# Patient Record
Sex: Male | Born: 1965 | Hispanic: No | Smoking: Current every day smoker
Health system: Southern US, Community
[De-identification: ages and names within clinical notes are randomized; demographics above are authoritative.]

## PROBLEM LIST (undated history)

## (undated) DIAGNOSIS — E785 Hyperlipidemia, unspecified: Secondary | ICD-10-CM

## (undated) DIAGNOSIS — K7460 Unspecified cirrhosis of liver: Secondary | ICD-10-CM

## (undated) DIAGNOSIS — F1020 Alcohol dependence, uncomplicated: Secondary | ICD-10-CM

## (undated) HISTORY — PX: NO PAST SURGERIES: SHX2092

## (undated) HISTORY — DX: Unspecified cirrhosis of liver: K74.60

---

## 2010-10-19 ENCOUNTER — Inpatient Hospital Stay (HOSPITAL_COMMUNITY)
Admission: EM | Admit: 2010-10-19 | Discharge: 2010-10-23 | DRG: 897 | Disposition: A | Discharge status: Home / Self Care | Payer: Self-pay | Attending: Internal Medicine | Admitting: Internal Medicine

## 2010-10-19 ENCOUNTER — Emergency Department (HOSPITAL_COMMUNITY): Payer: Self-pay

## 2010-10-19 DIAGNOSIS — R7402 Elevation of levels of lactic acid dehydrogenase (LDH): Secondary | ICD-10-CM | POA: Diagnosis present

## 2010-10-19 DIAGNOSIS — R569 Unspecified convulsions: Secondary | ICD-10-CM | POA: Diagnosis present

## 2010-10-19 DIAGNOSIS — E876 Hypokalemia: Secondary | ICD-10-CM | POA: Diagnosis present

## 2010-10-19 DIAGNOSIS — F10239 Alcohol dependence with withdrawal, unspecified: Principal | ICD-10-CM | POA: Diagnosis present

## 2010-10-19 DIAGNOSIS — R7401 Elevation of levels of liver transaminase levels: Secondary | ICD-10-CM | POA: Diagnosis present

## 2010-10-19 DIAGNOSIS — E871 Hypo-osmolality and hyponatremia: Secondary | ICD-10-CM | POA: Diagnosis present

## 2010-10-19 DIAGNOSIS — F10939 Alcohol use, unspecified with withdrawal, unspecified: Principal | ICD-10-CM | POA: Diagnosis present

## 2010-10-19 DIAGNOSIS — R000 Tachycardia, unspecified: Secondary | ICD-10-CM | POA: Diagnosis present

## 2010-10-19 DIAGNOSIS — F1020 Alcohol dependence, uncomplicated: Secondary | ICD-10-CM | POA: Diagnosis present

## 2010-10-19 DIAGNOSIS — F17200 Nicotine dependence, unspecified, uncomplicated: Secondary | ICD-10-CM | POA: Diagnosis present

## 2010-10-19 LAB — DIFFERENTIAL
Basophils Absolute: 0 10*3/uL (ref 0.0–0.1)
Eosinophils Absolute: 0.1 10*3/uL (ref 0.0–0.7)
Eosinophils Relative: 1 % (ref 0–5)
Lymphs Abs: 1.3 10*3/uL (ref 0.7–4.0)
Neutrophils Relative %: 66 % (ref 43–77)

## 2010-10-19 LAB — COMPREHENSIVE METABOLIC PANEL
ALT: 70 U/L — ABNORMAL HIGH (ref 0–53)
AST: 63 U/L — ABNORMAL HIGH (ref 0–37)
Albumin: 4.1 g/dL (ref 3.5–5.2)
Alkaline Phosphatase: 63 U/L (ref 39–117)
CO2: 29 mEq/L (ref 19–32)
Chloride: 82 mEq/L — ABNORMAL LOW (ref 96–112)
GFR calc non Af Amer: >60 mL/min (ref >60)
Potassium: 3.2 mEq/L — ABNORMAL LOW (ref 3.5–5.1)
Sodium: 125 mEq/L — ABNORMAL LOW (ref 135–145)
Total Bilirubin: 0.9 mg/dL (ref 0.3–1.2)

## 2010-10-19 LAB — RAPID URINE DRUG SCREEN, HOSP PERFORMED
Amphetamines: NOT DETECTED
Barbiturates: NOT DETECTED
Opiates: NOT DETECTED
Tetrahydrocannabinol: NOT DETECTED

## 2010-10-19 LAB — CBC
MCV: 90.3 fL (ref 78.0–100.0)
Platelets: 176 10*3/uL (ref 150–400)
RBC: 4.34 MIL/uL (ref 4.22–5.81)
RDW: 13 % (ref 11.5–15.5)
WBC: 8.2 10*3/uL (ref 4.0–10.5)

## 2010-10-20 LAB — CBC
HCT: 40.3 % (ref 39.0–52.0)
MCH: 32.6 pg (ref 26.0–34.0)
MCV: 91.2 fL (ref 78.0–100.0)
RDW: 13.4 % (ref 11.5–15.5)
WBC: 7.6 10*3/uL (ref 4.0–10.5)

## 2010-10-20 LAB — OSMOLALITY: Osmolality: 255 mOsm/kg — ABNORMAL LOW (ref 275–300)

## 2010-10-20 LAB — COMPREHENSIVE METABOLIC PANEL
Albumin: 3.6 g/dL (ref 3.5–5.2)
BUN: 7 mg/dL (ref 6–23)
CO2: 28 mEq/L (ref 19–32)
Calcium: 9.6 mg/dL (ref 8.4–10.5)
Chloride: 95 mEq/L — ABNORMAL LOW (ref 96–112)
Creatinine, Ser: 0.58 mg/dL (ref 0.50–1.35)
GFR calc non Af Amer: >60 mL/min (ref >60)
Total Bilirubin: 0.7 mg/dL (ref 0.3–1.2)

## 2010-10-20 LAB — OSMOLALITY, URINE: Osmolality, Ur: 575 mOsm/kg (ref 390–1090)

## 2010-10-20 LAB — TSH: TSH: 0.701 u[IU]/mL (ref 0.350–4.500)

## 2010-10-20 LAB — LIPID PANEL: Cholesterol: 294 mg/dL — ABNORMAL HIGH (ref 0–200)

## 2010-10-20 LAB — MAGNESIUM: Magnesium: 2.3 mg/dL (ref 1.5–2.5)

## 2010-10-20 LAB — SODIUM: Sodium: 123 mEq/L — ABNORMAL LOW (ref 135–145)

## 2010-10-21 ENCOUNTER — Inpatient Hospital Stay (HOSPITAL_COMMUNITY): Payer: Self-pay

## 2010-10-21 LAB — COMPREHENSIVE METABOLIC PANEL
BUN: 6 mg/dL (ref 6–23)
CO2: 28 mEq/L (ref 19–32)
Chloride: 102 mEq/L (ref 96–112)
Creatinine, Ser: 0.54 mg/dL (ref 0.50–1.35)
GFR calc Af Amer: >60 mL/min (ref >60)
GFR calc non Af Amer: >60 mL/min (ref >60)
Glucose, Bld: 97 mg/dL (ref 70–99)
Total Bilirubin: 0.7 mg/dL (ref 0.3–1.2)

## 2010-10-21 LAB — CBC
HCT: 39.1 % (ref 39.0–52.0)
MCV: 94 fL (ref 78.0–100.0)
RBC: 4.16 MIL/uL — ABNORMAL LOW (ref 4.22–5.81)
WBC: 6.4 10*3/uL (ref 4.0–10.5)

## 2010-10-22 LAB — COMPREHENSIVE METABOLIC PANEL
ALT: 74 U/L — ABNORMAL HIGH (ref 0–53)
AST: 106 U/L — ABNORMAL HIGH (ref 0–37)
CO2: 30 mEq/L (ref 19–32)
Calcium: 9.9 mg/dL (ref 8.4–10.5)
Chloride: 98 mEq/L (ref 96–112)
GFR calc non Af Amer: >60 mL/min (ref >60)
Sodium: 136 mEq/L (ref 135–145)

## 2010-10-22 LAB — CBC
MCH: 31.8 pg (ref 26.0–34.0)
Platelets: 176 10*3/uL (ref 150–400)
RBC: 4.21 MIL/uL — ABNORMAL LOW (ref 4.22–5.81)
WBC: 7.4 10*3/uL (ref 4.0–10.5)

## 2010-10-23 LAB — HEPATITIS PANEL, ACUTE
Hep B C IgM: NEGATIVE
Hepatitis B Surface Ag: NEGATIVE

## 2010-11-07 NOTE — Discharge Summary (Signed)
NAMECHANSON, TEEMS NO.:  [id number]  MEDICAL RECORD NO.:  [id number]  LOCATION:  1503                         FACILITY:  [facility]  PHYSICIAN:  Kathlen Mody, MD       DATE OF BIRTH:  04/04/1965  DATE OF ADMISSION:  10/19/2010 DATE OF DISCHARGE:  10/23/2010                              DISCHARGE SUMMARY   PRIMARY CARE PHYSICIAN:  He had been assigned to an MD at [facility].  DISCHARGE DIAGNOSES: 1. Alcohol withdrawal. 2. 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 3. Hyponatremia. 4. Hypokalemia. 5. History of hyperlipidemia. 6. Tobacco abuse. 7. Alcohol abuse.  DISCHARGE MEDICATIONS: 1. Folic acid 1 tablet daily. 2. Lorazepam one 1 mg twice daily. 3. Multivitamin 1 tablet daily. 4. Nicotine patch. 5. Vitamin B1 1 tablet daily. 6. Ibuprofen as needed.  CONSULTATIONS:  None.  PERTINENT LABS:  On admission, the patient had a CBC done which was within normal limits.  Comprehensive metabolic panel was significant for sodium of 125, potassium of 3.2, glucose 146, and elevated AST and ALT. Alcohol level was less than 11.  Urine drug screen was negative.  Urine sodium was 123.  Serum osmolality 255.  TSH within normal limits.  MRSA screen negative.  Urine osmolality was 575.  Lipid profile showed an elevated LDL of 198. Hepatitis panel was negative.  Comprehensive metabolic panel on the day of discharge showed an elevated AST of 106 and ALT of 74.  CBC was within normal limits.  DIAGNOSTIC STUDIES:  The patient had a CT head without contrast and showed no significant intracranial abnormality.  Ultrasound of the abdomen showed fatty liver infiltration.  BRIEF HOSPITAL COURSE:  This is a 45 year old gentleman with a remote history of hyperlipidemia, who was admitted to the hospital for seizure activity. 1. Seizures, most likely secondary to alcohol withdrawal symptoms.     The patient was admitted to step down.  He was put on CIWA protocol     and his  electrolytes were repleted as needed.  He was also educated     about being noncompliant to alcohol.  A social worker consult was     called to provide resources for Alcohol Anonymous program. 2. Hyponatremia, resolved. 3. Hypokalemia, resolved. 4. Hyperlipidemia.  He had an elevated LDL of 195, but since his LFTs     were elevated, he was not started on any statins at this time.  He     was recommended to follow up with his PCP in about 2-3 weeks and     check his LFTs and start statins as needed. 5. Tobacco abuse.  He was educated about tobacco cessation, given a     nicotine patch during hospitalization and on discharge.  On the day of discharge, the patient's vitals were within normal limits. His exam was within normal limits.  He was discharged home and recommended to follow up with his PCP in about 2-3 weeks and also get LFTs at the time of the office visit.          ______________________________ Kathlen Mody, MD     VA/MEDQ  D:  11/06/2010  T:  11/07/2010  Job:  [id number]  Electronically Signed by Kathlen Mody MD on 11/07/2010  02:07:30 PM

## 2010-12-07 NOTE — H&P (Signed)
[name], [name] NO.:  [id number]  MEDICAL RECORD NO.:  [id number]  LOCATION:  WLED                         FACILITY:  [facility]  PHYSICIAN:  Baltazar Najjar, MD     DATE OF BIRTH:  1965-05-31  DATE OF ADMISSION:  10/19/2010 DATE OF DISCHARGE:                             HISTORY & PHYSICAL   PRIMARY CARE PHYSICIAN:  The patient is unassigned.  CODE STATUS:  Full code.  CHIEF COMPLAINT:  Seizure.  HISTORY OF PRESENT ILLNESS:  [name] is a 45 year old [place] man with no significant past medical history, who is a heavy EtOH drinker, who was brought into the ER today by his wife after he had an episode of seizure activity at home which lasted about 15 minutes.  She described it as generalized tonic-clonic associated with foaming.  No bowel or urinary incontinence.  However, he bit his tongue as per her.  No history of seizures in the past; however, the patient is a heavy drinker.  He used to drink one pint of vodka every day.  For the last couple of days, he had been cutting down on his drinking and he took only a couple of beers today which is nothing for him.  He started having shaking and then progressing to seizure activity which was once at home.  No recurrence while he is in the ED.  The patient was found to be tachycardic in the ER, his heart rate was between 108 to 113 and he was in EtOH withdrawal.  He was placed on CIWA protocol and we were called to see him for admission and workup.  Other than that, there is no fever, no chills.  No cough or shortness of breath.  No abdominal pain.  No change in his bowel habits.  No dysuria, or any other complaints.  PAST MEDICAL HISTORY:  Remote history of hyperlipidemia, currently not on any medication.  SURGICAL HISTORY:  None.  SOCIAL HISTORY:  He is married, originally from [place].  Lives with his wife.  Drinks alcohol heavily, mainly vodka for at least 5 to 6 years. Smokes cigarettes about a pack a  day for the last 5 to 6 years.  Denies any illicit drug use.  ALLERGIES:  No known drug allergies.  HOME MEDICATIONS:  None.  REVIEW OF SYSTEMS:  As above in the HPI.  PHYSICAL EXAMINATION:  VITAL SIGNS:  Blood pressure 147/94, heart rate of 108, temperature 98.7, O2 sat 97% on room air. GENERAL:  He is alert and slightly drowsy after he was given Ativan in the ED, not in acute distress. NECK:  Supple.  No JVD. LUNGS:  Clear to auscultation bilaterally. CARDIOVASCULAR:  S1, S2, regular rhythm and rate. ABDOMEN:  Soft, nontender.  Bowel sounds heard normal. EXTREMITIES:  No pedal edema.  He does have tremors in his hands.  No active seizures.  LABORATORY AND RADIOLOGIC DATA:  Urine drug screen:  None detected. EtOH level less than 11.  Sodium 125, potassium 3.2, BUN of 7, creatinine 0.47.  Albumin 4.1, calcium 9.6.  WBCs 8.2, hemoglobin 13.8, hematocrit 39.2, platelets 176. CT head showed no significant intracranial abnormality.  ASSESSMENT: 1. Alcohol withdrawal. 2. Seizure secondary to #1. 3. Hyponatremia.  4. Hypokalemia. 5. Remote history of hyperlipidemia. 6. Tobacco abuse.  PLAN: 1. The patient will be admitted to step-down unit for close     monitoring. 2. We will continue with CIWA protocol.  Replete electrolytes, monitor     magnesium and potassium level very closely and repeat as needed. 3. Social Worker will be consulted to provide him resources for     [employer]. 4. Will place him on nicotine patch and counsel on smoking cessation. 5. Check urine sodium, urine osmolality, serum osmolality.  Also will     check his TSH as well to workup his hyponatremia and will replete     his potassium. 6. Given his remote history of hyperlipidemia, will also order a lipid     panel. 7. The patient will need PCP assignment prior to discharge.          ______________________________ Baltazar Najjar, MD     SA/MEDQ  D:  10/19/2010  T:  10/19/2010   Job:  [id number]  Electronically Signed by Hannah Beat MD on 12/07/2010 07:17:17 PM

## 2011-03-14 ENCOUNTER — Inpatient Hospital Stay (HOSPITAL_COMMUNITY)
Admission: EM | Admit: 2011-03-14 | Discharge: 2011-03-17 | DRG: 896 | Disposition: A | Discharge status: Home / Self Care | Payer: Self-pay | Attending: Internal Medicine | Admitting: Internal Medicine

## 2011-03-14 ENCOUNTER — Encounter (HOSPITAL_COMMUNITY): Payer: Self-pay

## 2011-03-14 DIAGNOSIS — F17200 Nicotine dependence, unspecified, uncomplicated: Secondary | ICD-10-CM | POA: Diagnosis present

## 2011-03-14 DIAGNOSIS — R05 Cough: Secondary | ICD-10-CM | POA: Diagnosis not present

## 2011-03-14 DIAGNOSIS — R4182 Altered mental status, unspecified: Secondary | ICD-10-CM | POA: Diagnosis present

## 2011-03-14 DIAGNOSIS — G9340 Encephalopathy, unspecified: Secondary | ICD-10-CM

## 2011-03-14 DIAGNOSIS — R509 Fever, unspecified: Secondary | ICD-10-CM | POA: Diagnosis not present

## 2011-03-14 DIAGNOSIS — E876 Hypokalemia: Secondary | ICD-10-CM | POA: Diagnosis present

## 2011-03-14 DIAGNOSIS — Z720 Tobacco use: Secondary | ICD-10-CM

## 2011-03-14 DIAGNOSIS — F10239 Alcohol dependence with withdrawal, unspecified: Principal | ICD-10-CM | POA: Diagnosis present

## 2011-03-14 DIAGNOSIS — F22 Delusional disorders: Secondary | ICD-10-CM | POA: Diagnosis present

## 2011-03-14 DIAGNOSIS — F10939 Alcohol use, unspecified with withdrawal, unspecified: Principal | ICD-10-CM | POA: Diagnosis present

## 2011-03-14 DIAGNOSIS — F1020 Alcohol dependence, uncomplicated: Secondary | ICD-10-CM | POA: Diagnosis present

## 2011-03-14 DIAGNOSIS — F1011 Alcohol abuse, in remission: Secondary | ICD-10-CM

## 2011-03-14 DIAGNOSIS — R059 Cough, unspecified: Secondary | ICD-10-CM | POA: Diagnosis not present

## 2011-03-14 DIAGNOSIS — E785 Hyperlipidemia, unspecified: Secondary | ICD-10-CM | POA: Diagnosis present

## 2011-03-14 DIAGNOSIS — G9349 Other encephalopathy: Secondary | ICD-10-CM | POA: Diagnosis present

## 2011-03-14 HISTORY — DX: Alcohol dependence, uncomplicated: F10.20

## 2011-03-14 HISTORY — DX: Hyperlipidemia, unspecified: E78.5

## 2011-03-14 LAB — CBC
HCT: 39.7 % (ref 39.0–52.0)
Hemoglobin: 13.9 g/dL (ref 13.0–17.0)
RDW: 13.6 % (ref 11.5–15.5)
WBC: 5.9 10*3/uL (ref 4.0–10.5)

## 2011-03-14 LAB — AMMONIA: Ammonia: 26 umol/L (ref 11–60)

## 2011-03-14 MED ORDER — SODIUM CHLORIDE 0.9 % IV BOLUS (SEPSIS)
1000.0000 mL | Freq: Once | INTRAVENOUS | Status: AC
  Administered 2011-03-14: 1000 mL via INTRAVENOUS

## 2011-03-14 NOTE — ED Notes (Signed)
Pt was seen here in last few months w/ETOH withdrawal.  Apparently his prior information was listed under a social security number that was off by a number.   Pt states he has been very sleep deprived.   Family states he is seeing and hearing things that aren't there.

## 2011-03-14 NOTE — ED Notes (Signed)
Rectal temperature was not done, pt declined.

## 2011-03-14 NOTE — ED Notes (Signed)
Family will remain w/pt until he is back to a room.  Pt in NAD and is calm and cooperative at this time.

## 2011-03-15 ENCOUNTER — Encounter (HOSPITAL_COMMUNITY): Payer: Self-pay | Admitting: Family Medicine

## 2011-03-15 ENCOUNTER — Emergency Department (HOSPITAL_COMMUNITY): Payer: Self-pay

## 2011-03-15 DIAGNOSIS — F10239 Alcohol dependence with withdrawal, unspecified: Principal | ICD-10-CM | POA: Diagnosis present

## 2011-03-15 DIAGNOSIS — F10939 Alcohol use, unspecified with withdrawal, unspecified: Principal | ICD-10-CM | POA: Diagnosis present

## 2011-03-15 DIAGNOSIS — Z720 Tobacco use: Secondary | ICD-10-CM

## 2011-03-15 LAB — RAPID URINE DRUG SCREEN, HOSP PERFORMED
Amphetamines: NOT DETECTED
Barbiturates: NOT DETECTED
Benzodiazepines: NOT DETECTED
Tetrahydrocannabinol: NOT DETECTED

## 2011-03-15 LAB — BASIC METABOLIC PANEL
CO2: 26 mEq/L (ref 19–32)
Chloride: 102 mEq/L (ref 96–112)
Creatinine, Ser: 0.63 mg/dL (ref 0.50–1.35)
Glucose, Bld: 96 mg/dL (ref 70–99)

## 2011-03-15 LAB — COMPREHENSIVE METABOLIC PANEL
Albumin: 4.1 g/dL (ref 3.5–5.2)
Alkaline Phosphatase: 67 U/L (ref 39–117)
BUN: 9 mg/dL (ref 6–23)
Chloride: 94 mEq/L — ABNORMAL LOW (ref 96–112)
Potassium: 2.4 mEq/L — CL (ref 3.5–5.1)
Total Bilirubin: 0.6 mg/dL (ref 0.3–1.2)

## 2011-03-15 LAB — CBC
HCT: 38.4 % — ABNORMAL LOW (ref 39.0–52.0)
MCV: 88.7 fL (ref 78.0–100.0)
RDW: 13.6 % (ref 11.5–15.5)
WBC: 5.3 10*3/uL (ref 4.0–10.5)

## 2011-03-15 LAB — URINALYSIS, ROUTINE W REFLEX MICROSCOPIC
Bilirubin Urine: NEGATIVE
Glucose, UA: NEGATIVE mg/dL
Hgb urine dipstick: NEGATIVE
pH: 6.5 (ref 5.0–8.0)

## 2011-03-15 LAB — ETHANOL: Alcohol, Ethyl (B): <11 mg/dL (ref 0–11)

## 2011-03-15 MED ORDER — LORAZEPAM 2 MG/ML IJ SOLN
INTRAMUSCULAR | Status: AC
  Administered 2011-03-15: 3 mg via INTRAVENOUS
  Filled 2011-03-15: qty 2

## 2011-03-15 MED ORDER — ADULT MULTIVITAMIN W/MINERALS CH
1.0000 | ORAL_TABLET | Freq: Every day | ORAL | Status: DC
  Administered 2011-03-15 – 2011-03-17 (×3): 1 via ORAL
  Filled 2011-03-15 (×3): qty 1

## 2011-03-15 MED ORDER — PNEUMOCOCCAL VAC POLYVALENT 25 MCG/0.5ML IJ INJ
0.5000 mL | INJECTION | INTRAMUSCULAR | Status: AC
  Administered 2011-03-16: 0.5 mL via INTRAMUSCULAR
  Filled 2011-03-15: qty 0.5

## 2011-03-15 MED ORDER — LORAZEPAM 2 MG/ML IJ SOLN
3.0000 mg | Freq: Once | INTRAMUSCULAR | Status: AC
  Administered 2011-03-15: 3 mg via INTRAVENOUS

## 2011-03-15 MED ORDER — INFLUENZA VIRUS VACC SPLIT PF IM SUSP
0.5000 mL | INTRAMUSCULAR | Status: AC
  Administered 2011-03-16: 0.5 mL via INTRAMUSCULAR
  Filled 2011-03-15: qty 0.5

## 2011-03-15 MED ORDER — POTASSIUM CHLORIDE CRYS ER 20 MEQ PO TBCR
40.0000 meq | EXTENDED_RELEASE_TABLET | Freq: Once | ORAL | Status: AC
  Administered 2011-03-15: 40 meq via ORAL
  Filled 2011-03-15: qty 2

## 2011-03-15 MED ORDER — LORAZEPAM 1 MG PO TABS
1.0000 mg | ORAL_TABLET | Freq: Four times a day (QID) | ORAL | Status: DC | PRN
  Administered 2011-03-15: 1 mg via ORAL
  Filled 2011-03-15: qty 1

## 2011-03-15 MED ORDER — VITAMIN B-1 100 MG PO TABS
100.0000 mg | ORAL_TABLET | Freq: Every day | ORAL | Status: DC
  Administered 2011-03-15 – 2011-03-17 (×3): 100 mg via ORAL
  Filled 2011-03-15 (×3): qty 1

## 2011-03-15 MED ORDER — CHLORDIAZEPOXIDE HCL 25 MG PO CAPS
25.0000 mg | ORAL_CAPSULE | Freq: Three times a day (TID) | ORAL | Status: DC
  Administered 2011-03-15 – 2011-03-17 (×8): 25 mg via ORAL
  Filled 2011-03-15 (×7): qty 1

## 2011-03-15 MED ORDER — LORAZEPAM 2 MG/ML IJ SOLN
2.0000 mg | Freq: Once | INTRAMUSCULAR | Status: AC
  Administered 2011-03-15: 2 mg via INTRAVENOUS

## 2011-03-15 MED ORDER — LORAZEPAM 2 MG/ML IJ SOLN
1.0000 mg | Freq: Four times a day (QID) | INTRAMUSCULAR | Status: DC | PRN
  Administered 2011-03-16: 1 mg via INTRAVENOUS
  Filled 2011-03-15 (×2): qty 1

## 2011-03-15 MED ORDER — MAGNESIUM SULFATE IN D5W 10-5 MG/ML-% IV SOLN
1.0000 g | Freq: Once | INTRAVENOUS | Status: AC
  Administered 2011-03-15: 1 g via INTRAVENOUS
  Filled 2011-03-15: qty 100

## 2011-03-15 MED ORDER — THIAMINE HCL 100 MG/ML IJ SOLN
100.0000 mg | Freq: Every day | INTRAMUSCULAR | Status: DC
  Filled 2011-03-15 (×2): qty 2

## 2011-03-15 MED ORDER — LORAZEPAM 1 MG PO TABS
1.0000 mg | ORAL_TABLET | Freq: Once | ORAL | Status: AC
  Administered 2011-03-15: 1 mg via ORAL
  Filled 2011-03-15: qty 1

## 2011-03-15 MED ORDER — FOLIC ACID 1 MG PO TABS
1.0000 mg | ORAL_TABLET | Freq: Every day | ORAL | Status: DC
  Administered 2011-03-15 – 2011-03-17 (×3): 1 mg via ORAL
  Filled 2011-03-15 (×3): qty 1

## 2011-03-15 MED ORDER — CHLORDIAZEPOXIDE HCL 25 MG PO CAPS
ORAL_CAPSULE | ORAL | Status: AC
  Filled 2011-03-15: qty 1

## 2011-03-15 MED ORDER — NICOTINE 14 MG/24HR TD PT24
14.0000 mg | MEDICATED_PATCH | Freq: Every day | TRANSDERMAL | Status: DC
  Administered 2011-03-15 – 2011-03-17 (×5): 14 mg via TRANSDERMAL
  Filled 2011-03-15 (×4): qty 1

## 2011-03-15 MED ORDER — LORAZEPAM 1 MG PO TABS
0.0000 mg | ORAL_TABLET | Freq: Four times a day (QID) | ORAL | Status: AC
  Administered 2011-03-15: 1 mg via ORAL
  Filled 2011-03-15 (×2): qty 1

## 2011-03-15 MED ORDER — LORAZEPAM 1 MG PO TABS: 0.0000 mg | ORAL_TABLET | Freq: Two times a day (BID) | ORAL | Status: DC

## 2011-03-15 MED ORDER — LORAZEPAM 2 MG/ML IJ SOLN
INTRAMUSCULAR | Status: AC
  Filled 2011-03-15: qty 1

## 2011-03-15 MED ORDER — ENOXAPARIN SODIUM 40 MG/0.4ML ~~LOC~~ SOLN
40.0000 mg | SUBCUTANEOUS | Status: DC
  Administered 2011-03-15 – 2011-03-17 (×3): 40 mg via SUBCUTANEOUS
  Filled 2011-03-15 (×3): qty 0.4

## 2011-03-15 NOTE — H&P (Signed)
PCP:   [name], MD, MD   Chief Complaint:  hallucination  HPI: A 46 year old gentleman who presents with auditory and visual hallucinations. Per his sister and mother who are at the bedside, this is been on going for approximately 2 days. He also has the tremors and some confusion. The patient has had the flu for the past 5 days, there is no report of any new medications. The patient was admitted here in August 2012 with a diagnosis of seizure secondary to alcohol abuse. At that point he admitted to vodka daily intake, currently the patient states he drinks only a few beers daily and he has not drank any in the last 5 days.  I was later told by nursing that his wife states he still drinks lots of vodka daily. There appears to be some family dynamic were she's not to allowed to say this to the patient or in front of his family. The family apparently has approached the nurses a several times and asking them not to say much about his alcohol intake.   Review of Systems: Positives bolded  anorexia, fever, weight loss,, vision loss, decreased hearing, hoarseness, chest pain, syncope, dyspnea on exertion, peripheral edema, balance deficits, hemoptysis, abdominal pain, melena, hematochezia, severe indigestion/heartburn, hematuria, incontinence, genital sores, muscle weakness, suspicious skin lesions, transient blindness, difficulty walking, depression, unusual weight change, abnormal bleeding, enlarged lymph nodes, angioedema, and breast masses.  Past Medical History: Past Medical History  Diagnosis Date  . Alcoholism   . Hyperlipemia    No past surgical history on file.  Medications: Prior to Admission medications   Not on File  none  Allergies:  No Known Allergies  Social History:  reports that he has been smoking.  He does not have any smokeless tobacco history on file. He reports that he drinks alcohol. He reports that he does not use illicit drugs.  Family History: Family  History  Problem Relation Age of Onset  . Coronary artery disease    . Diabetes type II      Physical Exam: Filed Vitals:   03/14/11 1834  BP: 141/93  Pulse: 107  Temp: 98.8 F (37.1 C)  TempSrc: Oral  Resp: 16  Height: 5\' 6"  (1.676 m)  Weight: 66.225 kg (146 lb)  SpO2: 100%    General:  Alert and oriented times three, well developed and nourished, no acute distress Eyes: PERRLA, pink conjunctiva, no scleral icterus ENT: Moist oral mucosa, neck supple, no thyromegaly Lungs: clear to ascultation, no wheeze, no crackles, no use of accessory muscles Cardiovascular: regular rate and rhythm, no regurgitation, no gallops, no murmurs. No carotid bruits, no JVD Abdomen: soft, positive BS, non-tender, non-distended, no organomegaly, not an acute abdomen GU: not examined Neuro: CN II - XII grossly intact, sensation intact Musculoskeletal: strength 5/5 all extremities, no clubbing, cyanosis or edema Skin: no rash, no subcutaneous crepitation, no decubitus Psych: alert and oriented but not focused   Labs on Admission:   [facility] 03/14/11 2322  NA 133*  K 2.4*  CL 94*  CO2 26  GLUCOSE 97  BUN 9  CREATININE 0.63  CALCIUM 9.6  MG --  PHOS --    Basename 03/14/11 2322  AST 69*  ALT 97*  ALKPHOS 67  BILITOT 0.6  PROT 7.7  ALBUMIN 4.1   No results found for this basename: LIPASE:2,AMYLASE:2 in the last 72 hours  Basename 03/14/11 2322  WBC 5.9  NEUTROABS --  HGB 13.9  HCT 39.7  MCV 88.0  PLT 173  Results for MUNEEB, VERAS (MRN [id number]) as of 03/15/2011 02:00  Ref. Range 10/19/2010 16:45 03/14/2011 23:22 03/15/2011 00:36  Alcohol, Ethyl (B) Latest Range: 0-11 mg/dL <40 <98   Amphetamines Latest Range: NONE DETECTED    NONE DETECTED  Barbiturates Latest Range: NONE DETECTED    NONE DETECTED  Benzodiazepines Latest Range: NONE DETECTED    NONE DETECTED  Opiates Latest Range: NONE DETECTED    NONE DETECTED  COCAINE Latest Range: NONE DETECTED    NONE DETECTED    Tetrahydrocannabinol Latest Range: NONE DETECTED    NONE DETECTED   No results found for this basename: CKTOTAL:3,CKMB:3,CKMBINDEX:3,TROPONINI:3 in the last 72 hours No components found with this basename: POCBNP:3 No results found for this basename: DDIMER:2 in the last 72 hours No results found for this basename: HGBA1C:2 in the last 72 hours No results found for this basename: CHOL:2,HDL:2,LDLCALC:2,TRIG:2,CHOLHDL:2,LDLDIRECT:2 in the last 72 hours No results found for this basename: TSH,T4TOTAL,FREET3,T3FREE,THYROIDAB in the last 72 hours No results found for this basename: VITAMINB12:2,FOLATE:2,FERRITIN:2,TIBC:2,IRON:2,RETICCTPCT:2 in the last 72 hours  Micro Results: No results found for this or any previous visit (from the past 240 hour(s)). Results for AERO, DRUMMONDS (MRN [id number]) as of 03/15/2011 02:00  Ref. Range 03/15/2011 00:36  Color, Urine Latest Range: YELLOW  YELLOW  APPearance Latest Range: CLEAR  CLEAR  Specific Gravity, Urine Latest Range: 1.005-1.030  1.007  pH Latest Range: 5.0-8.0  6.5  Glucose, UA Latest Range: NEGATIVE mg/dL NEGATIVE  Bilirubin Urine Latest Range: NEGATIVE  NEGATIVE  Ketones, ur Latest Range: NEGATIVE mg/dL TRACE (A)  Protein Latest Range: NEGATIVE mg/dL NEGATIVE  Urobilinogen, UA Latest Range: 0.0-1.0 mg/dL 1.0  Nitrite Latest Range: NEGATIVE  NEGATIVE  Leukocytes, UA Latest Range: NEGATIVE  NEGATIVE    Radiological Exams on Admission: No results found.  Assessment/Plan Present on Admission:  .Alcohol withdrawal ALCOHOL abuse  Admit to telemetry CIWA  protocol CT head per family request  Hyperlipidemia    full code DVT prophylaxis Team 2/Dr. Oleh Genin, Beverlee Wilmarth 03/15/2011, 1:31 AM

## 2011-03-15 NOTE — ED Notes (Signed)
Pt remains very agitated after 3mg  Ativan IV, obtained verbal order from Dr. Joneen Roach for 2mg  of Ativan IV.

## 2011-03-15 NOTE — ED Notes (Signed)
Unable to get morning labs at this time. Will get labs when able. RN aware

## 2011-03-15 NOTE — ED Provider Notes (Signed)
History     CSN: [id number]  Arrival date & time 03/14/11  4098   First MD Initiated Contact with Patient 03/14/11 2257      Chief Complaint  Patient presents with  . Delusional    x few days??  per the family. pt lives w/his wife who is at home w/small child who claims the same.  . Altered Mental Status    pt is alert to self but needs slight reorienting to place and time.      (Consider location/radiation/quality/duration/timing/severity/associated sxs/prior treatment) Patient is a 46 y.o. male presenting with altered mental status. The history is provided by the patient, the spouse and a relative.  Altered Mental Status Pertinent negatives include no chest pain, no abdominal pain, no headaches and no shortness of breath.   about 5 days ago developed nausea vomiting and diarrhea with associated fever. The symptoms resolved about 24 hours ago and he has since developed altered mental status with reported visual hallucinations. Patient has a strong history of alcohol use and has not had a drink in about 4 days. He has a history of a seizure from alcohol withdrawal a few months ago but denies any recent seizure activity. His wife bedside states that she has been with him continuously for last 4 days. Patient abdominal pain, chest pain or shortness of breath. No sick contacts. Symptoms moderate in severity. History of same with alcohol withdrawal. Symptoms continuous since onset and unchanged.  Past Medical History  Diagnosis Date  . Alcoholism     No past surgical history on file.  No family history on file.  History  Substance Use Topics  . Smoking status: Current Everyday Smoker -- 0.5 packs/day  . Smokeless tobacco: Not on file  . Alcohol Use: No     pt states last drink was 3 days ago      Review of Systems  Constitutional: Negative for fever and chills.  HENT: Negative for neck pain and neck stiffness.   Eyes: Negative for pain.  Respiratory: Negative for shortness  of breath.   Cardiovascular: Negative for chest pain, palpitations and leg swelling.  Gastrointestinal: Negative for abdominal pain.  Genitourinary: Negative for dysuria.  Musculoskeletal: Negative for back pain.  Skin: Negative for rash.  Neurological: Negative for seizures, speech difficulty and headaches.  Psychiatric/Behavioral: Positive for hallucinations and altered mental status.  All other systems reviewed and are negative.    Allergies  Review of patient's allergies indicates no known allergies.  Home Medications  No current outpatient prescriptions on file.  BP 141/93  Pulse 107  Temp(Src) 98.8 F (37.1 C) (Oral)  Resp 16  Ht 5\' 6"  (1.676 m)  Wt 146 lb (66.225 kg)  BMI 23.56 kg/m2  SpO2 100%  Physical Exam  Constitutional: He appears well-developed and well-nourished.  HENT:  Head: Normocephalic and atraumatic.  Eyes: Conjunctivae and EOM are normal. Pupils are equal, round, and reactive to light.  Neck: Trachea normal. Neck supple. No thyromegaly present.  Cardiovascular: Normal rate, regular rhythm, S1 normal, S2 normal and normal pulses.     No systolic murmur is present   No diastolic murmur is present  Pulses:      Radial pulses are 2+ on the right side, and 2+ on the left side.  Pulmonary/Chest: Effort normal and breath sounds normal. He has no wheezes. He has no rhonchi. He has no rales. He exhibits no tenderness.  Abdominal: Soft. Normal appearance and bowel sounds are normal. There is no tenderness.  There is no CVA tenderness and negative Murphy's sign.  Musculoskeletal:       BLE:s Calves nontender, no cords or erythema, negative Homans sign  Neurological: He is alert. He has normal strength. No cranial nerve deficit or sensory deficit. Coordination normal. GCS eye subscore is 4. GCS verbal subscore is 5. GCS motor subscore is 6.       No nystagmus. Mild upper extremity murmurs present  Skin: Skin is warm and dry. No rash noted. He is not diaphoretic.   Psychiatric: His speech is normal.    ED Course  Procedures (including critical care time)  Labs Reviewed  COMPREHENSIVE METABOLIC PANEL - Abnormal; Notable for the following:    Sodium 133 (*)    Potassium 2.4 (*)    Chloride 94 (*)    AST 69 (*)    ALT 97 (*)    All other components within normal limits  CBC  ETHANOL  AMMONIA  URINALYSIS, ROUTINE W REFLEX MICROSCOPIC  URINE RAPID DRUG SCREEN (HOSP PERFORMED)   IV fluids. Ativan. Labs obtained and reviewed as above. Potassium provided. Medicine consultation for hallucinations and altered mental status with history of alcohol withdrawal.. case discussed as above with triad hospitalist who agrees to evaluation and admit.   MDM   Hallucinations with history of alcohol withdrawal presentation concerning for the same. Medicine admit.        Sunnie Nielsen, MD 03/15/11 [phone]

## 2011-03-15 NOTE — Progress Notes (Signed)
At this time, we receive him from the E.D. With his mother in attendance.  He is able (with assistance for steadying) to ambulate to his bed.  He tells me he is comfortable, and his only request is for some cold water, which we obtain for him.  He states he is hungry, and we instruct him and his mother how to phone for his food preferences, which they complete.  He is oriented x 4 with some hesitancy in his verbal responses.  He is minimally irritable and shaky and is in no distress.

## 2011-03-15 NOTE — Progress Notes (Signed)
UR completed 

## 2011-03-15 NOTE — ED Notes (Signed)
Pt. Was notified that a UA was needed and Pt. Stated that he would try to void within the next 5 minutes and he would notify nursing staff.

## 2011-03-15 NOTE — Progress Notes (Signed)
[name] JYN:[id number] is a 46 y.o. male,  Outpatient Primary MD for the patient is [name], MD, MD  Chief Complaint  Patient presents with  . Delusional    x few days??  per the family. pt lives w/his wife who is at home w/small child who claims the same.  . Altered Mental Status    pt is alert to self but needs slight reorienting to place and time.          Subjective:   Ian Bazile today has, No headache, No chest pain, No abdominal pain - No Nausea, No new weakness tingling or numbness, No Cough - SOB.    Objective:   Filed Vitals:   03/15/11 0803 03/15/11 0935 03/15/11 1121 03/15/11 1335  BP: 118/52 126/78 121/75 116/75  Pulse: 100 109 111 102  Temp: 99 F (37.2 C)  99.3 F (37.4 C)   TempSrc: Oral  Oral   Resp: 17 14 16 16   Height:      Weight:      SpO2: 95% 96% 97%     Wt Readings from Last 3 Encounters:  03/14/11 66.225 kg (146 lb)    No intake or output data in the 24 hours ending 03/15/11 1445  Exam Awake Alert, Oriented *3, No new F.N deficits, Normal affect [place].AT,PERRAL Supple Neck,No JVD, No cervical lymphadenopathy appriciated.  Symmetrical Chest wall movement, Good air movement bilaterally, CTAB RRR,No Gallops,Rubs or new Murmurs, No Parasternal Heave +ve B.Sounds, Abd Soft, Non tender, No organomegaly appriciated, No rebound -guarding or rigidity. No Cyanosis, Clubbing or edema, No new Rash or bruise     Data Review  CBC  Lab 03/15/11 0605 03/14/11 2322  WBC 5.3 5.9  HGB 13.3 13.9  HCT 38.4* 39.7  PLT 147* 173  MCV 88.7 88.0  MCH 30.7 30.8  MCHC 34.6 35.0  RDW 13.6 13.6  LYMPHSABS -- --  MONOABS -- --  EOSABS -- --  BASOSABS -- --  BANDABS -- --    Chemistries   Lab 03/15/11 0605 03/14/11 2322  NA 136 133*  K 3.2* 2.4*  CL 102 94*  CO2 26 26  GLUCOSE 96 97  BUN 8 9  CREATININE 0.63 0.63  CALCIUM 8.9 9.6  MG 1.7 --  AST -- 69*  ALT -- 97*  ALKPHOS -- 67  BILITOT -- 0.6    ------------------------------------------------------------------------------------------------------------------ estimated creatinine clearance is 103 ml/min (by C-G formula based on Cr of 0.63). ------------------------------------------------------------------------------------------------------------------ No results found for this basename: HGBA1C:2 in the last 72 hours ------------------------------------------------------------------------------------------------------------------ No results found for this basename: CHOL:2,HDL:2,LDLCALC:2,TRIG:2,CHOLHDL:2,LDLDIRECT:2 in the last 72 hours ------------------------------------------------------------------------------------------------------------------ No results found for this basename: TSH,T4TOTAL,FREET3,T3FREE,THYROIDAB in the last 72 hours ------------------------------------------------------------------------------------------------------------------ No results found for this basename: VITAMINB12:2,FOLATE:2,FERRITIN:2,TIBC:2,IRON:2,RETICCTPCT:2 in the last 72 hours  Coagulation profile No results found for this basename: INR:5,PROTIME:5 in the last 168 hours  No results found for this basename: DDIMER:2 in the last 72 hours  Cardiac Enzymes No results found for this basename: CK:3,CKMB:3,TROPONINI:3,MYOGLOBIN:3 in the last 168 hours ------------------------------------------------------------------------------------------------------------------ No components found with this basename: POCBNP:3  Micro Results No results found for this or any previous visit (from the past 240 hour(s)).  Radiology Reports Ct Head Wo Contrast  03/15/2011  *RADIOLOGY REPORT*  Clinical Data: Altered mental status; hallucinations.  CT HEAD WITHOUT CONTRAST  Technique:  Contiguous axial images were obtained from the base of the skull through the vertex without contrast.  Comparison: CT of the head performed 10/19/2010  Findings: There is no evidence  of acute infarction, mass lesion, or intra- or extra-axial hemorrhage on CT.  The posterior fossa, including the cerebellum, brainstem and fourth ventricle, is within normal limits.  The third and lateral ventricles, and basal ganglia are unremarkable in appearance.  The cerebral hemispheres are symmetric in appearance, with normal gray- white differentiation.  No mass effect or midline shift is seen.  There is no evidence of fracture; visualized osseous structures are unremarkable in appearance.  The visualized portions of the orbits are within normal limits.  The paranasal sinuses and mastoid air cells are well-aerated.  No significant soft tissue abnormalities are seen.  IMPRESSION: Unremarkable noncontrast CT of the head.  Original Report Authenticated By: Tonia Ghent, M.D.    Scheduled Meds:   . chlordiazePOXIDE      . chlordiazePOXIDE  25 mg Oral TID  . enoxaparin  40 mg Subcutaneous Q24H  . folic acid  1 mg Oral Daily  . influenza  inactive virus vaccine  0.5 mL Intramuscular Tomorrow-1000  . LORazepam  2 mg Intravenous Once  . LORazepam  3 mg Intravenous Once  . LORazepam  0-4 mg Oral Q6H   Followed by  . LORazepam  0-4 mg Oral Q12H  . LORazepam  1 mg Oral Once  . magnesium sulfate 1 - 4 g bolus IVPB  1 g Intravenous Once  . mulitivitamin with minerals  1 tablet Oral Daily  . nicotine  14 mg Transdermal Daily  . pneumococcal 23 valent vaccine  0.5 mL Intramuscular Tomorrow-1000  . potassium chloride  40 mEq Oral Once  . potassium chloride  40 mEq Oral Once  . potassium chloride  40 mEq Oral Once  . sodium chloride  1,000 mL Intravenous Once  . thiamine  100 mg Oral Daily   Or  . thiamine  100 mg Intravenous Daily   Continuous Infusions:  PRN Meds:.LORazepam, LORazepam  Assessment & Plan   1. Alcohol withdrawal - stable on liibrium + CIWA protocol, CT head negative, symptom free continue Vitamins, counseled. Off day 4.   2. Low K replaced. Check in AM.   3. Tobacco  abuse - counseled.  Time spent 40 minutes in seeing, examining patient, and over half of the total time was spent in coordinating patient, mother & care on the floor or bedisde.   DVT Prophylaxis  Lovenox    See all Orders from today for further details    Leroy Sea M.D on 03/15/2011 at 2:45 PM  [facility] Office  [phone]

## 2011-03-15 NOTE — ED Notes (Addendum)
Pt began severely hallucinating, began pulling off leads and pulling at his IV. Dr. Joneen Roach notified, obtained verbal order for Ativan 3mg  IV

## 2011-03-16 ENCOUNTER — Inpatient Hospital Stay (HOSPITAL_COMMUNITY): Payer: Self-pay

## 2011-03-16 DIAGNOSIS — F1020 Alcohol dependence, uncomplicated: Secondary | ICD-10-CM

## 2011-03-16 DIAGNOSIS — F10239 Alcohol dependence with withdrawal, unspecified: Principal | ICD-10-CM

## 2011-03-16 LAB — BASIC METABOLIC PANEL
BUN: 8 mg/dL (ref 6–23)
Chloride: 97 mEq/L (ref 96–112)
GFR calc non Af Amer: >90 mL/min (ref >90)
Glucose, Bld: 113 mg/dL — ABNORMAL HIGH (ref 70–99)
Potassium: 3.2 mEq/L — ABNORMAL LOW (ref 3.5–5.1)

## 2011-03-16 LAB — CULTURE, BLOOD (ROUTINE X 2): Culture  Setup Time: 201301172053

## 2011-03-16 MED ORDER — LEVOFLOXACIN IN D5W 750 MG/150ML IV SOLN
750.0000 mg | INTRAVENOUS | Status: DC
  Administered 2011-03-16 – 2011-03-17 (×2): 750 mg via INTRAVENOUS
  Filled 2011-03-16 (×3): qty 150

## 2011-03-16 MED ORDER — GUAIFENESIN 100 MG/5ML PO SOLN
5.0000 mL | ORAL | Status: DC | PRN
  Administered 2011-03-16: 100 mg via ORAL

## 2011-03-16 MED ORDER — ACETAMINOPHEN 325 MG PO TABS
650.0000 mg | ORAL_TABLET | Freq: Three times a day (TID) | ORAL | Status: DC | PRN
  Administered 2011-03-16: 650 mg via ORAL
  Filled 2011-03-16: qty 2

## 2011-03-16 MED ORDER — ALBUTEROL SULFATE (5 MG/ML) 0.5% IN NEBU: 2.5000 mg | INHALATION_SOLUTION | RESPIRATORY_TRACT | Status: DC | PRN

## 2011-03-16 MED ORDER — GUAIFENESIN 100 MG/5ML PO SOLN
5.0000 mL | ORAL | Status: DC | PRN
  Filled 2011-03-16: qty 10

## 2011-03-16 NOTE — Consult Note (Signed)
Reason for Consult: Alcohol use and  delusional behavior Referring Physician: Dr. Melony Overly Hilgert is an 46 y.o. male.  HPI: Family members noticed that this patient was not saying things that made sense and he has been drinking alcohol.   Past Medical History  Diagnosis Date  . Alcoholism   . Hyperlipemia     Past Surgical History  Procedure Date  . No past surgeries     Family History  Problem Relation Age of Onset  . Coronary artery disease    . Diabetes type II      Social History:  reports that he has been smoking Cigarettes.  He has been smoking about .5 packs per day. He has never used smokeless tobacco. He reports that he drinks alcohol. He reports that he does not use illicit drugs.  Allergies: No Known Allergies  Medications: I have reviewed the patient's current medications.  Results for orders placed during the hospital encounter of 03/14/11 (from the past 48 hour(s))  CBC     Status: Normal   Collection Time   03/14/11 11:22 PM      Component Value Range Comment   WBC 5.9  4.0 - 10.5 (K/uL)    RBC 4.51  4.22 - 5.81 (MIL/uL)    Hemoglobin 13.9  13.0 - 17.0 (g/dL)    HCT 40.9  81.1 - 91.4 (%)    MCV 88.0  78.0 - 100.0 (fL)    MCH 30.8  26.0 - 34.0 (pg)    MCHC 35.0  30.0 - 36.0 (g/dL)    RDW 78.2  95.6 - 21.3 (%)    Platelets 173  150 - 400 (K/uL)   COMPREHENSIVE METABOLIC PANEL     Status: Abnormal   Collection Time   03/14/11 11:22 PM      Component Value Range Comment   Sodium 133 (*) 135 - 145 (mEq/L)    Potassium 2.4 (*) 3.5 - 5.1 (mEq/L)    Chloride 94 (*) 96 - 112 (mEq/L)    CO2 26  19 - 32 (mEq/L)    Glucose, Bld 97  70 - 99 (mg/dL)    BUN 9  6 - 23 (mg/dL)    Creatinine, Ser 0.86  0.50 - 1.35 (mg/dL)    Calcium 9.6  8.4 - 10.5 (mg/dL)    Total Protein 7.7  6.0 - 8.3 (g/dL)    Albumin 4.1  3.5 - 5.2 (g/dL)    AST 69 (*) 0 - 37 (U/L)    ALT 97 (*) 0 - 53 (U/L)    Alkaline Phosphatase 67  39 - 117 (U/L)    Total Bilirubin 0.6  0.3 -  1.2 (mg/dL)    GFR calc non Af Amer >90  >90 (mL/min)    GFR calc Af Amer >90  >90 (mL/min)   ETHANOL     Status: Normal   Collection Time   03/14/11 11:22 PM      Component Value Range Comment   Alcohol, Ethyl (B) <11  0 - 11 (mg/dL)   AMMONIA     Status: Normal   Collection Time   03/14/11 11:22 PM      Component Value Range Comment   Ammonia 26  11 - 60 (umol/L)   URINALYSIS, ROUTINE W REFLEX MICROSCOPIC     Status: Abnormal   Collection Time   03/15/11 12:36 AM      Component Value Range Comment   Color, Urine YELLOW  YELLOW  APPearance CLEAR  CLEAR     Specific Gravity, Urine 1.007  1.005 - 1.030     pH 6.5  5.0 - 8.0     Glucose, UA NEGATIVE  NEGATIVE (mg/dL)    Hgb urine dipstick NEGATIVE  NEGATIVE     Bilirubin Urine NEGATIVE  NEGATIVE     Ketones, ur TRACE (*) NEGATIVE (mg/dL)    Protein, ur NEGATIVE  NEGATIVE (mg/dL)    Urobilinogen, UA 1.0  0.0 - 1.0 (mg/dL)    Nitrite NEGATIVE  NEGATIVE     Leukocytes, UA NEGATIVE  NEGATIVE  MICROSCOPIC NOT DONE ON URINES WITH NEGATIVE PROTEIN, BLOOD, LEUKOCYTES, NITRITE, OR GLUCOSE <1000 mg/dL.  URINE RAPID DRUG SCREEN (HOSP PERFORMED)     Status: Normal   Collection Time   03/15/11 12:36 AM      Component Value Range Comment   Opiates NONE DETECTED  NONE DETECTED     Cocaine NONE DETECTED  NONE DETECTED     Benzodiazepines NONE DETECTED  NONE DETECTED     Amphetamines NONE DETECTED  NONE DETECTED     Tetrahydrocannabinol NONE DETECTED  NONE DETECTED     Barbiturates NONE DETECTED  NONE DETECTED    BASIC METABOLIC PANEL     Status: Abnormal   Collection Time   03/15/11  6:05 AM      Component Value Range Comment   Sodium 136  135 - 145 (mEq/L)    Potassium 3.2 (*) 3.5 - 5.1 (mEq/L)    Chloride 102  96 - 112 (mEq/L)    CO2 26  19 - 32 (mEq/L)    Glucose, Bld 96  70 - 99 (mg/dL)    BUN 8  6 - 23 (mg/dL)    Creatinine, Ser 1.61  0.50 - 1.35 (mg/dL)    Calcium 8.9  8.4 - 10.5 (mg/dL)    GFR calc non Af Amer >90  >90 (mL/min)     GFR calc Af Amer >90  >90 (mL/min)   CBC     Status: Abnormal   Collection Time   03/15/11  6:05 AM      Component Value Range Comment   WBC 5.3  4.0 - 10.5 (K/uL)    RBC 4.33  4.22 - 5.81 (MIL/uL)    Hemoglobin 13.3  13.0 - 17.0 (g/dL)    HCT 09.6 (*) 04.5 - 52.0 (%)    MCV 88.7  78.0 - 100.0 (fL)    MCH 30.7  26.0 - 34.0 (pg)    MCHC 34.6  30.0 - 36.0 (g/dL)    RDW 40.9  81.1 - 91.4 (%)    Platelets 147 (*) 150 - 400 (K/uL)   MAGNESIUM     Status: Normal   Collection Time   03/15/11  6:05 AM      Component Value Range Comment   Magnesium 1.7  1.5 - 2.5 (mg/dL)   BASIC METABOLIC PANEL     Status: Abnormal   Collection Time   03/16/11  5:18 AM      Component Value Range Comment   Sodium 134 (*) 135 - 145 (mEq/L)    Potassium 3.2 (*) 3.5 - 5.1 (mEq/L)    Chloride 97  96 - 112 (mEq/L)    CO2 25  19 - 32 (mEq/L)    Glucose, Bld 113 (*) 70 - 99 (mg/dL)    BUN 8  6 - 23 (mg/dL)    Creatinine, Ser 7.82  0.50 - 1.35 (mg/dL)    Calcium  8.8  8.4 - 10.5 (mg/dL)    GFR calc non Af Amer >90  >90 (mL/min)    GFR calc Af Amer >90  >90 (mL/min)   MAGNESIUM     Status: Normal   Collection Time   03/16/11  5:18 AM      Component Value Range Comment   Magnesium 2.1  1.5 - 2.5 (mg/dL)     Ct Head Wo Contrast  03/15/2011  *RADIOLOGY REPORT*  Clinical Data: Altered mental status; hallucinations.  CT HEAD WITHOUT CONTRAST  Technique:  Contiguous axial images were obtained from the base of the skull through the vertex without contrast.  Comparison: CT of the head performed 10/19/2010  Findings: There is no evidence of acute infarction, mass lesion, or intra- or extra-axial hemorrhage on CT.  The posterior fossa, including the cerebellum, brainstem and fourth ventricle, is within normal limits.  The third and lateral ventricles, and basal ganglia are unremarkable in appearance.  The cerebral hemispheres are symmetric in appearance, with normal gray- white differentiation.  No mass effect or midline  shift is seen.  There is no evidence of fracture; visualized osseous structures are unremarkable in appearance.  The visualized portions of the orbits are within normal limits.  The paranasal sinuses and mastoid air cells are well-aerated.  No significant soft tissue abnormalities are seen.  IMPRESSION: Unremarkable noncontrast CT of the head.  Original Report Authenticated By: Tonia Ghent, M.D.   Dg Chest Port 1 View  03/16/2011  *RADIOLOGY REPORT*  Clinical Data: Cough, shortness of breath, fever  PORTABLE CHEST - 1 VIEW  Comparison: None.  Findings: The cardiac silhouette, mediastinum, pulmonary vasculature are within normal limits.  Both lungs are clear. There is no acute bony abnormality.  IMPRESSION: There is no evidence of acute cardiac or pulmonary process.  Original Report Authenticated By: Brandon Melnick, M.D.    Review of Systems  Unable to perform ROS: mental status change   Blood pressure 109/78, pulse 112, temperature 98.8 F (37.1 C), temperature source Oral, resp. rate 16, height 5\' 6"  (1.676 m), weight 66.225 kg (146 lb), SpO2 97.00%. Physical Exam  Assessment/Plan: This patient has a history of drinking alcohol as much as a sixpack and/or vodka. He has had 2 arrests for DUI 14 and 20 years ago more recently he stopped drinking for months ago because he had a seizure. That is the only seizure he and stopped drinking immediately after having the seizure. He takes no medication "the most ever take his Aleve". He started drinking alcohol again because he said he was having a lot of stress at home and at work because he can't he was a slow. He has his own Production designer, theatre/television/film homes and apartment complexes. The work has been slow and that creates financial pressure because his wife has not worked in for the past 7 years. She claims to have "fibromyalgia" he does not know if this has been diagnosed (her mother has fibromyalgia) he says she has a lot of medical problems because  she's taking pills all the time. He has a 47-year-old child when of the stressors is the family closeness which his wife does not enjoy. The family is very close to who who lived in staying and migrated to the unit. They all live in a close proximity to one another and see or talk with each other daily. His sisterMagjen is very close to her brother and she brought him to the hospital.  He has very long hair  and beard. He has good eye contact and spontaneous speech. He denies any suicidal ideation. He does not remember being confused. He says. Lying in his family or talk to him and told him saying strange things. All he knows is that for the past 5 days he had stomach virus. He was nauseous and could not keep any food in his system. He also had severe diarrhea. When he was brought in he was told that he was dehydrated. He is feeling better and still gets IV fluids  he says that her caused his confused thinking. He is alert oriented to person place date and situation. He recognizes that he is drinking beer when he has difficulties. He indicates that he is willing to go to [employer] and has a therapist to talk about some of these stressful situations he faces day-to-day. He denies suicidal homicidal ideation. He claims that he's never been the fact before.he admits to stress causing depression. It is most likely that this sudden dehydration and is most compatible with the delusions more than alcohol withdrawal.  RECOMMENDATION  1 referred this patient to AA meetings post discharge  2 referred this patient to to a therapist on discharge  3 couples therapy is also recommended. 4 consider abstinence from alcohol and work toward sobriety.  Anayiah Howden 03/16/2011, 4:28 PM

## 2011-03-16 NOTE — Progress Notes (Addendum)
Spoke with Pt's sister.  Per sister, Pt has just recently developed a px with ETOH.  She suspects that Pt and his wife aren't doing well and that he has turned to ETOH as a means of coping.  She asked that CSW include her in any psych MD recommendations.  Per sister, Pt has no hx of mental health issues/concerns.  He has never received inpt or outpt tx.  Discussed case with psych MD.  Psych MD suggesting that CSW provide Pt with outpt therapy information, as well as AA and substance abuse tx info.  Met with Pt and sister.  Discussed psych MD's recommendations and went over AA, ETOH tx facilities and therapy info.  Offered to make outpt appointments for Pt.  Pt declined, at this time.  Pt's sister accepted the information.  Pt and sister thanked CSW for time and information.  Providence Crosby, LCSWA Clinical Social Work [phone]

## 2011-03-16 NOTE — Progress Notes (Signed)
ANTIBIOTIC CONSULT NOTE - INITIAL  Pharmacy Consult for Levaquin Indication: Empiric (low grade temp, possible choking on vomitus 2 days ago at home)  No Known Allergies  Patient Measurements: Height: 5\' 6"  (167.6 cm) Weight: 146 lb (66.225 kg) IBW/kg (Calculated) : 63.8   Vital Signs: Temp: 98.8 F (37.1 C) (01/17 1152) Temp src: Oral (01/17 0535) BP: 109/78 mmHg (01/17 0535) Pulse Rate: 112  (01/17 0535) Intake/Output from previous day: 01/16 0701 - 01/17 0700 In: 240 [P.O.:240] Out: -  Intake/Output from this shift:    Labs:  Basename 03/16/11 0518 03/15/11 0605 03/14/11 2322  WBC -- 5.3 5.9  HGB -- 13.3 13.9  PLT -- 147* 173  LABCREA -- -- --  CREATININE 0.68 0.63 0.63   Estimated Creatinine Clearance: 103 ml/min (by C-G formula based on Cr of 0.68).  No results found for this basename: VANCOTROUGH:2,VANCOPEAK:2,VANCORANDOM:2,GENTTROUGH:2,GENTPEAK:2,GENTRANDOM:2,TOBRATROUGH:2,TOBRAPEAK:2,TOBRARND:2,AMIKACINPEAK:2,AMIKACINTROU:2,AMIKACIN:2, in the last 72 hours   Microbiology: No results found for this or any previous visit (from the past 720 hour(s)).  Medical History: Past Medical History  Diagnosis Date  . Alcoholism   . Hyperlipemia     Medications:  Scheduled:    . chlordiazePOXIDE      . chlordiazePOXIDE  25 mg Oral TID  . enoxaparin  40 mg Subcutaneous Q24H  . folic acid  1 mg Oral Daily  . influenza  inactive virus vaccine  0.5 mL Intramuscular Tomorrow-1000  . LORazepam  0-4 mg Oral Q6H   Followed by  . LORazepam  0-4 mg Oral Q12H  . magnesium sulfate 1 - 4 g bolus IVPB  1 g Intravenous Once  . mulitivitamin with minerals  1 tablet Oral Daily  . nicotine  14 mg Transdermal Daily  . pneumococcal 23 valent vaccine  0.5 mL Intramuscular Tomorrow-1000  . potassium chloride  40 mEq Oral Once  . thiamine  100 mg Oral Daily   Or  . thiamine  100 mg Intravenous Daily   Infusions:   PRN: acetaminophen, albuterol, guaiFENesin, LORazepam,  LORazepam, DISCONTD: guaiFENesin  Assessment: 46 yo M to start empiric Levaquin. BCx x2 have been sent. Pt reports he may have choked on vomit at home 2 days ago. (r/o asp PNA?)  Plan:  Levaquin 750mg  IV q24h  Annia Belt 03/16/2011,11:58 AM

## 2011-03-16 NOTE — Progress Notes (Signed)
Pt wife called and would like to speak with the Dr.  Her number is [phone].

## 2011-03-16 NOTE — Progress Notes (Signed)
Pt family request tylenlol for temp of 100.o Md on called sent text page.New orders noted.

## 2011-03-16 NOTE — Progress Notes (Signed)
Dr. Thedore Mins informed of pt wife  Request to have him call her with an update.

## 2011-03-16 NOTE — Progress Notes (Addendum)
[name] RUE:[id number]:[id number] is a 46 y.o. male,  Outpatient Primary MD for the patient is Ian Labella, MD, MD  Chief Complaint  Patient presents with  . Delusional    x few days??  per the family. pt lives w/his wife who is at home w/small child who claims the same.  . Altered Mental Status    pt is alert to self but needs slight reorienting to place and time.          Subjective:   [name] today has, No headache, No chest pain, No abdominal pain - No Nausea, No new weakness tingling or numbness, No Cough - SOB.    Objective:   Filed Vitals:   03/15/11 1420 03/15/11 2057 03/16/11 0535 03/16/11 0935  BP: 119/79 113/73 109/78   Pulse: 98 110 112   Temp: 100.6 F (38.1 C) 99.6 F (37.6 C) 99.9 F (37.7 C) 100 F (37.8 C)  TempSrc: Oral Oral Oral   Resp: 18 18 16    Height:      Weight:      SpO2: 98% 99% 97%     Wt Readings from Last 3 Encounters:  03/14/11 66.225 kg (146 lb)     Intake/Output Summary (Last 24 hours) at 03/16/11 1114 Last data filed at 03/16/11 0300  Gross per 24 hour  Intake    240 ml  Output      0 ml  Net    240 ml    Exam Awake Alert, Oriented *3, No new F.N deficits, Normal affect [place].AT,PERRAL Supple Neck,No JVD, No cervical lymphadenopathy appriciated.  Symmetrical Chest wall movement, Good air movement bilaterally, few rales RRR,No Gallops,Rubs or new Murmurs, No Parasternal Heave +ve B.Sounds, Abd Soft, Non tender, No organomegaly appriciated, No rebound -guarding or rigidity. No Cyanosis, Clubbing or edema, No new Rash or bruise     Data Review  CBC  Lab 03/15/11 0605 03/14/11 2322  WBC 5.3 5.9  HGB 13.3 13.9  HCT 38.4* 39.7  PLT 147* 173  MCV 88.7 88.0  MCH 30.7 30.8  MCHC 34.6 35.0  RDW 13.6 13.6  LYMPHSABS -- --  MONOABS -- --  EOSABS -- --  BASOSABS -- --  BANDABS -- --    Chemistries   Lab 03/16/11 0518 03/15/11 0605 03/14/11 2322  NA 134* 136 133*  K 3.2* 3.2* 2.4*  CL 97 102  94*  CO2 25 26 26   GLUCOSE 113* 96 97  BUN 8 8 9   CREATININE 0.68 0.63 0.63  CALCIUM 8.8 8.9 9.6  MG 2.1 1.7 --  AST -- -- 69*  ALT -- -- 97*  ALKPHOS -- -- 67  BILITOT -- -- 0.6   ------------------------------------------------------------------------------------------------------------------ estimated creatinine clearance is 103 ml/min (by C-G formula based on Cr of 0.68). ------------------------------------------------------------------------------------------------------------------ No results found for this basename: HGBA1C:2 in the last 72 hours ------------------------------------------------------------------------------------------------------------------ No results found for this basename: CHOL:2,HDL:2,LDLCALC:2,TRIG:2,CHOLHDL:2,LDLDIRECT:2 in the last 72 hours ------------------------------------------------------------------------------------------------------------------ No results found for this basename: TSH,T4TOTAL,FREET3,T3FREE,THYROIDAB in the last 72 hours ------------------------------------------------------------------------------------------------------------------ No results found for this basename: VITAMINB12:2,FOLATE:2,FERRITIN:2,TIBC:2,IRON:2,RETICCTPCT:2 in the last 72 hours  Coagulation profile No results found for this basename: INR:5,PROTIME:5 in the last 168 hours  No results found for this basename: DDIMER:2 in the last 72 hours  Cardiac Enzymes No results found for this basename: CK:3,CKMB:3,TROPONINI:3,MYOGLOBIN:3 in the last 168 hours ------------------------------------------------------------------------------------------------------------------ No components found with this basename: POCBNP:3  Micro Results No results found for this or any previous visit (from the past 240 hour(s)).  Radiology  Reports Ct Head Wo Contrast  03/15/2011  *RADIOLOGY REPORT*  Clinical Data: Altered mental status; hallucinations.  CT HEAD WITHOUT CONTRAST   Technique:  Contiguous axial images were obtained from the base of the skull through the vertex without contrast.  Comparison: CT of the head performed 10/19/2010  Findings: There is no evidence of acute infarction, mass lesion, or intra- or extra-axial hemorrhage on CT.  The posterior fossa, including the cerebellum, brainstem and fourth ventricle, is within normal limits.  The third and lateral ventricles, and basal ganglia are unremarkable in appearance.  The cerebral hemispheres are symmetric in appearance, with normal gray- white differentiation.  No mass effect or midline shift is seen.  There is no evidence of fracture; visualized osseous structures are unremarkable in appearance.  The visualized portions of the orbits are within normal limits.  The paranasal sinuses and mastoid air cells are well-aerated.  No significant soft tissue abnormalities are seen.  IMPRESSION: Unremarkable noncontrast CT of the head.  Original Report Authenticated By: Tonia Ghent, M.D.    Scheduled Meds:    . chlordiazePOXIDE      . chlordiazePOXIDE  25 mg Oral TID  . enoxaparin  40 mg Subcutaneous Q24H  . folic acid  1 mg Oral Daily  . influenza  inactive virus vaccine  0.5 mL Intramuscular Tomorrow-1000  . LORazepam  0-4 mg Oral Q6H   Followed by  . LORazepam  0-4 mg Oral Q12H  . magnesium sulfate 1 - 4 g bolus IVPB  1 g Intravenous Once  . mulitivitamin with minerals  1 tablet Oral Daily  . nicotine  14 mg Transdermal Daily  . pneumococcal 23 valent vaccine  0.5 mL Intramuscular Tomorrow-1000  . potassium chloride  40 mEq Oral Once  . thiamine  100 mg Oral Daily   Or  . thiamine  100 mg Intravenous Daily   Continuous Infusions:  PRN Meds:.acetaminophen, guaiFENesin, LORazepam, LORazepam, DISCONTD: guaiFENesin  Assessment & Plan   1. Alcohol withdrawal - stable on liibrium + CIWA protocol, CT head negative, symptom free continue Vitamins, counseled. Off day 4. He denies any delusions or  hallucinations, wife requested to talk to S work for family issues between her and in laws. Wife admant pt does get delusional at times, will have Psych see pt too.   2. Low K replaced & stable   3. Tobacco abuse - counseled.   4. Low grade temp with cough - says he might have chocked on Vomitus 2 days ago at home, few rales, will check B Cult, CXR, ABX now still treat as CAP. Nebs-o2 PRN.    DVT Prophylaxis  Lovenox    See all Orders from today for further details    Leroy Sea M.D on 03/16/2011 at 11:14 AM  [facility] Office  [phone]

## 2011-03-17 LAB — BASIC METABOLIC PANEL
BUN: 11 mg/dL (ref 6–23)
CO2: 29 mEq/L (ref 19–32)
Chloride: 99 mEq/L (ref 96–112)
Creatinine, Ser: 0.76 mg/dL (ref 0.50–1.35)
Glucose, Bld: 97 mg/dL (ref 70–99)

## 2011-03-17 MED ORDER — LEVOFLOXACIN 750 MG PO TABS: 750.0000 mg | ORAL_TABLET | Freq: Every day | ORAL | Status: AC

## 2011-03-17 MED ORDER — FOLIC ACID 1 MG PO TABS: 1.0000 mg | ORAL_TABLET | Freq: Every day | ORAL | Status: AC

## 2011-03-17 MED ORDER — ATIVAN 0.5 MG PO TABS: 0.5000 mg | ORAL_TABLET | Freq: Three times a day (TID) | ORAL | Status: AC

## 2011-03-17 MED ORDER — VITAMIN B-1 100 MG PO TABS: 100.0000 mg | ORAL_TABLET | Freq: Every day | ORAL | Status: AC

## 2011-03-17 NOTE — Progress Notes (Signed)
NURSING   MD notified that pt BP is 99/62, pulse rate 94, no s/s of distress, will continue to monitor the patient.

## 2011-03-17 NOTE — Discharge Summary (Signed)
[name], 46 y.o., DOB 1966/01/08, MRN [id number]. Admission date: 03/14/2011 Discharge Date 03/17/2011 Primary MD [name], MD, MD Admitting Physician Leroy Sea, MD  Admission Diagnosis  Encephalopathy acute [348.30] H/O alcohol abuse [305.03] not coherent delusional  Discharge Diagnosis   Principal Problem:  *Alcohol withdrawal Active Problems:  Tobacco abuse    Past Medical History  Diagnosis Date  . Alcoholism   . Hyperlipemia     Past Surgical History  Procedure Date  . No past surgeries      Hospital Course See H&P, Labs, Consult and Test reports for all details in brief, patient was admitted for Gastroenteritis causing Nausea and Vomiting (putting him in alcohol withdrawal)  with possible mild Aspiration Pneumonitis, was treated per CIWA protocol and now completely DT free, back to baseline, in no distress, will need repeat CBC, BMP,CXR in 3 days, he will go to his PCP office for it, was seen by Psych and S Work, Mother in room and updated per Pt request. Pt counseled again to abstain from smoking and Alcohol, he wanted outpt follow with AA and counseling if needed. Now has no fevers, no cough or SOB, no o2 need, wants to go home.   Consults  Psych, S Work  Significant Tests:  See full reports for all details     Ct Head Wo Contrast  03/15/2011  *RADIOLOGY REPORT*  Clinical Data: Altered mental status; hallucinations.  CT HEAD WITHOUT CONTRAST  Technique:  Contiguous axial images were obtained from the base of the skull through the vertex without contrast.  Comparison: CT of the head performed 10/19/2010  Findings: There is no evidence of acute infarction, mass lesion, or intra- or extra-axial hemorrhage on CT.  The posterior fossa, including the cerebellum, brainstem and fourth ventricle, is within normal limits.  The third and lateral ventricles, and basal ganglia are unremarkable in appearance.  The cerebral hemispheres are symmetric in appearance,  with normal gray- white differentiation.  No mass effect or midline shift is seen.  There is no evidence of fracture; visualized osseous structures are unremarkable in appearance.  The visualized portions of the orbits are within normal limits.  The paranasal sinuses and mastoid air cells are well-aerated.  No significant soft tissue abnormalities are seen.  IMPRESSION: Unremarkable noncontrast CT of the head.  Original Report Authenticated By: Tonia Ghent, M.D.   Dg Chest Port 1 View  03/16/2011  *RADIOLOGY REPORT*  Clinical Data: Cough, shortness of breath, fever  PORTABLE CHEST - 1 VIEW  Comparison: None.  Findings: The cardiac silhouette, mediastinum, pulmonary vasculature are within normal limits.  Both lungs are clear. There is no acute bony abnormality.  IMPRESSION: There is no evidence of acute cardiac or pulmonary process.  Original Report Authenticated By: Brandon Melnick, M.D.     Today   Subjective:   [name] today has no headache,no chest abdominal pain,no new weakness tingling or numbness, feels much better wants to go home today.    Objective:   Blood pressure 112/69, pulse 88, temperature 98.2 F (36.8 C), temperature source Oral, resp. rate 20, height 5\' 6"  (1.676 m), weight 66.225 kg (146 lb), SpO2 98.00%.  Intake/Output Summary (Last 24 hours) at 03/17/11 1554 Last data filed at 03/17/11 1300  Gross per 24 hour  Intake    480 ml  Output      2 ml  Net    478 ml    Exam Awake Alert, Oriented *3, No new F.N deficits, Normal affect  [place].AT,PERRAL Supple Neck,No JVD, No cervical lymphadenopathy appriciated.  Symmetrical Chest wall movement, Good air movement bilaterally, CTAB RRR,No Gallops,Rubs or new Murmurs, No Parasternal Heave +ve B.Sounds, Abd Soft, Non tender, No organomegaly appriciated, No rebound -guarding or rigidity. No Cyanosis, Clubbing or edema, No new Rash or bruise  Data Review      CBC w Diff: Lab Results  Component Value Date   WBC  5.3 03/15/2011   HGB 13.3 03/15/2011   HCT 38.4* 03/15/2011   PLT 147* 03/15/2011   LYMPHOPCT 16 10/19/2010   MONOPCT 17* 10/19/2010   EOSPCT 1 10/19/2010   BASOPCT 0 10/19/2010   CMP: Lab Results  Component Value Date   NA 136 03/17/2011   K 3.7 03/17/2011   CL 99 03/17/2011   CO2 29 03/17/2011   BUN 11 03/17/2011   CREATININE 0.76 03/17/2011   PROT 7.7 03/14/2011   ALBUMIN 4.1 03/14/2011   BILITOT 0.6 03/14/2011   ALKPHOS 67 03/14/2011   AST 69* 03/14/2011   ALT 97* 03/14/2011  .  Micro Results Recent Results (from the past 240 hour(s))  CULTURE, BLOOD (ROUTINE X 2)     Status: Normal (Preliminary result)   Collection Time   03/16/11 12:25 PM      Component Value Range Status Comment   Specimen Description BLOOD LEFT ARM   Final    Special Requests BOTTLES DRAWN AEROBIC AND ANAEROBIC 10CC   Final    Setup Time [id number]   Final    Culture     Final    Value:        BLOOD CULTURE RECEIVED NO GROWTH TO DATE CULTURE WILL BE HELD FOR 5 DAYS BEFORE ISSUING A FINAL NEGATIVE REPORT   Report Status PENDING   Incomplete   CULTURE, BLOOD (ROUTINE X 2)     Status: Normal (Preliminary result)   Collection Time   03/16/11 12:30 PM      Component Value Range Status Comment   Specimen Description BLOOD LEFT ARM   Final    Special Requests BOTTLES DRAWN AEROBIC AND ANAEROBIC 10CC   Final    Setup Time [id number]   Final    Culture     Final    Value:        BLOOD CULTURE RECEIVED NO GROWTH TO DATE CULTURE WILL BE HELD FOR 5 DAYS BEFORE ISSUING A FINAL NEGATIVE REPORT   Report Status PENDING   Incomplete      Discharge Instructions     Follow with Primary MD [name], MD, MD in 3 days   Get CBC, CMP, follow your Blood Cultures report checked  in 3 days by Primary MD and again as instructed by your Primary MD. Get a 2 view Chest X ray done next visit.  Get Medicines reviewed and adjusted.  Please request your Prim.MD to go over all Hospital Tests and Procedure/Radiological results at  the follow up, please get all Hospital records sent to your Prim MD by signing hospital release before you go home.  Activity: Fall precautions use walker/cane & assistance as needed  Diet: Heart Healthy, Aspiration precautions.  For Heart failure patients - Check your Weight same time everyday, if you gain over 2 pounds, or you develop in leg swelling, experience more shortness of breath or chest pain, call your Primary MD immediately. Follow Cardiac Low Salt Diet and 1.8 lit/day fluid restriction.  Disposition Home  If you experience worsening of your admission symptoms, develop shortness of breath, life threatening  emergency, suicidal or homicidal thoughts you must seek medical attention immediately by calling 911 or calling your MD immediately  if symptoms less severe.  You Must read complete instructions/literature along with all the possible adverse reactions/side effects for all the Medicines you take and that have been prescribed to you. Take any new Medicines after you have completely understood and accpet all the possible adverse reactions/side effects.   Do not drive if your were admitted for syncope or siezures until you have seen by Primary MD or a Neurologist and advised to drive.  Do not drive when taking Pain medications.    Do not take more than prescribed Pain, Sleep and Anxiety Medications  Special Instructions: If you have smoked or chewed Tobacco  in the last 2 yrs please stop smoking, stop any regular Alcohol  and or any Recreational drug use.  Wear Seat belts while driving.  Follow-up Information    Follow up with [facility]  on 04/04/2011. (Eligibility appointment AT 215PM)    Contact information:   [address] [place], [place] [phone]      Follow up with DR SHAW/[facility] on 05/05/2011. (Eligibility appt AT 215PM)    Contact information:   [address] [place], [place] [phone]      Follow up with [name], MD. Schedule an  appointment as soon as possible for a visit on 03/20/2011.   Contact information:   [address] [place] [address] [phone]          Discharge Medications   Medication List  As of 03/17/2011  3:54 PM   START taking these medications         ATIVAN 0.5 MG tablet   Generic drug: LORazepam   Take 1 tablet (0.5 mg total) by mouth every 8 (eight) hours. Please dispense 18 pills - Take 1 pill three times a day for 3 days, then Take 1 pill two times a day for 3 days, then Take 1 pill once a day for 3 days and stop.      folic acid 1 MG tablet   Commonly known as: FOLVITE   Take 1 tablet (1 mg total) by mouth daily.      levofloxacin 750 MG tablet   Commonly known as: LEVAQUIN   Take 1 tablet (750 mg total) by mouth daily.      thiamine 100 MG tablet   Commonly known as: VITAMIN B-1   Take 1 tablet (100 mg total) by mouth daily.          Where to get your medications    These are the prescriptions that you need to pick up.   You may get these medications from any pharmacy.         ATIVAN 0.5 MG tablet   folic acid 1 MG tablet   levofloxacin 750 MG tablet   thiamine 100 MG tablet             Total Time in preparing paper work, data evaluation and todays exam - 35 minutes  Leroy Sea M.D on 03/17/2011 at 3:54 PM  [facility] Office  [phone]

## 2011-03-17 NOTE — Progress Notes (Signed)
Pt d/c'd home with mother. Pt given d/c instructions and info regarding follow-up and prescriptions ordered by MD. Pt verbalized understanding of d/c instructions. No changes since am assessment.   Horatio Pel 03/17/2011

## 2011-11-21 ENCOUNTER — Ambulatory Visit (INDEPENDENT_AMBULATORY_CARE_PROVIDER_SITE_OTHER): Payer: PRIVATE HEALTH INSURANCE | Admitting: Family Medicine

## 2011-11-21 VITALS — BP 110/70 | HR 89 | Temp 98.8°F | Resp 14 | Ht 66.0 in | Wt 142.0 lb

## 2011-11-21 DIAGNOSIS — F419 Anxiety disorder, unspecified: Secondary | ICD-10-CM

## 2011-11-21 DIAGNOSIS — G4700 Insomnia, unspecified: Secondary | ICD-10-CM

## 2011-11-21 DIAGNOSIS — F411 Generalized anxiety disorder: Secondary | ICD-10-CM

## 2011-11-21 MED ORDER — ZOLPIDEM TARTRATE 10 MG PO TABS: ORAL_TABLET | ORAL | Status: DC

## 2011-11-21 NOTE — Progress Notes (Signed)
  Subjective:    Patient ID: [name], male    DOB: 1965-11-30, 46 y.o.   MRN: [id number]  HPI  Can't fall asleep as having racing thoughts, maybe only sleeping 2.5-3hrs/night. 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  Very stressed out due to separation.  Having chills/sweats at night - but no true night sweats. No appetite. Has lost about 10 lbs involuntarily.  Can't concentrate due to fatigue.  Sometimes does feel sad, still doing activities but not quite as much.  Is not having any panic attacks. No prior h/o depression or anxiety. Doesn't have any other medical problems and is very concentrate that everything is due to his fatigue. He refuses blood work today due to a needle phobia but will return if he continues to have symptoms.   Review of Systems  Constitutional: Positive for chills, activity change, appetite change, fatigue and unexpected weight change. Negative for fever and diaphoresis.  HENT: Negative for trouble swallowing, neck pain and neck stiffness.   Respiratory: Negative for shortness of breath.   Cardiovascular: Negative for palpitations.  Gastrointestinal: Negative for diarrhea and constipation.  Skin: Negative.   Neurological: Negative for tremors and weakness.  Psychiatric/Behavioral: Positive for disturbed wake/sleep cycle, dysphoric mood and decreased concentration. The patient is nervous/anxious.        Objective:   Physical Exam  Vitals reviewed. Constitutional: He is oriented to person, place, and time. He appears well-developed and well-nourished. No distress.  HENT:  Head: Normocephalic and atraumatic.  Eyes: Conjunctivae normal are normal. No scleral icterus.  Neck: Normal range of motion. Neck supple. No thyromegaly present.  Cardiovascular: Normal rate, regular rhythm, normal heart sounds and intact distal pulses.   Pulmonary/Chest: Effort normal and breath sounds normal. No respiratory  distress.  Abdominal: Soft. Bowel sounds are normal. He exhibits no distension. There is no tenderness.  Musculoskeletal: He exhibits no edema.  Lymphadenopathy:    He has no cervical adenopathy.  Neurological: He is alert and oriented to person, place, and time.  Skin: Skin is warm and dry. He is not diaphoretic. No erythema.  Psychiatric: He has a normal mood and affect. His behavior is normal.          Assessment & Plan:  1.  Insomnia - due to anxiety with current separation.  Will treat in short term w/ Ambien - warned of hangover effects w/ sedation and memory effects.  If sxs do not improved, RTC for further eval - gave warning signs for hyperthyroid. If sxs do improve, RTC in sev mos to determine if pt should be transitioned to alternative treatment (trazodone, ssri) vs just wean off as not a long term med. Pt agreeable to plan.

## 2011-11-21 NOTE — Progress Notes (Signed)
Reviewed and agree.

## 2011-11-21 NOTE — Patient Instructions (Signed)
Make sure you come back if you are noticing any of the below symptoms or if your anxiety or sleep does not improve.  Lets see you back in several months to determine the next steps for your treatment.  Hyperthyroidism The thyroid is a large gland located in the lower front part of your neck. The thyroid helps control metabolism. Metabolism is how your body uses food. It controls metabolism with the hormone thyroxine. When the thyroid is overactive, it produces too much hormone. When this happens, these following problems may occur:   Nervousness   Heat intolerance   Weight loss (in spite of increase food intake)   Diarrhea   Change in hair or skin texture   Palpitations (heart skipping or having extra beats)   Tachycardia (rapid heart rate)   Loss of menstruation (amenorrhea)   Shaking of the hands  CAUSES  Grave's Disease (the immune system attacks the thyroid gland). This is the most common cause.   Inflammation of the thyroid gland.   Tumor (usually benign) in the thyroid gland or elsewhere.   Excessive use of thyroid medications (both prescription and 'natural').   Excessive ingestion of Iodine.  DIAGNOSIS  To prove hyperthyroidism, your caregiver may do blood tests and ultrasound tests. Sometimes the signs are hidden. It may be necessary for your caregiver to watch this illness with blood tests, either before or after diagnosis and treatment. TREATMENT Short-term treatment There are several treatments to control symptoms. Drugs called beta blockers may give some relief. Drugs that decrease hormone production will provide temporary relief in many people. These measures will usually not give permanent relief. Definitive therapy There are treatments available which can be discussed between you and your caregiver which will permanently treat the problem. These treatments range from surgery (removal of the thyroid), to the use of radioactive iodine (destroys the thyroid by  radiation), to the use of antithyroid drugs (interfere with hormone synthesis). The first two treatments are permanent and usually successful. They most often require hormone replacement therapy for life. This is because it is impossible to remove or destroy the exact amount of thyroid required to make a person euthyroid (normal). HOME CARE INSTRUCTIONS  See your caregiver if the problems you are being treated for get worse. Examples of this would be the problems listed above. SEEK MEDICAL CARE IF: Your general condition worsens. MAKE SURE YOU:   Understand these instructions.   Will watch your condition.   Will get help right away if you are not doing well or get worse.  Document Released: 02/13/2005 Document Revised: 02/02/2011 Document Reviewed: 06/27/2006 [facility] Patient Information 2012 [place], [place].

## 2011-11-24 ENCOUNTER — Telehealth: Payer: Self-pay | Admitting: Radiology

## 2011-11-24 NOTE — Telephone Encounter (Signed)
Patient called he uses [facility] [address], states Ambien not working wants call back. Phone number  [phone]

## 2011-11-24 NOTE — Telephone Encounter (Signed)
I think a recheck would be best.  If pt is not happy about this please send to Dr. Clelia Croft.

## 2011-11-25 NOTE — Telephone Encounter (Signed)
Pt states that he started with 1/4 tab, then 1/2 tab, then finally 1 tab and he stated that it worked and [place] and he only has 15 tablets.  I advised him that this was only short term and that he should RTC if his symptoms returned to discuss a different alternative.  He stated why if its short term would Dr. Clelia Croft give him 5 refills.  So he basically wants a 30 day supply. Please advise

## 2011-11-25 NOTE — Telephone Encounter (Signed)
lmom to cb. 

## 2011-11-26 ENCOUNTER — Other Ambulatory Visit: Payer: Self-pay | Admitting: Family Medicine

## 2011-11-26 DIAGNOSIS — G4700 Insomnia, unspecified: Secondary | ICD-10-CM

## 2011-11-26 MED ORDER — TRAZODONE HCL 50 MG PO TABS: 25.0000 mg | ORAL_TABLET | Freq: Every evening | ORAL | Status: DC | PRN

## 2011-11-26 NOTE — Telephone Encounter (Signed)
It is not to be used every single night as he will become dependent upon it and it may stop working. It is to be used as needed - hopefully less than 1/2 the night's/mo - that's why he is only getting 15 tabs/mo.  If he needs something for every night, I would recommend trying something without as many potential side effects of tolerance, dependence, and addiction like Remus Loffler has.  I sent a prescription for trazodone to his pharmacy.  Take at night as needed for sleep. He doesn't HAVE to take it if not needed but it is safe to use regularly if it helps.

## 2011-11-27 NOTE — Telephone Encounter (Signed)
I

## 2011-11-27 NOTE — Telephone Encounter (Signed)
Noted, will wait to see pt in office to discuss further. If he wants to take Ambien every night for the short term - 1-2 mos only, I am fine w/ that. It is just not a permanent med.

## 2011-11-27 NOTE — Telephone Encounter (Signed)
Patient did not like my answer, he wants Ambien for use every night, he does not want the Trazodone, he is going to come in to see you. FYI

## 2011-12-07 ENCOUNTER — Telehealth: Payer: Self-pay

## 2011-12-07 NOTE — Telephone Encounter (Signed)
I called patient back and he is advised to come in for exam and possible labs.

## 2011-12-07 NOTE — Telephone Encounter (Signed)
PT CALLED WANTING TO SPEAK TO A CLINICAL STAFF MEMBER REGARDING "SEXUAL ISSUES" PLEASE CALL PT AT [phone]

## 2011-12-18 ENCOUNTER — Telehealth: Payer: Self-pay

## 2011-12-18 MED ORDER — ZOLPIDEM TARTRATE 10 MG PO TABS: 10.0000 mg | ORAL_TABLET | Freq: Every evening | ORAL | Status: DC | PRN

## 2011-12-18 NOTE — Telephone Encounter (Signed)
Looks like pt has a cpe sched with me in 1 mo. Fine to call in ambien 10mg  1 tab po qhs prn sleep. Disp 30, no refills. We will discuss further at his next OV.

## 2011-12-18 NOTE — Telephone Encounter (Signed)
Called in Rx for Cle[place] and notified pt that it has been done. Pt asked if we could give him samples for Viagra. I explained that we do not have samples and that I did not see a discussion about need for ED med in OV notes. Pt stated that he had talked w/Dr Clelia Croft about the problem when he was in and requests a Rx for viagra to be sent to pharmacy. I explained that Dr Clelia Croft will have to review this request and that she will not be back in the office until Wed afternoon. Dr. Clelia Croft, do you want to Rx viagra for pt?

## 2011-12-18 NOTE — Telephone Encounter (Signed)
Pt called and stated that the Trazodone did not help him sleep as well as the Ambien. He reports that the Ambien works pretty well if he takes one whole tablet Qhs, but does not work at 1/2 tab. Pt requests that we send new Rx into pharmacy so that he can get #30 for 30 days instead of just #15 since he needs the whole tablet. Pt is going out of the country tomorrow and would like this done tonight so that he will have it to take w/him. I let pt know that we would try to get it done for him if possible, but that if need be he can p/up his RF from the pharmacy for the #15 on orig Rx.

## 2011-12-19 NOTE — Telephone Encounter (Signed)
I don't recall discussing this w/ pt but ok to call in viagra 50mg  1 po 30 min prior to sexual activity disp 5, no refills.  He may try breaking them in half to make them last longer as this is a medium dose.  If he has any chest pain, call 911 and make sure he informs all medical providers of the use.  Before we continue this medicine, we need to do fasting lab work - first thing in the a.m. - to ensure he is not at increased risk for heart disease as well as his hormone levels.

## 2011-12-20 ENCOUNTER — Telehealth: Payer: Self-pay | Admitting: Family Medicine

## 2011-12-20 MED ORDER — SILDENAFIL CITRATE 50 MG PO TABS: 50.0000 mg | ORAL_TABLET | ORAL | Status: DC | PRN

## 2011-12-20 NOTE — Telephone Encounter (Signed)
Left message to call back  

## 2011-12-20 NOTE — Telephone Encounter (Signed)
Patient advised, and he made appt with Dr Clelia Croft.

## 2012-01-05 ENCOUNTER — Encounter: Payer: PRIVATE HEALTH INSURANCE | Admitting: Family Medicine

## 2012-01-09 ENCOUNTER — Telehealth: Payer: Self-pay

## 2012-01-09 NOTE — Telephone Encounter (Signed)
Called patient, he needs labs to continue Viagra, I advised him when he was sent in the Rx he would need this. I left message for him to call back, so I can clarify which medication he is requesting.

## 2012-01-09 NOTE — Telephone Encounter (Signed)
PT STATES HE WASN'T ABLE TO MAKE HIS APPT WITH DR SHAW, BUT NEED REFILLS ON HIS MEDS, WANTED TO SPEAK [facility] HER FIRST PLEASE CALL [phone]

## 2012-01-10 NOTE — Telephone Encounter (Signed)
Patient requests Ambien renewal, asking for #30 tablets for month supply. Also states he would like Rx for Paroxotine, he states his brother takes this and he would like to take it also, for anxiety. Please advise. Patient cancelled appt with Dr Clelia Croft and has not rescheduled.

## 2012-01-10 NOTE — Telephone Encounter (Signed)
1) Patient is not on Paxil. We cannot start him on this medication without first seeing him to evaluate the need for this. He must come in for that. Sorry.  2) We must have a follow up plan in place to be able to continue to prescribe a controlled substance. Since he has canceled his appointment and has not rescheduled we will be unable to refill this medication until he has planned follow up or followed up with [place].

## 2012-01-10 NOTE — Telephone Encounter (Signed)
I have called patient to advise. He is transferred to appt scheduling.

## 2012-01-12 ENCOUNTER — Ambulatory Visit (INDEPENDENT_AMBULATORY_CARE_PROVIDER_SITE_OTHER): Payer: PRIVATE HEALTH INSURANCE | Admitting: Family Medicine

## 2012-01-12 ENCOUNTER — Encounter: Payer: Self-pay | Admitting: Family Medicine

## 2012-01-12 VITALS — BP 120/68 | HR 97 | Temp 98.0°F | Resp 16 | Ht 65.5 in | Wt 142.0 lb

## 2012-01-12 DIAGNOSIS — G4700 Insomnia, unspecified: Secondary | ICD-10-CM

## 2012-01-12 DIAGNOSIS — F329 Major depressive disorder, single episode, unspecified: Secondary | ICD-10-CM

## 2012-01-12 MED ORDER — CITALOPRAM HYDROBROMIDE 20 MG PO TABS: ORAL_TABLET | ORAL | Status: DC

## 2012-01-12 MED ORDER — CITALOPRAM HYDROBROMIDE 40 MG PO TABS: 40.0000 mg | ORAL_TABLET | Freq: Every day | ORAL | Status: DC

## 2012-01-12 MED ORDER — ZOLPIDEM TARTRATE ER 12.5 MG PO TBCR: 12.5000 mg | EXTENDED_RELEASE_TABLET | Freq: Every evening | ORAL | Status: DC | PRN

## 2012-01-12 NOTE — Progress Notes (Signed)
Subjective:    Patient ID: [name], male    DOB: [birth year], 46 y.o.   MRN: [id number]  HPI  He is still feeling depressed.  He has been visiting family which has been a little stressful.  He is mainly upset about his recent separation and not being able to see his 28 yo son except for on wkends. He is sleeping a little better - taking 10mg  ambien qhs and will sleep 5 hrs instead of 2-3. Has to take everynight, usu takes when he is done with his paperwork from the day - about 9 or 10, then watches tv until he falls asleep - generally about 1-2 hrs later. Feels like he is loosing weight and has decreased sex drive.  Enjoys his work and able to concentrate it but really doesn't do anything else for fun but does look forward to seeing his son on wkends.  Past Medical History  Diagnosis Date  . Alcoholism   . Hyperlipemia    Current Outpatient Prescriptions on File Prior to Visit  Medication Sig Dispense Refill  . sildenafil (VIAGRA) 50 MG tablet Take 1 tablet (50 mg total) by mouth as needed for erectile dysfunction.  5 tablet  0  . traZODone (DESYREL) 50 MG tablet Take 0.5-1 tablets (25-50 mg total) by mouth at bedtime as needed for sleep.  30 tablet  3  .      . folic acid (FOLVITE) 1 MG tablet Take 1 tablet (1 mg total) by mouth daily.  30 tablet  0  . thiamine (VITAMIN B-1) 100 MG tablet Take 1 tablet (100 mg total) by mouth daily.  30 tablet  0   No Known Allergies   Review of Systems  Constitutional: Positive for appetite change, fatigue and unexpected weight change. Negative for diaphoresis and activity change.  Cardiovascular: Negative for chest pain and palpitations.  Gastrointestinal: Negative for nausea and vomiting.  Neurological: Negative for dizziness, tremors, syncope and light-headedness.  Hematological: Negative for adenopathy. Does not bruise/bleed easily.  Psychiatric/Behavioral: Positive for sleep disturbance and dysphoric mood. Negative for suicidal ideas,  hallucinations, behavioral problems, confusion, self-injury, decreased concentration and agitation. The patient is nervous/anxious. The patient is not hyperactive.       BP 120/68  Pulse 97  Temp 98 F (36.7 C) (Oral)  Resp 16  Ht 5' 5.5" (1.664 m)  Wt 142 lb (64.411 kg)  BMI 23.27 kg/m2  SpO2 99% Objective:   Physical Exam  Constitutional: He is oriented to person, place, and time. He appears well-developed and well-nourished. No distress.  HENT:  Head: Normocephalic and atraumatic.  Eyes: Conjunctivae normal are normal. No scleral icterus.  Neck: Normal range of motion. Neck supple. No thyromegaly present.  Cardiovascular: Normal rate, regular rhythm, normal heart sounds and intact distal pulses.   Pulmonary/Chest: Effort normal and breath sounds normal. No respiratory distress.  Musculoskeletal: He exhibits no edema.  Lymphadenopathy:    He has no cervical adenopathy.  Neurological: He is alert and oriented to person, place, and time.  Skin: Skin is warm and dry. He is not diaphoretic.  Psychiatric: He has a normal mood and affect. His behavior is normal.         Becks Depression Inventory = 10 - he sored 1 on not enjoying things, feel guilty, critical of self, more irritated than usu, worried i am looking old, extra effort to get started, not sleeping as well, tired more easily, decreased appetite, lost >5lbs, less interested in sex. Assessment &  Plan:   1. Insomnia - switch from ambien 10 to CR 12.5 as long as cost is reasonable. OK for pt to take every night for now but not a permanent med - pt agrees.    2. Depression - start citalopram and wean up to 40 - pt discouraged about slow onset but willing to try.  May want to consider augmenting w/ wellbutrin in future as could help energy, sex drive, smoking cessation but may exacerbate insomnia.  Pt has needle phobia but consider labs at f/u - cbc, tsh

## 2012-01-12 NOTE — Patient Instructions (Addendum)

## 2012-01-19 ENCOUNTER — Encounter: Payer: PRIVATE HEALTH INSURANCE | Admitting: Family Medicine

## 2012-02-09 ENCOUNTER — Telehealth: Payer: Self-pay

## 2012-02-09 DIAGNOSIS — G4700 Insomnia, unspecified: Secondary | ICD-10-CM

## 2012-02-09 NOTE — Telephone Encounter (Signed)
The patient called again regarding his Ambien rx.  The patient stated he needs the rx to be lowered from the 12.5mg  to the 10mg .  The patient is leaving for a trip tomorrow and would like to pick up the medication today.  Please call the patient at [phone].

## 2012-02-09 NOTE — Telephone Encounter (Signed)
PT WOULD LIKE TO SPEAK WITH SOMEONE ABOUT A CHANGE IN HIS MEDICATION. PLEASE CALL [phone]

## 2012-02-09 NOTE — Telephone Encounter (Signed)
That's fine.  Please call in whatever the same sig, disp, and refills were from the prev rx but change to 10mg  and have pharmacy cancel the prev 12.5mg .  I assume it's not working for him? Or was it to expensive?

## 2012-02-10 MED ORDER — ZOLPIDEM TARTRATE 10 MG PO TABS: 10.0000 mg | ORAL_TABLET | Freq: Every evening | ORAL | Status: DC | PRN

## 2012-02-10 NOTE — Telephone Encounter (Signed)
Called in new RX for Ambien 10 mg and called patient to notify.

## 2012-02-12 ENCOUNTER — Other Ambulatory Visit: Payer: Self-pay | Admitting: Family Medicine

## 2012-03-02 ENCOUNTER — Telehealth: Payer: Self-pay

## 2012-03-02 MED ORDER — TRAZODONE HCL 50 MG PO TABS: 25.0000 mg | ORAL_TABLET | Freq: Every evening | ORAL | Status: DC | PRN

## 2012-03-02 NOTE — Telephone Encounter (Signed)
Pt called needs a 2 month supply for prescription  Trazodone. Pt is leaving the country tomorrow.  [facility] [address]   Best # for pt [phone] Pt was advised could take up to 24 hrs on phone msg.

## 2012-03-02 NOTE — Telephone Encounter (Signed)
Rx sent to pharmacy   

## 2012-03-03 NOTE — Telephone Encounter (Signed)
LMOM THAT RX WAS SENT IN

## 2012-03-15 ENCOUNTER — Ambulatory Visit: Payer: PRIVATE HEALTH INSURANCE | Admitting: Family Medicine

## 2012-04-03 ENCOUNTER — Telehealth: Payer: Self-pay

## 2012-04-03 NOTE — Telephone Encounter (Signed)
Called patient he is requesting Ambien and Trazadone.

## 2012-04-03 NOTE — Telephone Encounter (Signed)
PATIENT NEEDS TO HAVE HIS RX REFILLED.  HE STATES HE IS GOING OUT OF THE COUNTRY IN 48 HOURS AND NEEDS THEM FILLED BY THE TIME HE LEAVES OUT.  PLEASE CALL [phone]

## 2012-04-05 NOTE — Telephone Encounter (Signed)
Called patient and he is advised. He sounds like he is still in the country, but it is close to 48 hours after this message taken, interesting, every time he calls he indicates he is leaving the country. He states he will see you on 04/15/12. FYI

## 2012-04-05 NOTE — Telephone Encounter (Signed)
Denied.  It looks like pt was hospitalized for something as his medication list has been changed around 1/16-1/18 - (poss alcohol w/d due to the listed meds) but there are no notes in the computer. He needs to sign a release so we can get his discharge records before it is safe to prescribe any further. Likely after his hosp, they prob set him up with a psychiatrist or some sort of f/u who he should now be getting these meds from.  If not, I will be happy to refer him to a psychiatrist.

## 2012-05-05 ENCOUNTER — Other Ambulatory Visit: Payer: Self-pay | Admitting: Family Medicine

## 2012-05-06 NOTE — Telephone Encounter (Signed)
Forward to Dr. Shaw 

## 2012-06-12 ENCOUNTER — Telehealth: Payer: Self-pay

## 2012-06-12 NOTE — Telephone Encounter (Signed)
Pt would like a refill on zolpiden.  Pharmacy:  [facility] [address]

## 2012-06-12 NOTE — Telephone Encounter (Signed)
Pt is calling back because he needs refill on his medication and he is out of town or going out of town. Call back number is [phone]

## 2012-06-12 NOTE — Telephone Encounter (Signed)
Dr. Geoffery Lyons might be a good match for the pt.  Thank you.

## 2012-06-12 NOTE — Telephone Encounter (Signed)
No, as noted by previous requests in the past, I do not feel safe keeping him on the medication. He needs to follow up with his psychiatrist for this or if he is no longer seeing psych I would be happy to refer him to one.

## 2012-06-12 NOTE — Telephone Encounter (Signed)
Called [phone]. Patient advised, he is not seeing a psychiatrist. Does agree to psychiatric referral, advised him he can make own appt, who do you want him to see?

## 2012-06-14 NOTE — Telephone Encounter (Signed)
Dr Dub Mikes phone # [phone] patient is given this information.

## 2014-09-21 ENCOUNTER — Inpatient Hospital Stay (HOSPITAL_COMMUNITY)
Admission: EM | Admit: 2014-09-21 | Discharge: 2014-09-22 | DRG: 433 | Disposition: A | Discharge status: Home / Self Care | Payer: Self-pay | Attending: Internal Medicine | Admitting: Internal Medicine

## 2014-09-21 ENCOUNTER — Emergency Department (HOSPITAL_COMMUNITY): Payer: Self-pay

## 2014-09-21 ENCOUNTER — Encounter (HOSPITAL_COMMUNITY): Payer: Self-pay | Admitting: Family Medicine

## 2014-09-21 DIAGNOSIS — F17210 Nicotine dependence, cigarettes, uncomplicated: Secondary | ICD-10-CM | POA: Diagnosis present

## 2014-09-21 DIAGNOSIS — Z79899 Other long term (current) drug therapy: Secondary | ICD-10-CM

## 2014-09-21 DIAGNOSIS — G4700 Insomnia, unspecified: Secondary | ICD-10-CM | POA: Diagnosis present

## 2014-09-21 DIAGNOSIS — E876 Hypokalemia: Secondary | ICD-10-CM | POA: Diagnosis present

## 2014-09-21 DIAGNOSIS — E871 Hypo-osmolality and hyponatremia: Secondary | ICD-10-CM | POA: Diagnosis present

## 2014-09-21 DIAGNOSIS — D649 Anemia, unspecified: Secondary | ICD-10-CM

## 2014-09-21 DIAGNOSIS — R188 Other ascites: Secondary | ICD-10-CM | POA: Diagnosis present

## 2014-09-21 DIAGNOSIS — E785 Hyperlipidemia, unspecified: Secondary | ICD-10-CM | POA: Diagnosis present

## 2014-09-21 DIAGNOSIS — K7460 Unspecified cirrhosis of liver: Principal | ICD-10-CM | POA: Diagnosis present

## 2014-09-21 DIAGNOSIS — D72829 Elevated white blood cell count, unspecified: Secondary | ICD-10-CM

## 2014-09-21 DIAGNOSIS — F1020 Alcohol dependence, uncomplicated: Secondary | ICD-10-CM | POA: Diagnosis present

## 2014-09-21 LAB — URINALYSIS, ROUTINE W REFLEX MICROSCOPIC
BILIRUBIN URINE: NEGATIVE
Glucose, UA: NEGATIVE mg/dL
Hgb urine dipstick: NEGATIVE
KETONES UR: NEGATIVE mg/dL
Leukocytes, UA: NEGATIVE
NITRITE: NEGATIVE
PROTEIN: NEGATIVE mg/dL
SPECIFIC GRAVITY, URINE: 1.024 (ref 1.005–1.030)
UROBILINOGEN UA: 0.2 mg/dL (ref 0.0–1.0)
pH: 6 (ref 5.0–8.0)

## 2014-09-21 LAB — COMPREHENSIVE METABOLIC PANEL
ALT: 20 U/L (ref 17–63)
ANION GAP: 11 (ref 5–15)
AST: 63 U/L — AB (ref 15–41)
Albumin: 2.6 g/dL — ABNORMAL LOW (ref 3.5–5.0)
Alkaline Phosphatase: 181 U/L — ABNORMAL HIGH (ref 38–126)
CO2: 24 mmol/L (ref 22–32)
CREATININE: 0.49 mg/dL — AB (ref 0.61–1.24)
Calcium: 8.1 mg/dL — ABNORMAL LOW (ref 8.9–10.3)
Chloride: 95 mmol/L — ABNORMAL LOW (ref 101–111)
GFR calc Af Amer: >60 mL/min (ref >60)
Glucose, Bld: 125 mg/dL — ABNORMAL HIGH (ref 65–99)
Potassium: 3 mmol/L — ABNORMAL LOW (ref 3.5–5.1)
Sodium: 130 mmol/L — ABNORMAL LOW (ref 135–145)
TOTAL PROTEIN: 6.8 g/dL (ref 6.5–8.1)
Total Bilirubin: 0.8 mg/dL (ref 0.3–1.2)

## 2014-09-21 LAB — GRAM STAIN

## 2014-09-21 LAB — LACTATE DEHYDROGENASE, PLEURAL OR PERITONEAL FLUID: LD FL: 49 U/L — AB (ref 3–23)

## 2014-09-21 LAB — BODY FLUID CELL COUNT WITH DIFFERENTIAL
Lymphs, Fluid: 34 %
MONOCYTE-MACROPHAGE-SEROUS FLUID: 62 % (ref 50–90)
NEUTROPHIL FLUID: 4 % (ref 0–25)
Total Nucleated Cell Count, Fluid: 320 cu mm (ref 0–1000)

## 2014-09-21 LAB — MAGNESIUM: MAGNESIUM: 1.7 mg/dL (ref 1.7–2.4)

## 2014-09-21 LAB — AMMONIA: Ammonia: 18 umol/L (ref 9–35)

## 2014-09-21 LAB — LIPASE, BLOOD: Lipase: 16 U/L — ABNORMAL LOW (ref 22–51)

## 2014-09-21 LAB — PROTEIN, BODY FLUID

## 2014-09-21 LAB — APTT: aPTT: 33 seconds (ref 24–37)

## 2014-09-21 LAB — CBC
HCT: 26.5 % — ABNORMAL LOW (ref 39.0–52.0)
HEMATOCRIT: 31.4 % — AB (ref 39.0–52.0)
HEMOGLOBIN: 9 g/dL — AB (ref 13.0–17.0)
HEMOGLOBIN: 7.5 g/dL — AB (ref 13.0–17.0)
MCH: 22.6 pg — AB (ref 26.0–34.0)
MCH: 22.7 pg — ABNORMAL LOW (ref 26.0–34.0)
MCHC: 28.7 g/dL — AB (ref 30.0–36.0)
MCHC: 28.3 g/dL — ABNORMAL LOW (ref 30.0–36.0)
MCV: 78.9 fL (ref 78.0–100.0)
MCV: 80.1 fL (ref 78.0–100.0)
Platelets: 845 10*3/uL — ABNORMAL HIGH (ref 150–400)
Platelets: 552 10*3/uL — ABNORMAL HIGH (ref 150–400)
RBC: 3.98 MIL/uL — AB (ref 4.22–5.81)
RBC: 3.31 MIL/uL — ABNORMAL LOW (ref 4.22–5.81)
RDW: 17.5 % — ABNORMAL HIGH (ref 11.5–15.5)
RDW: 17.6 % — ABNORMAL HIGH (ref 11.5–15.5)
WBC: 28.4 10*3/uL — ABNORMAL HIGH (ref 4.0–10.5)
WBC: 19.6 10*3/uL — ABNORMAL HIGH (ref 4.0–10.5)

## 2014-09-21 LAB — DIFFERENTIAL
BASOS PCT: 0 % (ref 0–1)
Basophils Absolute: 0 10*3/uL (ref 0.0–0.1)
EOS PCT: 2 % (ref 0–5)
Eosinophils Absolute: 0.5 10*3/uL (ref 0.0–0.7)
LYMPHS ABS: 3.7 10*3/uL (ref 0.7–4.0)
LYMPHS PCT: 14 % (ref 12–46)
Monocytes Absolute: 2.8 10*3/uL — ABNORMAL HIGH (ref 0.1–1.0)
Monocytes Relative: 11 % (ref 3–12)
NEUTROS PCT: 73 % (ref 43–77)
Neutro Abs: 20.2 10*3/uL — ABNORMAL HIGH (ref 1.7–7.7)

## 2014-09-21 LAB — I-STAT CG4 LACTIC ACID, ED
Lactic Acid, Venous: 2.93 mmol/L (ref 0.5–2.0)
Lactic Acid, Venous: 1.82 mmol/L (ref 0.5–2.0)

## 2014-09-21 LAB — PROTIME-INR
INR: 1.31 (ref 0.00–1.49)
PROTHROMBIN TIME: 16.4 s — AB (ref 11.6–15.2)

## 2014-09-21 LAB — GLUCOSE, PERITONEAL FLUID: GLUCOSE, PERITONEAL FLUID: 126 mg/dL

## 2014-09-21 LAB — CREATININE, SERUM
CREATININE: 0.61 mg/dL (ref 0.61–1.24)
GFR calc Af Amer: >60 mL/min (ref >60)
GFR calc non Af Amer: >60 mL/min (ref >60)

## 2014-09-21 MED ORDER — CITALOPRAM HYDROBROMIDE 20 MG PO TABS
20.0000 mg | ORAL_TABLET | Freq: Every day | ORAL | Status: DC
  Administered 2014-09-22: 20 mg via ORAL
  Filled 2014-09-21: qty 1

## 2014-09-21 MED ORDER — POTASSIUM CHLORIDE 10 MEQ/100ML IV SOLN
10.0000 meq | INTRAVENOUS | Status: DC
  Filled 2014-09-21 (×2): qty 100

## 2014-09-21 MED ORDER — SODIUM CHLORIDE 0.9 % IV SOLN
INTRAVENOUS | Status: AC
  Administered 2014-09-21: 125 mL via INTRAVENOUS

## 2014-09-21 MED ORDER — SODIUM CHLORIDE 0.9 % IV SOLN
INTRAVENOUS | Status: DC
  Administered 2014-09-21: 10 mL via INTRAVENOUS

## 2014-09-21 MED ORDER — POTASSIUM CHLORIDE CRYS ER 20 MEQ PO TBCR: 40.0000 meq | EXTENDED_RELEASE_TABLET | Freq: Once | ORAL | Status: DC

## 2014-09-21 MED ORDER — CEFTRIAXONE SODIUM IN DEXTROSE 40 MG/ML IV SOLN
2.0000 g | INTRAVENOUS | Status: DC
  Administered 2014-09-21: 2 g via INTRAVENOUS
  Filled 2014-09-21 (×2): qty 50

## 2014-09-21 MED ORDER — CITALOPRAM HYDROBROMIDE 40 MG PO TABS: 40.0000 mg | ORAL_TABLET | Freq: Every day | ORAL | Status: DC

## 2014-09-21 MED ORDER — POTASSIUM CHLORIDE 10 MEQ/100ML IV SOLN
10.0000 meq | INTRAVENOUS | Status: DC
  Administered 2014-09-21: 10 meq via INTRAVENOUS
  Filled 2014-09-21 (×3): qty 100

## 2014-09-21 MED ORDER — IOHEXOL 300 MG/ML  SOLN
100.0000 mL | Freq: Once | INTRAMUSCULAR | Status: AC | PRN
  Administered 2014-09-21: 100 mL via INTRAVENOUS

## 2014-09-21 MED ORDER — SPIRONOLACTONE 25 MG PO TABS
25.0000 mg | ORAL_TABLET | Freq: Every day | ORAL | Status: DC
  Administered 2014-09-21 – 2014-09-22 (×2): 25 mg via ORAL
  Filled 2014-09-21 (×2): qty 1

## 2014-09-21 MED ORDER — ONDANSETRON HCL 4 MG/2ML IJ SOLN: 4.0000 mg | Freq: Four times a day (QID) | INTRAMUSCULAR | Status: DC | PRN

## 2014-09-21 MED ORDER — ACETAMINOPHEN 325 MG PO TABS: 650.0000 mg | ORAL_TABLET | Freq: Four times a day (QID) | ORAL | Status: DC | PRN

## 2014-09-21 MED ORDER — CEFTRIAXONE SODIUM IN DEXTROSE 40 MG/ML IV SOLN
2.0000 g | INTRAVENOUS | Status: DC
  Filled 2014-09-21: qty 50

## 2014-09-21 MED ORDER — IOHEXOL 300 MG/ML  SOLN
25.0000 mL | Freq: Once | INTRAMUSCULAR | Status: AC | PRN
  Administered 2014-09-21: 25 mL via ORAL

## 2014-09-21 MED ORDER — POTASSIUM CHLORIDE CRYS ER 20 MEQ PO TBCR
40.0000 meq | EXTENDED_RELEASE_TABLET | Freq: Once | ORAL | Status: AC
  Administered 2014-09-21: 40 meq via ORAL
  Filled 2014-09-21: qty 2

## 2014-09-21 MED ORDER — ENOXAPARIN SODIUM 40 MG/0.4ML ~~LOC~~ SOLN
40.0000 mg | SUBCUTANEOUS | Status: DC
  Filled 2014-09-21 (×2): qty 0.4

## 2014-09-21 MED ORDER — ONDANSETRON HCL 4 MG PO TABS: 4.0000 mg | ORAL_TABLET | Freq: Four times a day (QID) | ORAL | Status: DC | PRN

## 2014-09-21 MED ORDER — SODIUM CHLORIDE 0.9 % IV BOLUS (SEPSIS)
1000.0000 mL | Freq: Once | INTRAVENOUS | Status: AC
  Administered 2014-09-21: 1000 mL via INTRAVENOUS

## 2014-09-21 MED ORDER — ACETAMINOPHEN 650 MG RE SUPP: 650.0000 mg | Freq: Four times a day (QID) | RECTAL | Status: DC | PRN

## 2014-09-21 MED ORDER — ZOLPIDEM TARTRATE 5 MG PO TABS
5.0000 mg | ORAL_TABLET | Freq: Every evening | ORAL | Status: DC | PRN
  Administered 2014-09-21: 5 mg via ORAL
  Filled 2014-09-21: qty 1

## 2014-09-21 MED ORDER — SODIUM CHLORIDE 0.9 % IV SOLN
Freq: Once | INTRAVENOUS | Status: AC
  Administered 2014-09-21: 10:00:00 via INTRAVENOUS

## 2014-09-21 MED ORDER — FUROSEMIDE 20 MG PO TABS
20.0000 mg | ORAL_TABLET | Freq: Every day | ORAL | Status: DC
  Administered 2014-09-21 – 2014-09-22 (×2): 20 mg via ORAL
  Filled 2014-09-21 (×2): qty 1

## 2014-09-21 MED ORDER — LIDOCAINE HCL (PF) 1 % IJ SOLN
INTRAMUSCULAR | Status: AC
  Filled 2014-09-21: qty 10

## 2014-09-21 NOTE — Procedures (Signed)
Successful US guided paracentesis from RLQ.  Yielded 3.2L of clear yellow fluid.  No immediate complications.  Pt tolerated well.   Specimen was sent for labs.  Ascencion Dike PA-C 09/21/2014 12:33 PM

## 2014-09-21 NOTE — ED Provider Notes (Signed)
CSN: [id number]     Arrival date & time 09/21/14  2671 History   First MD Initiated Contact with Patient 09/21/14 [phone]     Chief Complaint  Patient presents with  . Bloated     (Consider location/radiation/quality/duration/timing/severity/associated sxs/prior Treatment) HPI [name] is a 49 y.o. male with history of alcohol abuse, presents to emergency department complaining of abdominal distention, weight loss, insomnia. Patient states he quit heavy drinking of approximately month and half ago, states since then he has noticed that his abdomen has been getting more distended. He reports pain in the lower abdomen. He denies any nausea or vomiting. He states he has decreased appetite. He has lost approximately 15 pounds in the last month. He states he is having loose stools. Also reports urinary frequency. Denies any fever or chills. States he is unable to sleep at night because of frequent trips to the bathroom. He states he still drinks alcohol but only 1-3 beers a day. Denies history of abdominal problems in the past.  Past Medical History  Diagnosis Date  . Alcoholism   . Hyperlipemia    Past Surgical History  Procedure Laterality Date  . No past surgeries     Family History  Problem Relation Age of Onset  . Coronary artery disease    . Diabetes type II     History  Substance Use Topics  . Smoking status: Current Every Day Smoker -- 0.50 packs/day    Types: Cigarettes  . Smokeless tobacco: Never Used  . Alcohol Use: Yes     Comment: pt states last drink was 3 days ago    Review of Systems  Constitutional: Positive for appetite change, fatigue and unexpected weight change. Negative for fever and chills.  Respiratory: Negative for cough, chest tightness and shortness of breath.   Cardiovascular: Negative for chest pain, palpitations and leg swelling.  Gastrointestinal: Positive for abdominal pain and abdominal distention. Negative for nausea, vomiting and diarrhea.   Genitourinary: Negative for dysuria, urgency, frequency and hematuria.  Musculoskeletal: Negative for myalgias, arthralgias, neck pain and neck stiffness.  Skin: Negative for rash.  Allergic/Immunologic: Negative for immunocompromised state.  Neurological: Positive for weakness. Negative for dizziness, light-headedness, numbness and headaches.  All other systems reviewed and are negative.     Allergies  Review of patient's allergies indicates no known allergies.  Home Medications   Prior to Admission medications   Medication Sig Start Date End Date Taking? Authorizing Provider  citalopram (CELEXA) 20 MG tablet Take 1/2 tab po qd x 6 days, then 1 tab po qd. 01/12/12   Shawnee Knapp, MD  citalopram (CELEXA) 40 MG tablet Take 1 tablet (40 mg total) by mouth daily. Start next mo after completing 20mg  course 01/12/12   Shawnee Knapp, MD  sildenafil (VIAGRA) 50 MG tablet Take 1 tablet (50 mg total) by mouth as needed for erectile dysfunction. 12/20/11   Shawnee Knapp, MD  traZODone (DESYREL) 50 MG tablet Take 0.5-1 tablets (25-50 mg total) by mouth at bedtime as needed for sleep. 03/02/12   Heather M Marte, PA-C   BP 119/82 mmHg  Pulse 127  Temp(Src) 98.3 F (36.8 C) (Oral)  Resp 20  SpO2 100% Physical Exam  Constitutional: He appears well-developed and well-nourished. No distress.  HENT:  Head: Normocephalic and atraumatic.  Eyes: Conjunctivae are normal.  Neck: Neck supple.  Cardiovascular: Normal rate, regular rhythm and normal heart sounds.   Pulmonary/Chest: Effort normal. No respiratory distress. He has no wheezes.  He has no rales.  Abdominal: Soft. Bowel sounds are normal. He exhibits distension. There is no tenderness. There is no rebound.  Ascites present  Musculoskeletal: He exhibits no edema.  Neurological: He is alert.  Skin: Skin is warm and dry.  Nursing note and vitals reviewed.   ED Course  Procedures (including critical care time) Labs Review Labs Reviewed  LIPASE,  BLOOD - Abnormal; Notable for the following:    Lipase 16 (*)    All other components within normal limits  COMPREHENSIVE METABOLIC PANEL - Abnormal; Notable for the following:    Sodium 130 (*)    Potassium 3.0 (*)    Chloride 95 (*)    Glucose, Bld 125 (*)    BUN <5 (*)    Creatinine, Ser 0.49 (*)    Calcium 8.1 (*)    Albumin 2.6 (*)    AST 63 (*)    Alkaline Phosphatase 181 (*)    All other components within normal limits  CBC - Abnormal; Notable for the following:    WBC 28.4 (*)    RBC 3.98 (*)    Hemoglobin 9.0 (*)    HCT 31.4 (*)    MCH 22.6 (*)    MCHC 28.7 (*)    RDW 17.5 (*)    Platelets 845 (*)    All other components within normal limits  URINALYSIS, ROUTINE W REFLEX MICROSCOPIC (NOT AT [facility])  I-STAT CG4 LACTIC ACID, ED    Imaging Review Ct Abdomen Pelvis W Contrast  09/21/2014   CLINICAL DATA:  Abdominal pain and swelling. History of alcohol abuse.  EXAM: CT ABDOMEN AND PELVIS WITH CONTRAST  TECHNIQUE: Multidetector CT imaging of the abdomen and pelvis was performed using the standard protocol following bolus administration of intravenous contrast.  CONTRAST:  161mL OMNIPAQUE IOHEXOL 300 MG/ML  SOLN  COMPARISON:  None.  FINDINGS: Lower chest: Mild atelectasis in the lung bases. Negative for infiltrate or effusion.  Hepatobiliary: Mild enlargement of the left lobe of the liver. 8 mm hypodensity left lobe of the liver is indeterminate as it is too small to characterize but probably benign. Liver capsule is smooth. Gallbladder normal. Bile ducts normal.  Pancreas: Normal.  Negative for mass or edema  Spleen: Normal splenic size  Adrenals/Urinary Tract: Normal kidneys and adrenal glands. No renal mass or obstruction. Urinary bladder normal.  Stomach/Bowel: Negative for bowel obstruction.  No bowel edema.  Vascular/Lymphatic: Atherosclerotic calcification aorta and iliac arteries without aneurysm.  Reproductive: Prostate mildly enlarged with small calcifications.  Other: Large  amount of ascites. There is fluid in all 4 quadrants. There is some stranding in the omentum which is likely related to ascites. No definite mass lesion.  Musculoskeletal: Mild lumbar disc degeneration.  No bony lesion.  IMPRESSION: Large amount of ascites. Probable underlying liver disease with mild enlargement of the left lobe liver. Findings are suggestive of early cirrhosis. 8 mm indeterminate lesion left lobe of the liver. Negative for mass lesion.   Electronically Signed   By: Franchot Gallo M.D.   On: 09/21/2014 10:24     EKG Interpretation None      MDM   Final diagnoses:  Ascites   Pt with new onset ascites, weakness, weight loss. History of long standing alcohol abuse. Will get labs, CT abd pelvis.   pts WBC is 28.4, he is tachycardic. BP normal. Pt non toxic appearing. Will start fluid resucitation, lactic pending. Concerning for SBP.   11:43 AM CT showing acites and early  cirrhosis. Spoke with IR, will do US paracentesis. Will get pt admitted to medicine. Pt's heart rate improving with IV fluids.   Filed Vitals:   09/21/14 0739 09/21/14 0928  BP: 119/82 112/81  Pulse: 127 107  Temp: 98.3 F (36.8 C)   TempSrc: Oral   Resp: 20 18  SpO2: 100% 99%     Jeannett Senior, PA-C 09/21/14 1517  Tanna Furry, MD 09/26/14 [phone]

## 2014-09-21 NOTE — ED Notes (Signed)
Pt transported to CT scan.

## 2014-09-21 NOTE — ED Provider Notes (Signed)
Pt seen and evaluated.  Reports heavy drinking until 1 month ago. Now Just "a few beers every few days".  AP and abd distention for 3-4 weeks.  Poor appetite and weight loss.  Exam with distention, shifting dullness, and bedside US with + Ascites.  WBC 28K.  Plan Paracentesis, and admission for probable SBP. Pt not unstable/septic. Afebrile, HR improves with IVF.  Tanna Furry, MD 09/21/14 1213

## 2014-09-21 NOTE — ED Notes (Signed)
Attempted to give report, unable. Nurse to call back.

## 2014-09-21 NOTE — Progress Notes (Signed)
Call received from [facility], [place] and informed her that pt refused IV KCL as is burning iv site and wants po instead.  Also informed her that pharmacy need dose changed on celexa.  Instructed she will make the orders.  Karie Kirks, Therapist, sports.

## 2014-09-21 NOTE — ED Notes (Signed)
Pt returned to exam room from ultrasound. Pt resting quietly at the time. Vital signs stable. No signs of distress noted.

## 2014-09-21 NOTE — Progress Notes (Signed)
Return call received from [facility] who is on [facility] admissions & consults.  Informing nurse that been trying to call  Dr Darrick Meigs r/t [facility] IV  Pt unable to tolerate IV KCL  and celexa  Dose pharmacy needing clarification.  Instructed that Dr.Lama might have been gone home and will try to call him and will call back.  Karie Kirks, Therapist, sports.

## 2014-09-21 NOTE — ED Notes (Addendum)
Pt states abdominal distention, feeling of being bloated and abdominal pain x1 month. Also states decreased appetite and nausea. Abdomen distended upon arrival to ED. 9/10 pain at the time. Bowel sounds present all quadrants.

## 2014-09-21 NOTE — ED Notes (Signed)
Pt in ultrasound at the time.

## 2014-09-21 NOTE — ED Notes (Signed)
Pt here for abd pain and swelling x over a month. Pt sts recently quit drinking heavy. sts currently 3 beers a day.

## 2014-09-21 NOTE — H&P (Addendum)
PCP:   No primary care provider on file.   Chief Complaint:  Abdominal pain  HPI: 49 yr old male who   has a past medical history of Alcoholism and Hyperlipemia. today comes to the hospital with complaint of abdominal distention which is going on for a month. Patient reports pain in the lower abdomen and back because of the increased abdominal girth. He denies nausea vomiting. Denies diarrhea. Patient complains of insomnia at night because of frequent trips to the bathroom. Patient told me that he quit alcohol 2 months ago but ED physician notes stated that patient drinks 1-3 beers a day. Patient underwent paracentesis in the ED. Ascitic fluid has been sent for fluid analysis for possible SBP Patient also found to have increased white count 28,000.  Allergies:  No Known Allergies    Past Medical History  Diagnosis Date  . Alcoholism   . Hyperlipemia     Past Surgical History  Procedure Laterality Date  . No past surgeries      Prior to Admission medications   Medication Sig Start Date End Date Taking? Authorizing Provider  ibuprofen (ADVIL,MOTRIN) 200 MG tablet Take 400 mg by mouth every 6 (six) hours as needed (pain/sleep).   Yes Historical Provider, MD  citalopram (CELEXA) 20 MG tablet Take 1/2 tab po qd x 6 days, then 1 tab po qd. Patient not taking: Reported on 09/21/2014 01/12/12   Shawnee Knapp, MD  citalopram (CELEXA) 40 MG tablet Take 1 tablet (40 mg total) by mouth daily. Start next mo after completing 20mg  course Patient not taking: Reported on 09/21/2014 01/12/12   Shawnee Knapp, MD  sildenafil (VIAGRA) 50 MG tablet Take 1 tablet (50 mg total) by mouth as needed for erectile dysfunction. Patient not taking: Reported on 09/21/2014 12/20/11   Shawnee Knapp, MD  traZODone (DESYREL) 50 MG tablet Take 0.5-1 tablets (25-50 mg total) by mouth at bedtime as needed for sleep. Patient not taking: Reported on 09/21/2014 03/02/12   Collene Leyden, PA-C    Social History:  reports that he  has been smoking Cigarettes.  He has been smoking about 0.50 packs per day. He has never used smokeless tobacco. He reports that he drinks alcohol. He reports that he does not use illicit drugs.  Family History  Problem Relation Age of Onset  . Coronary artery disease    . Diabetes type II       All the positives are listed in BOLD  Review of Systems:  HEENT: Headache, blurred vision, runny nose, sore throat Neck: Hypothyroidism, hyperthyroidism,,lymphadenopathy Chest : Shortness of breath, history of COPD, Asthma Heart : Chest pain, history of coronary arterey disease GI:  Nausea, vomiting, diarrhea, constipation, GERD GU: Dysuria, urgency, frequency of urination, hematuria Neuro: Stroke, seizures, syncope Psych: Depression, anxiety, hallucinations   Physical Exam: Blood pressure 107/72, pulse 103, temperature 98.3 F (36.8 C), temperature source Oral, resp. rate 18, SpO2 100 %. Constitutional:   Patient is a well-developed and well-nourished male* in no acute distress and cooperative with exam. Head: Normocephalic and atraumatic Mouth: Mucus membranes moist Eyes: PERRL, EOMI, conjunctivae normal Neck: Supple, No Thyromegaly Cardiovascular: RRR, S1 normal, S2 normal Pulmonary/Chest: CTAB, no wheezes, rales, or rhonchi Abdominal: Soft. Mild tenderness in the right lower quadrant , marked distention, bowel sounds are normal, no masses, organomegaly, or guarding present.  Neurological: A&O x3, Strength is normal and symmetric bilaterally, cranial nerve II-XII are grossly intact, no focal motor deficit, sensory intact to light  touch bilaterally.  Extremities : No Cyanosis, Clubbing or Edema  Labs on Admission:  Basic Metabolic Panel:  Recent Labs Lab 09/21/14 0745  NA 130*  K 3.0*  CL 95*  CO2 24  GLUCOSE 125*  BUN <5*  CREATININE 0.49*  CALCIUM 8.1*   Liver Function Tests:  Recent Labs Lab 09/21/14 0745  AST 63*  ALT 20  ALKPHOS 181*  BILITOT 0.8  PROT 6.8    ALBUMIN 2.6*    Recent Labs Lab 09/21/14 0745  LIPASE 16*    Recent Labs Lab 09/21/14 1053  AMMONIA 18   CBC:  Recent Labs Lab 09/21/14 0745  WBC 28.4*  NEUTROABS 20.2*  HGB 9.0*  HCT 31.4*  MCV 78.9  PLT 845*    Radiological Exams on Admission: Ct Abdomen Pelvis W Contrast  09/21/2014   CLINICAL DATA:  Abdominal pain and swelling. History of alcohol abuse.  EXAM: CT ABDOMEN AND PELVIS WITH CONTRAST  TECHNIQUE: Multidetector CT imaging of the abdomen and pelvis was performed using the standard protocol following bolus administration of intravenous contrast.  CONTRAST:  113mL OMNIPAQUE IOHEXOL 300 MG/ML  SOLN  COMPARISON:  None.  FINDINGS: Lower chest: Mild atelectasis in the lung bases. Negative for infiltrate or effusion.  Hepatobiliary: Mild enlargement of the left lobe of the liver. 8 mm hypodensity left lobe of the liver is indeterminate as it is too small to characterize but probably benign. Liver capsule is smooth. Gallbladder normal. Bile ducts normal.  Pancreas: Normal.  Negative for mass or edema  Spleen: Normal splenic size  Adrenals/Urinary Tract: Normal kidneys and adrenal glands. No renal mass or obstruction. Urinary bladder normal.  Stomach/Bowel: Negative for bowel obstruction.  No bowel edema.  Vascular/Lymphatic: Atherosclerotic calcification aorta and iliac arteries without aneurysm.  Reproductive: Prostate mildly enlarged with small calcifications.  Other: Large amount of ascites. There is fluid in all 4 quadrants. There is some stranding in the omentum which is likely related to ascites. No definite mass lesion.  Musculoskeletal: Mild lumbar disc degeneration.  No bony lesion.  IMPRESSION: Large amount of ascites. Probable underlying liver disease with mild enlargement of the left lobe liver. Findings are suggestive of early cirrhosis. 8 mm indeterminate lesion left lobe of the liver. Negative for mass lesion.   Electronically Signed   By: Franchot Gallo M.D.   On:  09/21/2014 10:24    EKG: Independently reviewed. Sinus tachycardia   Assessment/Plan Active Problems:   Ascites   Liver cirrhosis   Hypokalemia   Hyponatremia  Ascites Likely from the alcohol liver disease, CT shows mild enlargement of the left lobe of liver findings just above only cirrhosis. Patient underwent paracentesis with removal of 3.2 L of clear yellow fluid Ascitic fluid has been sent for analysis for possible infection  Liver cirrhosis Patient has developed liver cirrhosis from alcohol liver disease, as seen on th CT abdomen Will start Lasix 20 mg daily Check ammonia level Aldactone 25 mg by mouth daily   ? SBP Patient has [place] of [address] Will empirically start the patient on Rocephin  Hypokalemia Replace potassium Check serum magnesium  Hyponatremia Likely from hypervolemic hyponatremia from liver cirrhosis Follow BMP in a.m. Will obtain serum and urine osmolality  Insomnia We'll start Ambien 5 mg by mouth daily at bedtime when necessary.   Code status: Full code  Family discussion: No family at bedside   Time Spent on Admission: 17 min  [facility] Pager: [phone] 09/21/2014, 1:03 PM  If 7PM-7AM, please contact night-coverage  [email or web address]  Password [email or web address]

## 2014-09-21 NOTE — Progress Notes (Signed)
Pt unable to tolerate iv KCL c/o burning at iv site and stated want to take po kcl instead.  Notified Dr. Eber Jones return call.

## 2014-09-21 NOTE — Progress Notes (Addendum)
ANTIBIOTIC CONSULT NOTE - INITIAL  Pharmacy Consult for ceftriaxone Indication: intra-abdominal infection/ possible SBP  No Known Allergies  Patient Measurements: Height: 5\' 6"  (167.6 cm) Weight: 125 lb 9.6 oz (56.972 kg) IBW/kg (Calculated) : 63.8  Vital Signs: Temp: 98.4 F (36.9 C) (07/25 1456) Temp Source: Oral (07/25 1456) BP: 93/65 mmHg (07/25 1456) Pulse Rate: 112 (07/25 1456) Intake/Output from previous day:   Intake/Output from this shift: Total I/O In: 1000 [I.V.:1000] Out: -   Labs:  Recent Labs  09/21/14 0745  WBC 28.4*  HGB 9.0*  PLT 845*  CREATININE 0.49*   Estimated Creatinine Clearance: 89.1 mL/min (by C-G formula based on Cr of 0.49).   Microbiology: Recent Results (from the past 720 hour(s))  Culture, body fluid-bottle     Status: None (Preliminary result)   Collection Time: 09/21/14 12:13 PM  Result Value Ref Range Status   Specimen Description PERITONEAL  Final   Special Requests BAA 10MLS  Final   Culture PENDING  Incomplete   Report Status PENDING  Incomplete  Gram stain     Status: None   Collection Time: 09/21/14 12:13 PM  Result Value Ref Range Status   Specimen Description PERITONEAL  Final   Special Requests NONE  Final   Gram Stain   Final    FEW WBC PRESENT,BOTH PMN AND MONONUCLEAR NO ORGANISMS SEEN GRAM STAIN REVIEWED-AGREE WITH RESULT L [place]    Report Status 09/21/2014 FINAL  Final    Medical History: Past Medical History  Diagnosis Date  . Alcoholism   . Hyperlipemia     Assessment: [name] is a 49 y.o. male who was admitted with abdominal distention, ascites, liver cirrhosis, and possible SBP. Patient has elevated WBC of 28.4 and is afebrile. Body fluid culture results from peritoneal fluid is pending.   Goal of Therapy:  Eradication of infection  Plan:  - Initiate ceftriaxone 2g IV q24h - Follow up culture results  Nicoletta Ba A 09/21/2014,3:29 PM   Addendum:  Ceftriaxone has no renal  adjustments. Pt on appropriate dose, pharmacy to sign off. Please reconsult pharmacy if needed.  Angela Burke, PharmD Pharmacy Resident Pager: [phone]

## 2014-09-22 DIAGNOSIS — K652 Spontaneous bacterial peritonitis: Secondary | ICD-10-CM

## 2014-09-22 DIAGNOSIS — R188 Other ascites: Secondary | ICD-10-CM

## 2014-09-22 DIAGNOSIS — K7031 Alcoholic cirrhosis of liver with ascites: Secondary | ICD-10-CM

## 2014-09-22 DIAGNOSIS — E876 Hypokalemia: Secondary | ICD-10-CM

## 2014-09-22 LAB — COMPREHENSIVE METABOLIC PANEL
ALT: 14 U/L — AB (ref 17–63)
AST: 44 U/L — AB (ref 15–41)
Albumin: 2.1 g/dL — ABNORMAL LOW (ref 3.5–5.0)
Alkaline Phosphatase: 149 U/L — ABNORMAL HIGH (ref 38–126)
Anion gap: 10 (ref 5–15)
CALCIUM: 7.7 mg/dL — AB (ref 8.9–10.3)
CO2: 27 mmol/L (ref 22–32)
CREATININE: 0.51 mg/dL — AB (ref 0.61–1.24)
Chloride: 98 mmol/L — ABNORMAL LOW (ref 101–111)
GFR calc non Af Amer: >60 mL/min (ref >60)
GLUCOSE: 74 mg/dL (ref 65–99)
Potassium: 3.1 mmol/L — ABNORMAL LOW (ref 3.5–5.1)
Sodium: 135 mmol/L (ref 135–145)
TOTAL PROTEIN: 6 g/dL — AB (ref 6.5–8.1)
Total Bilirubin: 0.7 mg/dL (ref 0.3–1.2)

## 2014-09-22 LAB — CBC
HCT: 27.9 % — ABNORMAL LOW (ref 39.0–52.0)
HEMOGLOBIN: 7.9 g/dL — AB (ref 13.0–17.0)
MCH: 22.4 pg — ABNORMAL LOW (ref 26.0–34.0)
MCHC: 28.3 g/dL — ABNORMAL LOW (ref 30.0–36.0)
MCV: 79.3 fL (ref 78.0–100.0)
Platelets: 577 10*3/uL — ABNORMAL HIGH (ref 150–400)
RBC: 3.52 MIL/uL — ABNORMAL LOW (ref 4.22–5.81)
RDW: 17.7 % — ABNORMAL HIGH (ref 11.5–15.5)
WBC: 20.9 10*3/uL — AB (ref 4.0–10.5)

## 2014-09-22 LAB — OSMOLALITY: Osmolality: 278 mOsm/kg (ref 275–300)

## 2014-09-22 LAB — OSMOLALITY, URINE: Osmolality, Ur: 447 mOsm/kg (ref 390–1090)

## 2014-09-22 MED ORDER — LACTULOSE 10 GM/15ML PO SOLN
20.0000 g | Freq: Once | ORAL | Status: AC
  Administered 2014-09-22: 20 g via ORAL
  Filled 2014-09-22: qty 30

## 2014-09-22 MED ORDER — SPIRONOLACTONE 50 MG PO TABS: 50.0000 mg | ORAL_TABLET | Freq: Every day | ORAL | Status: DC

## 2014-09-22 MED ORDER — POTASSIUM CHLORIDE CRYS ER 20 MEQ PO TBCR
40.0000 meq | EXTENDED_RELEASE_TABLET | Freq: Once | ORAL | Status: AC
  Administered 2014-09-22: 40 meq via ORAL
  Filled 2014-09-22: qty 2

## 2014-09-22 MED ORDER — TRAZODONE HCL 50 MG PO TABS: 50.0000 mg | ORAL_TABLET | Freq: Every evening | ORAL | Status: DC | PRN

## 2014-09-22 MED ORDER — CIPROFLOXACIN HCL 500 MG PO TABS: 500.0000 mg | ORAL_TABLET | Freq: Two times a day (BID) | ORAL | Status: DC

## 2014-09-22 MED ORDER — FUROSEMIDE 20 MG PO TABS: 20.0000 mg | ORAL_TABLET | Freq: Every day | ORAL | Status: DC

## 2014-09-22 NOTE — Discharge Summary (Signed)
Physician Discharge Summary  [name] WEX:[id number] DOB: 1965-11-30 DOA: 09/21/2014  PCP: No primary care provider on file.  Admit date: 09/21/2014 Discharge date: 09/22/2014  Time spent: 35 minutes  Recommendations for Outpatient Follow-up:  1. Patient diagnosed with cirrhosis, case discussed with GI, he was given a follow-up appointment with [facility] for Thurs Aug 4 at 10 am 2. He was discharged on Lasix 20 mg by mouth daily and spironolactone 50 mg by mouth daily, please follow-up on BMP 3. Please follow-up on hepatitis B panel, hepatitis C, anti-smooth antibodies, mitochondrial antibodies, HIV antibody alpha-fetoprotein.  4. Prior to discharge case manager was consulted to set patient up with local PCP  Discharge Diagnoses:  Active Problems:   Ascites   Liver cirrhosis   Hypokalemia   Hyponatremia   Discharge Condition: Stable/improved  Diet recommendation: Low sodium diet  Filed Weights   09/21/14 1456 09/22/14 0239  Weight: 56.972 kg (125 lb 9.6 oz) 56.745 kg (125 lb 1.6 oz)    History of present illness:  49 yr old male who  has a past medical history of Alcoholism and Hyperlipemia. today comes to the hospital with complaint of abdominal distention which is going on for a month. Patient reports pain in the lower abdomen and back because of the increased abdominal girth. He denies nausea vomiting. Denies diarrhea. Patient complains of insomnia at night because of frequent trips to the bathroom. Patient told me that he quit alcohol 2 months ago but ED physician notes stated that patient drinks 1-3 beers a day. Patient underwent paracentesis in the ED. Ascitic fluid has been sent for fluid analysis for possible SBP Patient also found to have increased white count 28,000.  Hospital Course:  Patient is a pleasant 49 year old gentleman with a past history of chronic alcohol some presented to the emergency department on 09/21/2014 with complaints of abdominal  distention associate with abdominal pain. Initial lab work revealed an elevated white count of 20,000. He was further worked up with a CT scan of abdomen and pelvis that revealed large amount of ascites, probable underlying liver disease with mild enlargement of the left liver lobe. Radiology reporting findings consistent with early cirrhosis. Also noted was an 8 mm indeterminate lesion in the left lobe. Spontaneous bacterial peritonitis was suspected as he was initially started on IV ceftriaxone. Patient underwent ultrasound-guided paracentesis with removal of 3.2 L of ascitic fluid. By the following morning he reported feeling much better and asking to go home. I discussed case with GI. Multiple labs ordered including hepatitis B panel, hepatitis C, anti-mitochondrial antibodies, anti-smooth muscle antibodies, HIV, alpha-fetoprotein. Patient was discharged on Cipro 500 mg by mouth twice a day along with 20 mg of Lasix and 50 mg of spironolactone. He was scheduled to follow-up with GI on August 4 at 10 AM.  Procedures:  Ultrasound-guided paracentesis with removal of 3.2 L of ascitic fluid  Consultations:  Telephone consultation to GI  Discharge Exam: Filed Vitals:   09/22/14 1334  BP: 114/85  Pulse: 103  Temp: 98.3 F (36.8 C)  Resp: 18    General: Chronically ill-appearing, cachectic, no acute distress, asking to go home today. Cardiovascular: Regular rate and rhythm normal S1-S2 no murmurs rubs or gallops Respiratory: Normal respiratory effort, lungs are clear Abdomen: Distended, having a positive fluid, nontender Extremities: No edema  Discharge Instructions   Discharge Instructions    Call MD for:  difficulty breathing, headache or visual disturbances    Complete by:  As directed  Call MD for:  extreme fatigue    Complete by:  As directed      Call MD for:  hives    Complete by:  As directed      Call MD for:  persistant dizziness or light-headedness    Complete by:  As  directed      Call MD for:  persistant nausea and vomiting    Complete by:  As directed      Call MD for:  redness, tenderness, or signs of infection (pain, swelling, redness, odor or green/yellow discharge around incision site)    Complete by:  As directed      Call MD for:  severe uncontrolled pain    Complete by:  As directed      Call MD for:  temperature >100.4    Complete by:  As directed      Diet - low sodium heart healthy    Complete by:  As directed      Diet - low sodium heart healthy    Complete by:  As directed      Discharge instructions    Complete by:  As directed   Please follow up with [facility] on Thurs Oct 01, 2014 at 10 am [place] On 3rd floor     Increase activity slowly    Complete by:  As directed      Increase activity slowly    Complete by:  As directed           Current Discharge Medication List    START taking these medications   Details  furosemide (LASIX) 20 MG tablet Take 1 tablet (20 mg total) by mouth daily. Qty: 30 tablet, Refills: 1    spironolactone (ALDACTONE) 50 MG tablet Take 1 tablet (50 mg total) by mouth daily. Qty: 30 tablet, Refills: 0      CONTINUE these medications which have NOT CHANGED   Details  citalopram (CELEXA) 40 MG tablet Take 1 tablet (40 mg total) by mouth daily. Start next mo after completing 20mg  course Qty: 30 tablet, Refills: 1    sildenafil (VIAGRA) 50 MG tablet Take 1 tablet (50 mg total) by mouth as needed for erectile dysfunction. Qty: 5 tablet, Refills: 0    traZODone (DESYREL) 50 MG tablet Take 0.5-1 tablets (25-50 mg total) by mouth at bedtime as needed for sleep. Qty: 60 tablet, Refills: 0      STOP taking these medications     ibuprofen (ADVIL,MOTRIN) 200 MG tablet        No Known Allergies Follow-up Information    Follow up with [facility]  On 10/01/2014.   Why:  Appt. @ 10;00am   Contact information:   [phone] [place], [place] 3rd floor       The  results of significant diagnostics from this hospitalization (including imaging, microbiology, ancillary and laboratory) are listed below for reference.    Significant Diagnostic Studies: Ct Abdomen Pelvis W Contrast  09/21/2014   CLINICAL DATA:  Abdominal pain and swelling. History of alcohol abuse.  EXAM: CT ABDOMEN AND PELVIS WITH CONTRAST  TECHNIQUE: Multidetector CT imaging of the abdomen and pelvis was performed using the standard protocol following bolus administration of intravenous contrast.  CONTRAST:  125mL OMNIPAQUE IOHEXOL 300 MG/ML  SOLN  COMPARISON:  None.  FINDINGS: Lower chest: Mild atelectasis in the lung bases. Negative for infiltrate or effusion.  Hepatobiliary: Mild enlargement of the left lobe of the  liver. 8 mm hypodensity left lobe of the liver is indeterminate as it is too small to characterize but probably benign. Liver capsule is smooth. Gallbladder normal. Bile ducts normal.  Pancreas: Normal.  Negative for mass or edema  Spleen: Normal splenic size  Adrenals/Urinary Tract: Normal kidneys and adrenal glands. No renal mass or obstruction. Urinary bladder normal.  Stomach/Bowel: Negative for bowel obstruction.  No bowel edema.  Vascular/Lymphatic: Atherosclerotic calcification aorta and iliac arteries without aneurysm.  Reproductive: Prostate mildly enlarged with small calcifications.  Other: Large amount of ascites. There is fluid in all 4 quadrants. There is some stranding in the omentum which is likely related to ascites. No definite mass lesion.  Musculoskeletal: Mild lumbar disc degeneration.  No bony lesion.  IMPRESSION: Large amount of ascites. Probable underlying liver disease with mild enlargement of the left lobe liver. Findings are suggestive of early cirrhosis. 8 mm indeterminate lesion left lobe of the liver. Negative for mass lesion.   Electronically Signed   By: Franchot Gallo M.D.   On: 09/21/2014 10:24   US Paracentesis  09/21/2014   CLINICAL DATA:  New onset  abdominal distention. Ascites. Request therapeutic and diagnostic paracentesis.  EXAM: ULTRASOUND GUIDED PARACENTESIS  COMPARISON:  Right lower  PROCEDURE: An ultrasound guided paracentesis was thoroughly discussed with the patient and questions answered. The benefits, risks, alternatives and complications were also discussed. The patient understands and wishes to proceed with the procedure. Written consent was obtained.  Ultrasound was performed to localize and mark an adequate pocket of fluid in the right lower quadrant of the abdomen. The area was then prepped and draped in the normal sterile fashion. 1% Lidocaine was used for local anesthesia. Under ultrasound guidance a 19 gauge Yueh catheter was introduced. Paracentesis was performed. The catheter was removed and a dressing applied.  COMPLICATIONS: None immediate  FINDINGS: A total of approximately 3.2 L of clear yellow fluid was removed. A fluid sample was sent for laboratory analysis.  IMPRESSION: Successful ultrasound guided paracentesis yielding 3.2 L of ascites.  Read by: Ascencion Dike PA-C   Electronically Signed   By: Sandi Mariscal M.D.   On: 09/21/2014 12:34    Microbiology: Recent Results (from the past 240 hour(s))  Culture, body fluid-bottle     Status: None (Preliminary result)   Collection Time: 09/21/14 12:13 PM  Result Value Ref Range Status   Specimen Description PERITONEAL  Final   Special Requests BAA 10MLS  Final   Culture NO GROWTH 1 DAY  Final   Report Status PENDING  Incomplete  Gram stain     Status: None   Collection Time: 09/21/14 12:13 PM  Result Value Ref Range Status   Specimen Description PERITONEAL  Final   Special Requests NONE  Final   Gram Stain   Final    FEW WBC PRESENT,BOTH PMN AND MONONUCLEAR NO ORGANISMS SEEN GRAM STAIN REVIEWED-AGREE WITH RESULT L [place]    Report Status 09/21/2014 FINAL  Final     Labs: Basic Metabolic Panel:  Recent Labs Lab 09/21/14 0745 09/21/14 1658 09/22/14 0357  NA  130*  --  135  K 3.0*  --  3.1*  CL 95*  --  98*  CO2 24  --  27  GLUCOSE 125*  --  74  BUN <5*  --  <5*  CREATININE 0.49* 0.61 0.51*  CALCIUM 8.1*  --  7.7*  MG  --  1.7  --    Liver Function Tests:  Recent Labs Lab  09/21/14 0745 09/22/14 0357  AST 63* 44*  ALT 20 14*  ALKPHOS 181* 149*  BILITOT 0.8 0.7  PROT 6.8 6.0*  ALBUMIN 2.6* 2.1*    Recent Labs Lab 09/21/14 0745  LIPASE 16*    Recent Labs Lab 09/21/14 1053  AMMONIA 18   CBC:  Recent Labs Lab 09/21/14 0745 09/21/14 1658 09/22/14 0357  WBC 28.4* 19.6* 20.9*  NEUTROABS 20.2*  --   --   HGB 9.0* 7.5* 7.9*  HCT 31.4* 26.5* 27.9*  MCV 78.9 80.1 79.3  PLT 845* 552* 577*   Cardiac Enzymes: No results for input(s): CKTOTAL, CKMB, CKMBINDEX, TROPONINI in the last 168 hours. BNP: BNP (last 3 results) No results for input(s): BNP in the last 8760 hours.  ProBNP (last 3 results) No results for input(s): PROBNP in the last 8760 hours.  CBG: No results for input(s): GLUCAP in the last 168 hours.     SignedKelvin Cellar  [facility] 09/22/2014, 2:48 PM

## 2014-09-22 NOTE — Progress Notes (Signed)
Pt's K+ 3.1 today.  Notified DR. Coralyn Pear.  Sherlin Sonier,RN.

## 2014-09-22 NOTE — Care Management Note (Signed)
Case Management Note  Patient Details  Name: [name] MRN: [id number] Date of Birth: 1965/11/11  Subjective/Objective:   Liver cirrhosis                Action/Plan: Home  Expected Discharge Date:  09/22/2014               Expected Discharge Plan:  Home/Self Care  In-House Referral:     Discharge planning Services  CM Consult, [facility], [facility]   Status of Service:  Completed, signed off  Medicare Important Message Given:    Date Medicare IM Given:    Medicare IM give by:    Date Additional Medicare IM Given:    Additional Medicare Important Message give by:     If discussed at [place] of Stay Meetings, dates discussed:    Additional Comments: Consulted for medication assistance and PCP. NCM spoke to pt and he is currently unemployed with no finances. He does not have a PCP. NCM contacted [facility] to arrange appt and they did not have any follow up appts. Asked to call back on Monday, 09/28/2014 to arrange appt for hospital follow up, provided pt with [facility] brochure and with instruction to call Monday to arrange follow up hospital appt. Provided pt with [place] letter, waiving copay cost. Explained to pt that program can only be used once per year.  Erenest Rasher, RN 09/22/2014, 4:33 PM

## 2014-09-22 NOTE — Progress Notes (Signed)
All d/c instructions explained and given to pt. Verbalized understanding. D/c off floor via ambulatory.  Refused w/c.  Lestine Rahe,RN.

## 2014-09-23 LAB — HIV ANTIBODY (ROUTINE TESTING W REFLEX): HIV SCREEN 4TH GENERATION: NONREACTIVE

## 2014-09-23 LAB — ANTI-SMOOTH MUSCLE ANTIBODY, IGG: F-Actin IgG: 16 Units (ref 0–19)

## 2014-09-23 LAB — AFP TUMOR MARKER: AFP-Tumor Marker: 2.2 ng/mL (ref 0.0–8.3)

## 2014-09-23 LAB — HEPATITIS B CORE ANTIBODY, TOTAL: Hep B Core Total Ab: NEGATIVE

## 2014-09-23 LAB — HCV COMMENT:

## 2014-09-23 LAB — HEPATITIS B E ANTIBODY: Hep B E Ab: NEGATIVE

## 2014-09-23 LAB — HEPATITIS B SURFACE ANTIGEN: HEP B S AG: NEGATIVE

## 2014-09-23 LAB — HEPATITIS B SURFACE ANTIBODY,QUALITATIVE: Hep B S Ab: NONREACTIVE

## 2014-09-23 LAB — HEPATITIS B CORE ANTIBODY, IGM: Hep B C IgM: NEGATIVE

## 2014-09-23 LAB — HEPATITIS B E ANTIGEN: HEP B E AG: NEGATIVE

## 2014-09-23 LAB — HEPATITIS C ANTIBODY (REFLEX): HCV AB: 0.1 {s_co_ratio} (ref 0.0–0.9)

## 2014-09-23 LAB — MITOCHONDRIAL ANTIBODIES: Mitochondrial M2 Ab, IgG: 11.8 Units (ref 0.0–20.0)

## 2014-09-25 ENCOUNTER — Encounter: Payer: Self-pay | Admitting: Gastroenterology

## 2014-09-26 LAB — CULTURE, BODY FLUID-BOTTLE: Culture: NO GROWTH

## 2014-09-26 LAB — CULTURE, BODY FLUID W GRAM STAIN -BOTTLE

## 2014-10-01 ENCOUNTER — Ambulatory Visit: Payer: Self-pay | Admitting: Nurse Practitioner

## 2014-10-20 ENCOUNTER — Emergency Department (HOSPITAL_COMMUNITY)
Admission: EM | Admit: 2014-10-20 | Discharge: 2014-10-20 | Disposition: A | Discharge status: Home / Self Care | Payer: Self-pay | Attending: Emergency Medicine | Admitting: Emergency Medicine

## 2014-10-20 ENCOUNTER — Encounter (HOSPITAL_COMMUNITY): Payer: Self-pay | Admitting: *Deleted

## 2014-10-20 ENCOUNTER — Emergency Department (HOSPITAL_COMMUNITY): Payer: Self-pay

## 2014-10-20 DIAGNOSIS — Z792 Long term (current) use of antibiotics: Secondary | ICD-10-CM | POA: Insufficient documentation

## 2014-10-20 DIAGNOSIS — R634 Abnormal weight loss: Secondary | ICD-10-CM | POA: Insufficient documentation

## 2014-10-20 DIAGNOSIS — Z720 Tobacco use: Secondary | ICD-10-CM | POA: Insufficient documentation

## 2014-10-20 DIAGNOSIS — Z8639 Personal history of other endocrine, nutritional and metabolic disease: Secondary | ICD-10-CM | POA: Insufficient documentation

## 2014-10-20 DIAGNOSIS — R188 Other ascites: Secondary | ICD-10-CM | POA: Insufficient documentation

## 2014-10-20 DIAGNOSIS — Z79899 Other long term (current) drug therapy: Secondary | ICD-10-CM | POA: Insufficient documentation

## 2014-10-20 DIAGNOSIS — Z8719 Personal history of other diseases of the digestive system: Secondary | ICD-10-CM | POA: Insufficient documentation

## 2014-10-20 LAB — COMPREHENSIVE METABOLIC PANEL
ALT: 20 U/L (ref 17–63)
AST: 67 U/L — ABNORMAL HIGH (ref 15–41)
Albumin: 2.8 g/dL — ABNORMAL LOW (ref 3.5–5.0)
Alkaline Phosphatase: 105 U/L (ref 38–126)
Anion gap: 9 (ref 5–15)
BUN: <5 mg/dL — ABNORMAL LOW (ref 6–20)
CHLORIDE: 102 mmol/L (ref 101–111)
CO2: 26 mmol/L (ref 22–32)
Calcium: 9 mg/dL (ref 8.9–10.3)
Creatinine, Ser: 0.69 mg/dL (ref 0.61–1.24)
GFR calc Af Amer: >60 mL/min (ref >60)
Glucose, Bld: 122 mg/dL — ABNORMAL HIGH (ref 65–99)
Potassium: 4.7 mmol/L (ref 3.5–5.1)
Sodium: 137 mmol/L (ref 135–145)
Total Bilirubin: 0.7 mg/dL (ref 0.3–1.2)
Total Protein: 7.3 g/dL (ref 6.5–8.1)

## 2014-10-20 LAB — URINALYSIS, ROUTINE W REFLEX MICROSCOPIC
GLUCOSE, UA: NEGATIVE mg/dL
Hgb urine dipstick: NEGATIVE
Ketones, ur: 15 mg/dL — AB
NITRITE: POSITIVE — AB
PH: 5.5 (ref 5.0–8.0)
Protein, ur: 30 mg/dL — AB
Specific Gravity, Urine: 1.03 (ref 1.005–1.030)
Urobilinogen, UA: 1 mg/dL (ref 0.0–1.0)

## 2014-10-20 LAB — CBC
HCT: 32.3 % — ABNORMAL LOW (ref 39.0–52.0)
Hemoglobin: 9.2 g/dL — ABNORMAL LOW (ref 13.0–17.0)
MCH: 21.1 pg — AB (ref 26.0–34.0)
MCHC: 28.5 g/dL — ABNORMAL LOW (ref 30.0–36.0)
MCV: 74.3 fL — ABNORMAL LOW (ref 78.0–100.0)
Platelets: 570 10*3/uL — ABNORMAL HIGH (ref 150–400)
RBC: 4.35 MIL/uL (ref 4.22–5.81)
RDW: 17 % — AB (ref 11.5–15.5)
WBC: 13.6 10*3/uL — AB (ref 4.0–10.5)

## 2014-10-20 LAB — URINE MICROSCOPIC-ADD ON

## 2014-10-20 LAB — LIPASE, BLOOD: LIPASE: 16 U/L — AB (ref 22–51)

## 2014-10-20 MED ORDER — SODIUM CHLORIDE 0.9 % IV BOLUS (SEPSIS)
1000.0000 mL | Freq: Once | INTRAVENOUS | Status: AC
  Administered 2014-10-20: 1000 mL via INTRAVENOUS

## 2014-10-20 MED ORDER — IOHEXOL 300 MG/ML  SOLN
25.0000 mL | Freq: Once | INTRAMUSCULAR | Status: AC | PRN
  Administered 2014-10-20: 25 mL via ORAL

## 2014-10-20 MED ORDER — CIPROFLOXACIN HCL 500 MG PO TABS: 500.0000 mg | ORAL_TABLET | Freq: Two times a day (BID) | ORAL | Status: DC

## 2014-10-20 MED ORDER — IOHEXOL 300 MG/ML  SOLN
100.0000 mL | Freq: Once | INTRAMUSCULAR | Status: AC | PRN
  Administered 2014-10-20: 100 mL via INTRAVENOUS

## 2014-10-20 NOTE — ED Notes (Signed)
Patient undressed, in gown, on monitor, continuous pulse oximetry and blood pressure cuff; visitor at bedside 

## 2014-10-20 NOTE — Discharge Instructions (Signed)
You were evaluated in the ED today for your abdominal distention. There does not appear to be an emergent cause her symptoms at this time. You have an appointment with radiology on Friday, September 2 at 12:45 PM to have your belly drained. It is also important for you to follow-up with [facility] in order to establish primary care. Return to ED for new or worsening symptoms.  Ascites Ascites is a gathering of fluid in the belly (abdomen). This is most often caused by liver disease. It may also be caused by a number of other less common problems. It causes a ballooning out (distension) of the abdomen. CAUSES  Scarring of the liver (cirrhosis) is the most common cause of ascites. Other causes include:  Infection or inflammation in the abdomen.  Cancer in the abdomen.  Heart failure.  Certain forms of kidney failure (nephritic syndrome).  Inflammation of the pancreas.  Clots in the veins of the liver. SYMPTOMS  In the early stages of ascites, you may not have any symptoms. The main symptom of ascites is a sense of abdominal bloating. This is due to the presence of fluid. This may also cause an increase in abdominal or waist size. People with this condition can develop swelling in the legs, and men can develop a swollen scrotum. When there is a lot of fluid, it may be hard to breath. Stretching of the abdomen by fluid can be painful. DIAGNOSIS  Certain features of your medical history, such as a history of liver disease and of an enlarging abdomen, can suggest the presence of ascites. The diagnosis of ascites can be made on physical exam by your caregiver. An abdominal ultrasound examination can confirm that ascites is present, and estimate the amount of fluid. Once ascites is confirmed, it is important to determine its cause. Again, a history of one of the conditions listed in "CAUSES" provides a strong clue. A physical exam is important, and blood and X-ray tests may be needed.  During a procedure called paracentesis, a sample of fluid is removed from the abdomen. This can determine certain key features about the fluid, such as whether or not infection or cancer is present. Your caregiver will determine if a paracentesis is necessary. They will describe the procedure to you. PREVENTION  Ascites is a complication of other conditions. Therefore to prevent ascites, you must seek treatment for any significant health conditions you have. Once ascites is present, careful attention to fluid and salt intake may help prevent it from getting worse. If you have ascites, you should not drink alcohol. PROGNOSIS  The prognosis of ascites depends on the underlying disease. If the disease is reversible, such as with certain infections or with heart failure, then ascites may improve or disappear. When ascites is caused by cirrhosis, then it indicates that the liver disease has worsened, and further evaluation and treatment of the liver disease is needed. If your ascites is caused by cancer, then the success or failure of the cancer treatment will determine whether your ascites will improve or worsen. RISKS AND COMPLICATIONS  Ascites is likely to worsen if it is not properly diagnosed and treated. A large amount of ascites can cause pain and difficulty breathing. The main complication, besides worsening, is infection (called spontaneous bacterial peritonitis). This requires prompt treatment. TREATMENT  The treatment of ascites depends on its cause. When liver disease is your cause, medical management using water pills (diuretics) and decreasing salt intake is often effective. Ascites due to  peritoneal inflammation or malignancy (cancer) alone does not respond to salt restriction and diuretics. Hospitalization is sometimes required. If the treatment of ascites cannot be managed with medications, a number of other treatments are available. Your caregivers will help you decide which will work best for  you. Some of these are:  Removal of fluid from the abdomen (paracentesis).  Fluid from the abdomen is passed into a vein (peritoneovenous shunting).  Liver transplantation.  Transjugular intrahepatic portosystemic stent shunt. HOME CARE INSTRUCTIONS  It is important to monitor body weight and the intake and output of fluids. Weigh yourself at the same time every day. Record your weights. Fluid restriction may be necessary. It is also important to know your salt intake. The more salt you take in, the more fluid you will retain. Ninety percent of people with ascites respond to this approach.  Follow any directions for medicines carefully.  Follow up with your caregiver, as directed.  Report any changes in your health, especially any new or worsening symptoms.  If your ascites is from liver disease, avoid alcohol and other substances toxic to the liver. SEEK MEDICAL CARE IF:   Your weight increases more than a few pounds in a few days.  Your abdominal or waist size increases.  You develop swelling in your legs.  You had swelling and it worsens. SEEK IMMEDIATE MEDICAL CARE IF:   You develop a fever.  You develop new abdominal pain.  You develop difficulty breathing.  You develop confusion.  You have bleeding from the mouth, stomach, or rectum. MAKE SURE YOU:   Understand these instructions.  Will watch your condition.  Will get help right away if you are not doing well or get worse. Document Released: 02/13/2005 Document Revised: 05/08/2011 Document Reviewed: 09/14/2006 [facility] Patient Information 2015 [place], [place]. This information is not intended to replace advice given to you by your health care provider. Make sure you discuss any questions you have with your health care provider.

## 2014-10-20 NOTE — ED Notes (Signed)
PA at the bedside.

## 2014-10-20 NOTE — ED Notes (Signed)
Patient transported to X-ray 

## 2014-10-20 NOTE — ED Notes (Signed)
Patient already aware of need of urine specimen; urinal handed to patient for patient to attempt an urine sample

## 2014-10-20 NOTE — ED Notes (Signed)
Pt reports abdominal "heaviness". Pt states that he was here last month for ascites and paracentesis. Reports weight loss as well.

## 2014-10-20 NOTE — ED Notes (Signed)
Patient can not get an urine sample at this time will try later

## 2014-10-20 NOTE — ED Notes (Signed)
Patient resting watching television with visitor at bedside, no needs at this time

## 2014-10-20 NOTE — ED Provider Notes (Signed)
CSN: [id number]     Arrival date & time 10/20/14  7416 History   First MD Initiated Contact with Patient 10/20/14 [phone]     Chief Complaint  Patient presents with  . Abdominal Pain     (Consider location/radiation/quality/duration/timing/severity/associated sxs/prior Treatment) HPI [name] is a 49 y.o. male history of cirrhosis, alcoholism, comes in for evaluation of abdominal distention. Patient states he was seen in the ED approximately one month ago with similar symptoms and was told he had fluid in his stomach. He reports today that he has returned because the fluid has increased. He denies any abdominal pain, fevers, chills, nausea or vomiting, back pain. He does report cough in the mornings and at night but not during the day. He reports associated weight loss of approximately 15 pounds over the past 3 months. Denies any chest pain, shortness of breath, constipation, diarrhea, numbness or weakness.  Past Medical History  Diagnosis Date  . Alcoholism   . Hyperlipemia   . Cirrhosis    Past Surgical History  Procedure Laterality Date  . No past surgeries     Family History  Problem Relation Age of Onset  . Coronary artery disease    . Diabetes type II     Social History  Substance Use Topics  . Smoking status: Current Every Day Smoker -- 0.50 packs/day    Types: Cigarettes  . Smokeless tobacco: Never Used  . Alcohol Use: Yes     Comment: pt states last drink was 3 days ago    Review of Systems A 10 point review of systems was completed and was negative except for pertinent positives and negatives as mentioned in the history of present illness     Allergies  Review of patient's allergies indicates no known allergies.  Home Medications   Prior to Admission medications   Medication Sig Start Date End Date Taking? Authorizing Provider  ciprofloxacin (CIPRO) 500 MG tablet Take 1 tablet (500 mg total) by mouth 2 (two) times daily. 10/20/14   Comer Locket,  PA-C  citalopram (CELEXA) 40 MG tablet Take 1 tablet (40 mg total) by mouth daily. Start next mo after completing 20mg  course Patient not taking: Reported on 09/21/2014 01/12/12   Shawnee Knapp, MD  furosemide (LASIX) 20 MG tablet Take 1 tablet (20 mg total) by mouth daily. 09/22/14   Kelvin Cellar, MD  sildenafil (VIAGRA) 50 MG tablet Take 1 tablet (50 mg total) by mouth as needed for erectile dysfunction. Patient not taking: Reported on 09/21/2014 12/20/11   Shawnee Knapp, MD  spironolactone (ALDACTONE) 50 MG tablet Take 1 tablet (50 mg total) by mouth daily. 09/23/14   Kelvin Cellar, MD  traZODone (DESYREL) 50 MG tablet Take 1 tablet (50 mg total) by mouth at bedtime as needed for sleep. 09/22/14   Kelvin Cellar, MD   BP 119/81 mmHg  Pulse 98  Temp(Src) 98 F (36.7 C) (Oral)  Resp 15  Ht 5\' 6"  (1.676 m)  Wt 127 lb (57.607 kg)  BMI 20.51 kg/m2  SpO2 100% Physical Exam  Constitutional: He is oriented to person, place, and time. He appears well-developed and well-nourished.  HENT:  Head: Normocephalic and atraumatic.  Mouth/Throat: Oropharynx is clear and moist.  Eyes: Conjunctivae are normal. Pupils are equal, round, and reactive to light. Right eye exhibits no discharge. Left eye exhibits no discharge. No scleral icterus.  Neck: Neck supple.  Cardiovascular: Normal rate, regular rhythm and normal heart sounds.   Pulmonary/Chest: Effort normal and  breath sounds normal. No respiratory distress. He has no wheezes. He has no rales.  Abdominal: Soft. There is no tenderness.  Obvious abdominal distention. Fluid wave present. No abdominal tenderness.  Musculoskeletal: He exhibits no tenderness.  Neurological: He is alert and oriented to person, place, and time.  Cranial Nerves II-XII grossly intact  Skin: Skin is warm and dry. No rash noted.  Patient's skin appears mildly jaundiced  Psychiatric: He has a normal mood and affect.  Nursing note and vitals reviewed.   ED Course  Procedures  (including critical care time) Labs Review Labs Reviewed  LIPASE, BLOOD - Abnormal; Notable for the following:    Lipase 16 (*)    All other components within normal limits  COMPREHENSIVE METABOLIC PANEL - Abnormal; Notable for the following:    Glucose, Bld 122 (*)    BUN <5 (*)    Albumin 2.8 (*)    AST 67 (*)    All other components within normal limits  CBC - Abnormal; Notable for the following:    WBC 13.6 (*)    Hemoglobin 9.2 (*)    HCT 32.3 (*)    MCV 74.3 (*)    MCH 21.1 (*)    MCHC 28.5 (*)    RDW 17.0 (*)    Platelets 570 (*)    All other components within normal limits  URINALYSIS, ROUTINE W REFLEX MICROSCOPIC (NOT AT [facility]) - Abnormal; Notable for the following:    Color, Urine ORANGE (*)    APPearance HAZY (*)    Bilirubin Urine SMALL (*)    Ketones, ur 15 (*)    Protein, ur 30 (*)    Nitrite POSITIVE (*)    Leukocytes, UA SMALL (*)    All other components within normal limits  URINE MICROSCOPIC-ADD ON - Abnormal; Notable for the following:    Casts HYALINE CASTS (*)    All other components within normal limits    Imaging Review Ct Abdomen Pelvis W Contrast  10/20/2014   CLINICAL DATA:  49 year old with abdominal heaviness and bloating. History of ascites and paracentesis.  EXAM: CT ABDOMEN AND PELVIS WITH CONTRAST  TECHNIQUE: Multidetector CT imaging of the abdomen and pelvis was performed using the standard protocol following bolus administration of intravenous contrast.  CONTRAST:  186mL OMNIPAQUE IOHEXOL 300 MG/ML  SOLN  COMPARISON:  None.  FINDINGS: There is a new left pleural effusion which is small-to-moderate in size and there is associated compressive atelectasis. Negative for free intraperitoneal air. There is a moderate amount of low-density ascites throughout the abdomen and pelvis. The amount of ascites is similar to the prior examination.  Again noted is a prominent left hepatic lobe. Again noted is punctate low-density area in the left hepatic lobe on  sequence 2, image 26 which is nonspecific. Liver parenchyma is mildly heterogeneous. No significant nodularity. Fluid around the gallbladder. No gross abnormality to the spleen or pancreas. The portal venous system is patent. No acute abnormality of the kidneys. Small low-density structures in both kidneys are too small to definitively characterize.  Atherosclerotic calcifications involving the abdominal aorta without aneurysm. Atherosclerotic plaque at the origin of the right renal artery.  No acute abnormality in the prostate or seminal vesicles. Urinary bladder is completely decompressed. No acute abnormality to the small bowel, appendix or colon. Few prominent lymph nodes in the upper abdomen are unchanged.  No acute bone abnormality.  IMPRESSION: New left pleural effusion with compressive atelectasis.  Moderate amount of abdominal ascites is similar  to the prior examination.  Left hepatic lobe is enlarged and compatible with history of cirrhosis.  Punctate low-density structure in the left hepatic lobe is too small to definitively characterize. There are also small low-density structures in both kidneys.   Electronically Signed   By: Markus Daft M.D.   On: 10/20/2014 11:18   I have personally reviewed and evaluated these images and lab results as part of my medical decision-making.   EKG Interpretation None     Meds given in ED:  Medications  iohexol (OMNIPAQUE) 300 MG/ML solution 25 mL (25 mLs Oral Contrast Given 10/20/14 0958)  iohexol (OMNIPAQUE) 300 MG/ML solution 100 mL (100 mLs Intravenous Contrast Given 10/20/14 1037)  sodium chloride 0.9 % bolus 1,000 mL (0 mLs Intravenous Stopped 10/20/14 1238)    New Prescriptions   CIPROFLOXACIN (CIPRO) 500 MG TABLET    Take 1 tablet (500 mg total) by mouth 2 (two) times daily.   Filed Vitals:   10/20/14 1117 10/20/14 1145 10/20/14 1200 10/20/14 1230  BP: 116/85 117/80 114/80 119/81  Pulse: 103 95 97 98  Temp:      TempSrc:      Resp: 18 14 15 15    Height:      Weight:      SpO2: 100% 99% 99% 100%     MDM  Vitals stable - WNL -afebrile, after receiving1 L of normal saline fluids, patient's heart rate came down to 94 bpm. Pt resting comfortably in ED. PE--obvious moderate ascites. No abdominal tenderness. Physical exam otherwise benign. Labwork.--evidence of UTI on urinalysis. Labs otherwise noncontributory. Imaging--CT abdomen shows ascites consistent with previous exam, no new intra-abdominal pathology.  DDX--patient here for evaluation of abdominal swelling. No fevers or abdominal pain. Low suspicion for SBP. Ascites likely secondary to cirrhotic liver. No evidence of other acute or emergent intra-abdominal pathology at this time. Will hold prescription for Lasix at this time due to patient's heart rate response to IV fluids and likelihood of peripheral dehydration. Will DC with referral to IR for therapeutic paracentesis. Called IR, patient has outpatient appointment for paracentesis on Friday, September 2 at 1:00 PM. Also given [facility] in order to establish primary care. I discussed all relevant lab findings and imaging results with pt and they verbalized understanding. Discussed f/u with PCP within 48 hrs and return precautions, pt very amenable to plan. Prior to patient discharge, I discussed and reviewed this case with Dr.Pfeiffer   Final diagnoses:  Ascites      Comer Locket, PA-C 10/20/14 [place], MD 11/04/14 [phone]

## 2014-10-23 ENCOUNTER — Encounter: Payer: Self-pay | Admitting: Family Medicine

## 2014-10-23 ENCOUNTER — Ambulatory Visit: Payer: Self-pay | Attending: Family Medicine | Admitting: Family Medicine

## 2014-10-23 VITALS — BP 110/80 | HR 108 | Temp 98.0°F | Resp 16 | Ht 66.0 in | Wt 127.6 lb

## 2014-10-23 DIAGNOSIS — R634 Abnormal weight loss: Secondary | ICD-10-CM | POA: Insufficient documentation

## 2014-10-23 DIAGNOSIS — G4700 Insomnia, unspecified: Secondary | ICD-10-CM

## 2014-10-23 DIAGNOSIS — K7031 Alcoholic cirrhosis of liver with ascites: Secondary | ICD-10-CM

## 2014-10-23 DIAGNOSIS — Z720 Tobacco use: Secondary | ICD-10-CM

## 2014-10-23 DIAGNOSIS — R188 Other ascites: Secondary | ICD-10-CM

## 2014-10-23 LAB — BASIC METABOLIC PANEL
BUN: 6 mg/dL — ABNORMAL LOW (ref 7–25)
CHLORIDE: 102 mmol/L (ref 98–110)
CO2: 25 mmol/L (ref 20–31)
Calcium: 8.5 mg/dL — ABNORMAL LOW (ref 8.6–10.3)
Creat: 0.53 mg/dL — ABNORMAL LOW (ref 0.70–1.33)
Glucose, Bld: 95 mg/dL (ref 65–99)
POTASSIUM: 4 mmol/L (ref 3.5–5.3)
Sodium: 139 mmol/L (ref 135–146)

## 2014-10-23 LAB — TSH: TSH: 3.709 u[IU]/mL (ref 0.350–4.500)

## 2014-10-23 MED ORDER — TRAZODONE HCL 100 MG PO TABS: 100.0000 mg | ORAL_TABLET | Freq: Every evening | ORAL | Status: DC | PRN

## 2014-10-23 MED ORDER — SPIRONOLACTONE 50 MG PO TABS: 50.0000 mg | ORAL_TABLET | Freq: Every day | ORAL | Status: DC

## 2014-10-23 NOTE — Progress Notes (Signed)
Subjective:    Patient ID: [name], male    DOB: 09/24/1965, 50 y.o.   MRN: [id number]  HPI [name] is a 49 year old  Male with a history of Alcoholism and liver Cirrhosis  Who comes in to establish care. He was recently seen at the ED on 10/20/14 for abdominal pain and ascites where he was diagnosed with a  UTI and placed on Ciprofloxacin and also scheduled for an outpatient abdominal paracentesis by IR for 10/30/14. Prior to this ED presentation he was hospitalized from 09/21/14- 09/22/14 at [facility], [place] where he diagnosed with liver cirrhosis and was placed on Spironolactone and Lasix and scheduled to see [facility] on 10/01/14. He underwent ultrasound guided abdominal paracentesis with removal of 3.2L of ascitic fluid and he was also treated for spontaneous bacteria peritonitis with IV Ceftriaxone; fluid cytology was negative for malignancy. Hepatitis panel, AFP, HIV anti mitochondral antibodies came back negative.  Today he complains of abdominal pain, mild dyspnea; he also has insomnia and wakes up several times at night. Admits to loosing 30lbs in the last 3 months, no recent TSH on file. He missed his appointment with GI.   Past Medical History  Diagnosis Date  . Alcoholism   . Hyperlipemia   . Cirrhosis     Past Surgical History  Procedure Laterality Date  . No past surgeries      Social History   Social History  . Marital Status: Legally Separated    Spouse Name: N/A  . Number of Children: N/A  . Years of Education: N/A   Occupational History  . Not on file.   Social History Main Topics  . Smoking status: Current Every Day Smoker -- 0.50 packs/day    Types: Cigarettes  . Smokeless tobacco: Never Used  . Alcohol Use: Yes     Comment: pt states last drink was 3 days ago  . Drug Use: No  . Sexual Activity: No   Other Topics Concern  . Not on file   Social History Narrative    No Known Allergies  Current Outpatient Prescriptions on  File Prior to Visit  Medication Sig Dispense Refill  . ciprofloxacin (CIPRO) 500 MG tablet Take 1 tablet (500 mg total) by mouth 2 (two) times daily. 20 tablet 0  . citalopram (CELEXA) 40 MG tablet Take 1 tablet (40 mg total) by mouth daily. Start next mo after completing 20mg  course (Patient not taking: Reported on 09/21/2014) 30 tablet 1  . furosemide (LASIX) 20 MG tablet Take 1 tablet (20 mg total) by mouth daily. 30 tablet 1  . sildenafil (VIAGRA) 50 MG tablet Take 1 tablet (50 mg total) by mouth as needed for erectile dysfunction. (Patient not taking: Reported on 09/21/2014) 5 tablet 0   No current facility-administered medications on file prior to visit.     Review of Systems  Constitutional: Positive for unexpected weight change. Negative for activity change and appetite change.  HENT: Negative for sinus pressure and sore throat.   Eyes: Negative for visual disturbance.  Respiratory: Positive for shortness of breath. Negative for cough and chest tightness.   Cardiovascular: Negative for chest pain and leg swelling.  Gastrointestinal: Positive for abdominal distention. Negative for abdominal pain, diarrhea and constipation.  Endocrine: Negative.   Genitourinary: Negative for dysuria.  Musculoskeletal: Negative for myalgias and joint swelling.  Skin: Negative for rash.  Allergic/Immunologic: Negative.   Neurological: Negative for weakness, light-headedness and numbness.  Psychiatric/Behavioral: Positive for sleep disturbance.  Negative for suicidal ideas and dysphoric mood.         Objective:  Filed Vitals:   10/23/14 1449  BP: 110/80  Pulse: 108  Temp: 98 F (36.7 C)  Resp: 16  Height: 5\' 6"  (1.676 m)  Weight: 127 lb 9.6 oz (57.879 kg)  SpO2: 100%      Physical Exam  Constitutional: He is oriented to person, place, and time.  Thin appearing  HENT:  Head: Normocephalic and atraumatic.  Right Ear: External ear normal.  Left Ear: External ear normal.  Eyes:  Conjunctivae and EOM are normal. Pupils are equal, round, and reactive to light.  Neck: Normal range of motion. Neck supple. No tracheal deviation present.  Cardiovascular: Regular rhythm and normal heart sounds.  Tachycardia present.   No murmur heard. Pulmonary/Chest: Effort normal and breath sounds normal. No respiratory distress. He has no wheezes. He exhibits no tenderness.  Abdominal: Soft. Bowel sounds are normal. He exhibits distension (abdominal girth measures 36.5 inches; ascites present). He exhibits no mass. There is no tenderness.  Musculoskeletal: Normal range of motion. He exhibits no edema or tenderness.  Neurological: He is alert and oriented to person, place, and time.  Skin: Skin is warm and dry.  Psychiatric: He has a normal mood and affect.        CMP Latest Ref Rng 10/20/2014 09/22/2014 09/21/2014  Glucose 65 - 99 mg/dL 122(H) 74 -  BUN 6 - 20 mg/dL <5(L) <5(L) -  Creatinine 0.61 - 1.24 mg/dL 0.69 0.51(L) 0.61  Sodium 135 - 145 mmol/L 137 135 -  Potassium 3.5 - 5.1 mmol/L 4.7 3.1(L) -  Chloride 101 - 111 mmol/L 102 98(L) -  CO2 22 - 32 mmol/L 26 27 -  Calcium 8.9 - 10.3 mg/dL 9.0 7.7(L) -  Total Protein 6.5 - 8.1 g/dL 7.3 6.0(L) -  Total Bilirubin 0.3 - 1.2 mg/dL 0.7 0.7 -  Alkaline Phos 38 - 126 U/L 105 149(H) -  AST 15 - 41 U/L 67(H) 44(H) -  ALT 17 - 63 U/L 20 14(L) -         Assessment & Plan:   Alcoholic liver cirrhosis with ascites: Basic metabolic panel sent off. Refilled spironolactone, continue Lasix. Ammonia level sent off. Referred to GI Scheduled for abdominal paracentesis on 10/30/14  Weight loss: Has lost about 30 pounds in the last 3 months Unknown etiology. TSH sent off. Malignancy is not unlikely and so I have counseled him on smoking cessation.  Tobacco abuse: Spent 5 minutes discussing cessation with him. He currently smokes 1.5 packs a day and will be working on cutting back.  Insomnia: Increased dose of trazodone from 50-100  mg.  This note has been created with Surveyor, quantity. Any transcriptional errors are unintentional.

## 2014-10-23 NOTE — Patient Instructions (Signed)
Cirrhosis  Cirrhosis is a condition of scarring of the liver which is caused when the liver has tried repairing itself following damage. This damage may come from a previous infection such as one of the forms of hepatitis (usually hepatitis C), or the damage may come from being injured by toxins. The main toxin that causes this damage is alcohol. The scarring of the liver from use of alcohol is irreversible. That means the liver cannot return to normal even though alcohol is not used any more. The main danger of hepatitis C infection is that it may cause long-lasting (chronic) liver disease, and this also may lead to cirrhosis. This complication is progressive and irreversible.  CAUSES   Prior to available blood tests, hepatitis C could be contracted by blood transfusions. Since testing of blood has improved, this is now unlikely. This infection can also be contracted through intravenous drug use and the sharing of needles. It can also be contracted through sexual relationships. The injury caused by alcohol comes from too much use. It is not a few drinks that poison the liver, but years of misuse. Usually there will be some signs and symptoms early with scarring of the liver that suggest the development of better habits. Alcohol should never be used while using acetaminophen. A small dose of both taken together may cause irreversible damage to the liver.  HOME CARE INSTRUCTIONS   There is no specific treatment for cirrhosis. However, there are things you can do to avoid making the condition worse.  · Rest as needed.  · Eat a well-balanced diet. Your caregiver can help you with suggestions.  · Vitamin supplements including vitamins A, K, D, and thiamine can help.  · A low-salt diet, water restriction, or diuretic medicine may be needed to reduce fluid retention.  · Avoid alcohol. This can be extremely toxic if combined with acetaminophen.  · Avoid drugs which are toxic to the liver. Some of these include isoniazid,  methyldopa, acetaminophen, anabolic steroids (muscle-building drugs), erythromycin, and oral contraceptives (birth control pills). Check with your caregiver to make sure medicines you are presently taking will not be harmful.  · Periodic blood tests may be required. Follow your caregiver's advice regarding the timing of these.  · Milk thistle is an herbal remedy which does protect the liver against toxins. However, it will not help once the liver has been scarred.  SEEK MEDICAL CARE IF:  · You have increasing fatigue or weakness.  · You develop swelling of the hands, feet, legs, or face.  · You vomit bright red blood, or a coffee ground appearing material.  · You have blood in your stools, or the stools turn black and tarry.  · You have a fever.  · You develop loss of appetite, or have nausea and vomiting.  · You develop jaundice.  · You develop easy bruising or bleeding.  · You have worsening of any of the problems you are concerned about.  Document Released: 02/13/2005 Document Revised: 05/08/2011 Document Reviewed: 10/02/2007  Exit[employer]® Patient Information ©2015 [employer]. This information is not intended to replace advice given to you by your health care provider. Make sure you discuss any questions you have with your health care provider.

## 2014-10-23 NOTE — Progress Notes (Signed)
Patient here for follow up on his ascites Patient feels as though his abd is more distended and does Not have an appt. Until next Friday to have fluid removed

## 2014-10-24 LAB — AMMONIA: AMMONIA: 53 umol/L (ref 16–53)

## 2014-10-25 LAB — HEMOGLOBIN A1C
HEMOGLOBIN A1C: 5.1 % (ref <5.7)
MEAN PLASMA GLUCOSE: 100 mg/dL (ref <117)

## 2014-10-27 ENCOUNTER — Telehealth: Payer: Self-pay | Admitting: Family Medicine

## 2014-10-27 NOTE — Telephone Encounter (Signed)
-----   Message from Arnoldo Morale, MD sent at 10/26/2014  9:01 PM EDT ----- Please inform the patient that labs are normal. Thank you.

## 2014-10-27 NOTE — Telephone Encounter (Signed)
Verified name and date of birth and explained to patient that his lab results from his blood work were normal.  Patient states he is still having trouble sleeping at night and that he does not feel the Trazodone is helping.  I is asking if he can take one and a half tablet instead of one tablet at bedtime.  RN routing message to Dr. Jarold Song for clarification and will call the patient back tomorrow.  Patient appreciative of call and assistance.

## 2014-10-27 NOTE — Telephone Encounter (Signed)
Patient called to request blood work results, please f/u with pt. °

## 2014-10-28 ENCOUNTER — Other Ambulatory Visit: Payer: Self-pay | Admitting: Family Medicine

## 2014-10-28 NOTE — Progress Notes (Signed)
I had raised his dose from 50 to 100mg  just 5 days ago; I will need him to undergo his paracentesis first after which he would assess his insomnia for improvement. Any dose increase should be addressed at his next office visit.

## 2014-10-28 NOTE — Telephone Encounter (Signed)
Per Dr Jarold Song patient is to keep paracentesis appointment and see if that helps with the quality of sleep.  If not patient is to make appointment to see her for any medical adjustments.  Patient verbalized understanding and was transferred to front for appointment.

## 2014-10-30 ENCOUNTER — Ambulatory Visit (HOSPITAL_COMMUNITY)
Admission: RE | Admit: 2014-10-30 | Discharge: 2014-10-30 | Disposition: A | Discharge status: Home / Self Care | Payer: MEDICAID | Source: Ambulatory Visit | Attending: Emergency Medicine | Admitting: Emergency Medicine

## 2014-10-30 ENCOUNTER — Other Ambulatory Visit (HOSPITAL_COMMUNITY): Payer: Self-pay | Admitting: Emergency Medicine

## 2014-10-30 DIAGNOSIS — R188 Other ascites: Secondary | ICD-10-CM

## 2014-10-30 MED ORDER — LIDOCAINE HCL (PF) 1 % IJ SOLN
INTRAMUSCULAR | Status: AC
  Filled 2014-10-30: qty 10

## 2014-11-16 ENCOUNTER — Telehealth: Payer: Self-pay | Admitting: Family Medicine

## 2014-11-16 NOTE — Telephone Encounter (Signed)
Patient called for med refill on traZODone (DESYREL) 100 MG tablet. Please f/u

## 2014-11-26 ENCOUNTER — Ambulatory Visit: Payer: Self-pay

## 2014-11-26 ENCOUNTER — Encounter: Payer: Self-pay | Admitting: Family Medicine

## 2014-11-26 ENCOUNTER — Ambulatory Visit: Payer: Self-pay | Attending: Family Medicine | Admitting: Family Medicine

## 2014-11-26 VITALS — BP 110/76 | HR 94 | Temp 97.9°F | Resp 16 | Ht 66.0 in | Wt 128.0 lb

## 2014-11-26 DIAGNOSIS — F17200 Nicotine dependence, unspecified, uncomplicated: Secondary | ICD-10-CM

## 2014-11-26 DIAGNOSIS — D509 Iron deficiency anemia, unspecified: Secondary | ICD-10-CM

## 2014-11-26 DIAGNOSIS — M898X9 Other specified disorders of bone, unspecified site: Secondary | ICD-10-CM

## 2014-11-26 DIAGNOSIS — Z720 Tobacco use: Secondary | ICD-10-CM

## 2014-11-26 DIAGNOSIS — G4700 Insomnia, unspecified: Secondary | ICD-10-CM

## 2014-11-26 DIAGNOSIS — M899 Disorder of bone, unspecified: Secondary | ICD-10-CM

## 2014-11-26 DIAGNOSIS — F5104 Psychophysiologic insomnia: Secondary | ICD-10-CM | POA: Insufficient documentation

## 2014-11-26 LAB — COMPLETE METABOLIC PANEL WITH GFR
ALT: 9 U/L (ref 9–46)
AST: 15 U/L (ref 10–35)
Albumin: 4.3 g/dL (ref 3.6–5.1)
Alkaline Phosphatase: 71 U/L (ref 40–115)
BUN: 11 mg/dL (ref 7–25)
CHLORIDE: 100 mmol/L (ref 98–110)
CO2: 26 mmol/L (ref 20–31)
CREATININE: 0.62 mg/dL — AB (ref 0.70–1.33)
Calcium: 9.6 mg/dL (ref 8.6–10.3)
GFR, Est African American: >89 mL/min (ref >=60)
GFR, Est Non African American: >89 mL/min (ref >=60)
GLUCOSE: 98 mg/dL (ref 65–99)
Potassium: 4.8 mmol/L (ref 3.5–5.3)
SODIUM: 136 mmol/L (ref 135–146)
TOTAL PROTEIN: 8 g/dL (ref 6.1–8.1)
Total Bilirubin: 0.4 mg/dL (ref 0.2–1.2)

## 2014-11-26 LAB — CBC WITH DIFFERENTIAL/PLATELET
BASOS ABS: 0 10*3/uL (ref 0.0–0.1)
Basophils Relative: 0 % (ref 0–1)
EOS PCT: 4 % (ref 0–5)
Eosinophils Absolute: 0.5 10*3/uL (ref 0.0–0.7)
HEMATOCRIT: 30.8 % — AB (ref 39.0–52.0)
Hemoglobin: 9.2 g/dL — ABNORMAL LOW (ref 13.0–17.0)
LYMPHS ABS: 4.8 10*3/uL — AB (ref 0.7–4.0)
LYMPHS PCT: 36 % (ref 12–46)
MCH: 19.5 pg — AB (ref 26.0–34.0)
MCHC: 29.9 g/dL — ABNORMAL LOW (ref 30.0–36.0)
MCV: 65.4 fL — AB (ref 78.0–100.0)
MONO ABS: 1.3 10*3/uL — AB (ref 0.1–1.0)
MONOS PCT: 10 % (ref 3–12)
MPV: 8.8 fL (ref 8.6–12.4)
Neutro Abs: 6.7 10*3/uL (ref 1.7–7.7)
Neutrophils Relative %: 50 % (ref 43–77)
Platelets: 444 10*3/uL — ABNORMAL HIGH (ref 150–400)
RBC: 4.71 MIL/uL (ref 4.22–5.81)
RDW: 17.5 % — AB (ref 11.5–15.5)
WBC: 13.4 10*3/uL — ABNORMAL HIGH (ref 4.0–10.5)

## 2014-11-26 MED ORDER — ZOLPIDEM TARTRATE 5 MG PO TABS: 5.0000 mg | ORAL_TABLET | Freq: Every evening | ORAL | Status: DC | PRN

## 2014-11-26 MED ORDER — VARENICLINE TARTRATE 1 MG PO TABS: 1.0000 mg | ORAL_TABLET | Freq: Two times a day (BID) | ORAL | Status: DC

## 2014-11-26 MED ORDER — ZOLPIDEM TARTRATE 10 MG PO TABS: 10.0000 mg | ORAL_TABLET | Freq: Every evening | ORAL | Status: DC | PRN

## 2014-11-26 MED ORDER — VARENICLINE TARTRATE 0.5 MG X 11 & 1 MG X 42 PO MISC: ORAL | Status: DC

## 2014-11-26 MED ORDER — ACETAMINOPHEN-CODEINE #3 300-30 MG PO TABS: 1.0000 | ORAL_TABLET | Freq: Three times a day (TID) | ORAL | Status: DC | PRN

## 2014-11-26 NOTE — Progress Notes (Signed)
F/U Joint pain Establish Care  No Hx tobacco

## 2014-11-26 NOTE — Assessment & Plan Note (Signed)
Suspect hypokalemia since patient taking lasix without supplement K+ Treating pain Checking labs CBC with diff and CMP

## 2014-11-26 NOTE — Progress Notes (Signed)
Patient ID: [name], male   DOB: [birth year], 49 y.o.   MRN: [id number]   Subjective:  Patient ID: [name], male    DOB: 02-Jan-1966  Age: 49 y.o. MRN: [id number]  CC: Bone Pain  HPI [name] presents for   1. Pain: distal fingers, femur, legs x 4 weeks. Achy. No significant swelling. No rash or skin changes. Pain is moderate to severe. Pain interferes with ability to work. No trauma. His current medications are lasix 20 mg daily and trazodone only. No swelling in abdomen.    2.  Alcoholic cirrhosis: previously with ascites. Required diuretics and paracentesis x one. Stopped ETOH. No swelling. No abdominal pain.  3. Smoking: desires to quit. Would like to try chantix. Losing weight since quitting ETOH. No cough, CP or SOB.   4. Insomnia: has hx of insomnia. Has been on ambien for a brief time in the past which worked well. Now on trazodone which does not work as well. Has quit ETOH.   Outpatient Prescriptions Prior to Visit  Medication Sig Dispense Refill  . furosemide (LASIX) 20 MG tablet Take 1 tablet (20 mg total) by mouth daily. 30 tablet 1  . traZODone (DESYREL) 100 MG tablet Take 1 tablet (100 mg total) by mouth at bedtime as needed for sleep. 30 tablet 1  . ciprofloxacin (CIPRO) 500 MG tablet Take 1 tablet (500 mg total) by mouth 2 (two) times daily. (Patient not taking: Reported on 11/26/2014) 20 tablet 0  . citalopram (CELEXA) 40 MG tablet Take 1 tablet (40 mg total) by mouth daily. Start next mo after completing 20mg  course (Patient not taking: Reported on 09/21/2014) 30 tablet 1  . sildenafil (VIAGRA) 50 MG tablet Take 1 tablet (50 mg total) by mouth as needed for erectile dysfunction. (Patient not taking: Reported on 09/21/2014) 5 tablet 0  . spironolactone (ALDACTONE) 50 MG tablet Take 1 tablet (50 mg total) by mouth daily. (Patient not taking: Reported on 11/26/2014) 30 tablet 1   No facility-administered medications prior to visit.    ROS Review  of Systems  Constitutional: Negative for fever, chills, fatigue and unexpected weight change.  Eyes: Negative for visual disturbance.  Respiratory: Negative for cough and shortness of breath.   Cardiovascular: Negative for chest pain, palpitations and leg swelling.  Gastrointestinal: Negative for nausea, vomiting, abdominal pain, diarrhea, constipation and blood in stool.  Endocrine: Negative for polydipsia, polyphagia and polyuria.  Musculoskeletal: Positive for arthralgias. Negative for myalgias, back pain, joint swelling, gait problem and neck pain.  Skin: Negative for rash.  Allergic/Immunologic: Negative for immunocompromised state.  Hematological: Negative for adenopathy. Does not bruise/bleed easily.  Psychiatric/Behavioral: Positive for sleep disturbance. Negative for suicidal ideas and dysphoric mood. The patient is not nervous/anxious.   GAD-7: score of 10. 1-1,2, 5,6. 2-3,4,7.   Objective:  BP 110/76 mmHg  Pulse 94  Temp(Src) 97.9 F (36.6 C)  Resp 16  Ht 5\' 6"  (1.676 m)  Wt 128 lb (58.06 kg)  BMI 20.67 kg/m2  SpO2 100%  BP/Weight 11/26/2014 10/23/2014 0/16/0109  Systolic BP 323 557 322  Diastolic BP 76 80 81  Wt. (Lbs) 128 127.6 127  BMI 20.67 20.61 20.51   Physical Exam  Constitutional: He appears well-developed and well-nourished. No distress.  HENT:  Head: Normocephalic and atraumatic.  Neck: Normal range of motion. Neck supple.  Cardiovascular: Normal rate, regular rhythm, normal heart sounds and intact distal pulses.   Pulmonary/Chest: Effort normal and breath sounds normal.  Abdominal: Soft. Bowel sounds  are normal. He exhibits no distension and no mass. There is tenderness. There is rebound and guarding.  Musculoskeletal: He exhibits no edema.  Neurological: He is alert.  Skin: Skin is warm and dry. No rash noted. No erythema.  Psychiatric: He has a normal mood and affect.   Assessment & Plan:   Problem List Items Addressed This Visit    Bone pain     Suspect hypokalemia since patient taking lasix without supplement K+ Treating pain Checking labs CBC with diff and CMP       Relevant Medications   acetaminophen-codeine (TYLENOL #3) 300-30 MG tablet   Other Relevant Orders   COMPLETE METABOLIC PANEL WITH GFR   CBC with Differential   Insomnia - Primary   Relevant Medications   zolpidem (AMBIEN) 10 MG tablet    Other Visit Diagnoses    Smoker        Relevant Medications    varenicline (CHANTIX STARTING MONTH PAK) 0.5 MG X 11 & 1 MG X 42 tablet    varenicline (CHANTIX CONTINUING MONTH PAK) 1 MG tablet       No orders of the defined types were placed in this encounter.    Follow-up: No Follow-up on file.   Boykin Nearing MD

## 2014-11-26 NOTE — Patient Instructions (Signed)
[name],  Thank you for coming in today. It was a pleasure meeting you. I look forward to being your primary doctor.   [name] was seen today for establish care and joint swelling.  Diagnoses and all orders for this visit:  Insomnia -     Discontinue: zolpidem (AMBIEN) 5 MG tablet; Take 1 tablet (5 mg total) by mouth at bedtime as needed for sleep. -     zolpidem (AMBIEN) 10 MG tablet; Take 1 tablet (10 mg total) by mouth at bedtime as needed for sleep.  Bone pain -     COMPLETE METABOLIC PANEL WITH GFR -     CBC with Differential -     acetaminophen-codeine (TYLENOL #3) 300-30 MG tablet; Take 1 tablet by mouth every 8 (eight) hours as needed for moderate pain.  Smoker -     varenicline (CHANTIX STARTING MONTH PAK) 0.5 MG X 11 & 1 MG X 42 tablet; Taper per packet insert -     varenicline (CHANTIX CONTINUING MONTH PAK) 1 MG tablet; Take 1 tablet (1 mg total) by mouth 2 (two) times daily.   Apply for PASS for chantix   Please apply for [place] discount and orange card, you can also inquire if any of your medications are on the PASS (medications assistance) list.   F/u in 4-6 weeks for bone pain Call with update on ambien  Dr. Adrian Blackwater

## 2014-11-27 DIAGNOSIS — D649 Anemia, unspecified: Secondary | ICD-10-CM | POA: Insufficient documentation

## 2014-11-27 MED ORDER — FERROUS SULFATE 325 (65 FE) MG PO TABS: 325.0000 mg | ORAL_TABLET | Freq: Two times a day (BID) | ORAL | Status: DC

## 2014-11-27 NOTE — Addendum Note (Signed)
Addended by: Boykin Nearing on: 11/27/2014 08:46 AM   Modules accepted: Orders

## 2014-12-04 ENCOUNTER — Telehealth: Payer: Self-pay | Admitting: Family Medicine

## 2014-12-04 DIAGNOSIS — G4700 Insomnia, unspecified: Secondary | ICD-10-CM

## 2014-12-04 NOTE — Telephone Encounter (Signed)
Patient called requesting a med refill on zolpidem (AMBIEN) 10 MG tablet, patient stated this med works much better than traZODone (DESYREL) 100 MG tablet. Please f/u with pt.

## 2014-12-07 NOTE — Telephone Encounter (Signed)
Patient called requesting his lab results, also want a med refill on zolpidem (AMBIEN) 10 MG tablet. Please f/u with pt.

## 2014-12-15 NOTE — Telephone Encounter (Signed)
Patient called requesting his lab results, also want a med refill on zolpidem (AMBIEN) 10 MG tablet. Please f/u with pt.

## 2014-12-21 NOTE — Telephone Encounter (Signed)
Pt. Called requesting a med refill on zolpidem (AMBIEN) 10 MG tablet. Please f/u with pt. Pt. Stated he is out of his medication.

## 2014-12-24 MED ORDER — ZOLPIDEM TARTRATE 10 MG PO TABS: 10.0000 mg | ORAL_TABLET | Freq: Every evening | ORAL | Status: DC | PRN

## 2014-12-24 NOTE — Telephone Encounter (Signed)
Pt notified Rx at front office ready to be pickup 

## 2014-12-24 NOTE — Telephone Encounter (Signed)
ambien refilled Please call patient to pick up

## 2014-12-29 ENCOUNTER — Encounter: Payer: Self-pay | Admitting: Family Medicine

## 2014-12-29 ENCOUNTER — Other Ambulatory Visit: Payer: Self-pay | Admitting: Family Medicine

## 2014-12-29 ENCOUNTER — Ambulatory Visit: Payer: Self-pay | Attending: Family Medicine | Admitting: Family Medicine

## 2014-12-29 VITALS — BP 122/76 | HR 82 | Temp 98.0°F | Resp 16 | Ht 66.0 in | Wt 133.0 lb

## 2014-12-29 DIAGNOSIS — F17200 Nicotine dependence, unspecified, uncomplicated: Secondary | ICD-10-CM

## 2014-12-29 DIAGNOSIS — M2550 Pain in unspecified joint: Secondary | ICD-10-CM | POA: Insufficient documentation

## 2014-12-29 DIAGNOSIS — Z720 Tobacco use: Secondary | ICD-10-CM | POA: Insufficient documentation

## 2014-12-29 MED ORDER — VARENICLINE TARTRATE 1 MG PO TABS: 1.0000 mg | ORAL_TABLET | Freq: Two times a day (BID) | ORAL | Status: DC

## 2014-12-29 MED ORDER — TRAMADOL HCL 50 MG PO TABS: 50.0000 mg | ORAL_TABLET | Freq: Three times a day (TID) | ORAL | Status: DC | PRN

## 2014-12-29 MED ORDER — MELOXICAM 15 MG PO TABS: 15.0000 mg | ORAL_TABLET | Freq: Every day | ORAL | Status: DC

## 2014-12-29 MED ORDER — VITAMIN D (ERGOCALCIFEROL) 1.25 MG (50000 UNIT) PO CAPS: 50000.0000 [IU] | ORAL_CAPSULE | ORAL | Status: DC

## 2014-12-29 MED ORDER — DICLOFENAC SODIUM 1 % TD GEL: 2.0000 g | Freq: Four times a day (QID) | TRANSDERMAL | Status: DC

## 2014-12-29 MED ORDER — VARENICLINE TARTRATE 0.5 MG X 11 & 1 MG X 42 PO MISC: ORAL | Status: DC

## 2014-12-29 NOTE — Patient Instructions (Signed)
[name] was seen today for joint swelling.  Diagnoses and all orders for this visit:  Smoker -     varenicline (CHANTIX CONTINUING MONTH PAK) 1 MG tablet; Take 1 tablet (1 mg total) by mouth 2 (two) times daily. -     varenicline (CHANTIX STARTING MONTH PAK) 0.5 MG X 11 & 1 MG X 42 tablet; Taper per packet insert  Arthralgia -     Vitamin D, Ergocalciferol, (DRISDOL) 50000 UNITS CAPS capsule; Take 1 capsule (50,000 Units total) by mouth every 7 (seven) days. For 8 weeks -     diclofenac sodium (VOLTAREN) 1 % GEL; Apply 2 g topically 4 (four) times daily. -     meloxicam (MOBIC) 15 MG tablet; Take 1 tablet (15 mg total) by mouth daily. -     traMADol (ULTRAM) 50 MG tablet; Take 1 tablet (50 mg total) by mouth every 8 (eight) hours as needed.   Try glucosamine-chondroitin for joint pain as well  Try vit B12 for energy   F/u in 4 weeks for joint pain   Dr. Adrian Blackwater

## 2014-12-29 NOTE — Progress Notes (Signed)
C/C joint pain x 1 month Pain worsen in the morning, unable to hold a coffee cup  Pian scale #7  Tobacco user 1 ppday

## 2014-12-29 NOTE — Progress Notes (Signed)
Patient ID: [name], male   DOB: 07/26/1965, 49 y.o.   MRN: [id number]   Subjective:  Patient ID: [name], male    DOB: [birth year]  Age: 49 y.o. MRN: [id number]  CC: Joint Pain   HPI Ladanian Kelter presents for    1. Joint pain: x 1 month.  in hands, knees and ankles. No swelling, redness or injury. Tylenol #3 has not helped. He admits to intermittent dizziness. He declines lab draw for vit D check.   2. Smoking: would like to quit. Would like chantix refilled.   Social History  Substance Use Topics  . Smoking status: Current Every Day Smoker -- 0.50 packs/day    Types: Cigarettes  . Smokeless tobacco: Never Used  . Alcohol Use: Yes     Comment: pt states last drink was 3 days ago    Outpatient Prescriptions Prior to Visit  Medication Sig Dispense Refill  . ferrous sulfate 325 (65 FE) MG tablet Take 1 tablet (325 mg total) by mouth 2 (two) times daily with a meal. 60 tablet 1  . zolpidem (AMBIEN) 10 MG tablet Take 1 tablet (10 mg total) by mouth at bedtime as needed for sleep. 30 tablet 1  . acetaminophen-codeine (TYLENOL #3) 300-30 MG tablet Take 1 tablet by mouth every 8 (eight) hours as needed for moderate pain. (Patient not taking: Reported on 12/29/2014) 60 tablet 0  . furosemide (LASIX) 20 MG tablet Take 1 tablet (20 mg total) by mouth daily. (Patient not taking: Reported on 12/29/2014) 30 tablet 1  . traZODone (DESYREL) 100 MG tablet Take 1 tablet (100 mg total) by mouth at bedtime as needed for sleep. (Patient not taking: Reported on 12/29/2014) 30 tablet 1  . varenicline (CHANTIX CONTINUING MONTH PAK) 1 MG tablet Take 1 tablet (1 mg total) by mouth 2 (two) times daily. (Patient not taking: Reported on 12/29/2014) 60 tablet 1  . varenicline (CHANTIX STARTING MONTH PAK) 0.5 MG X 11 & 1 MG X 42 tablet Taper per packet insert (Patient not taking: Reported on 12/29/2014) 53 tablet 0   No facility-administered medications prior to visit.    ROS Review of  Systems  Constitutional: Negative for fever, chills, fatigue and unexpected weight change.  Eyes: Negative for visual disturbance.  Respiratory: Negative for cough and shortness of breath.   Cardiovascular: Negative for chest pain, palpitations and leg swelling.  Gastrointestinal: Negative for nausea, vomiting, abdominal pain, diarrhea, constipation and blood in stool.  Endocrine: Negative for polydipsia, polyphagia and polyuria.  Musculoskeletal: Positive for arthralgias. Negative for myalgias, back pain, joint swelling, gait problem and neck pain.  Skin: Negative for rash.  Allergic/Immunologic: Negative for immunocompromised state.  Neurological: Positive for dizziness.  Hematological: Negative for adenopathy. Does not bruise/bleed easily.  Psychiatric/Behavioral: Negative for suicidal ideas, sleep disturbance and dysphoric mood. The patient is not nervous/anxious.     Objective:  BP 122/76 mmHg  Pulse 82  Temp(Src) 98 F (36.7 C) (Oral)  Resp 16  Ht 5\' 6"  (1.676 m)  Wt 133 lb (60.328 kg)  BMI 21.48 kg/m2  SpO2 100%  BP/Weight 12/29/2014 11/26/2014 9/44/9675  Systolic BP 916 384 665  Diastolic BP 76 76 80  Wt. (Lbs) 133 128 127.6  BMI 21.48 20.67 20.61    Physical Exam  Constitutional: He appears well-developed and well-nourished. No distress.  HENT:  Head: Normocephalic and atraumatic.  Neck: Normal range of motion. Neck supple.  Cardiovascular: Normal rate, regular rhythm, normal heart sounds and intact distal pulses.  Pulmonary/Chest: Effort normal and breath sounds normal.  Musculoskeletal: He exhibits no edema.  Neurological: He is alert.  Skin: Skin is warm and dry. No rash noted. No erythema.  Psychiatric: He has a normal mood and affect.     Assessment & Plan:   Problem List Items Addressed This Visit    Arthralgia   Relevant Medications   Vitamin D, Ergocalciferol, (DRISDOL) 50000 UNITS CAPS capsule   diclofenac sodium (VOLTAREN) 1 % GEL   meloxicam  (MOBIC) 15 MG tablet   traMADol (ULTRAM) 50 MG tablet    Other Visit Diagnoses    Smoker    -  Primary    Relevant Medications    varenicline (CHANTIX CONTINUING MONTH PAK) 1 MG tablet    varenicline (CHANTIX STARTING MONTH PAK) 0.5 MG X 11 & 1 MG X 42 tablet       Meds ordered this encounter  Medications  . varenicline (CHANTIX CONTINUING MONTH PAK) 1 MG tablet    Sig: Take 1 tablet (1 mg total) by mouth 2 (two) times daily.    Dispense:  60 tablet    Refill:  1  . varenicline (CHANTIX STARTING MONTH PAK) 0.5 MG X 11 & 1 MG X 42 tablet    Sig: Taper per packet insert    Dispense:  53 tablet    Refill:  0  . Vitamin D, Ergocalciferol, (DRISDOL) 50000 UNITS CAPS capsule    Sig: Take 1 capsule (50,000 Units total) by mouth every 7 (seven) days. For 8 weeks    Dispense:  8 capsule    Refill:  0  . diclofenac sodium (VOLTAREN) 1 % GEL    Sig: Apply 2 g topically 4 (four) times daily.    Dispense:  100 g    Refill:  2  . meloxicam (MOBIC) 15 MG tablet    Sig: Take 1 tablet (15 mg total) by mouth daily.    Dispense:  30 tablet    Refill:  1  . traMADol (ULTRAM) 50 MG tablet    Sig: Take 1 tablet (50 mg total) by mouth every 8 (eight) hours as needed.    Dispense:  30 tablet    Refill:  0    Follow-up: No Follow-up on file.   Boykin Nearing MD

## 2015-02-10 ENCOUNTER — Telehealth: Payer: Self-pay | Admitting: Family Medicine

## 2015-02-10 DIAGNOSIS — D509 Iron deficiency anemia, unspecified: Secondary | ICD-10-CM

## 2015-02-10 NOTE — Telephone Encounter (Signed)
Pt. Called requesting a med refill on all his current medication. Please f/u with pt.

## 2015-02-16 NOTE — Telephone Encounter (Signed)
Pt. Called requesting a med refill on all his current medication. Please f/u with pt.

## 2015-02-23 MED ORDER — FERROUS SULFATE 325 (65 FE) MG PO TABS: 325.0000 mg | ORAL_TABLET | Freq: Two times a day (BID) | ORAL | Status: DC

## 2015-02-23 NOTE — Telephone Encounter (Signed)
Ferrous sulfate refills send to [facility]

## 2015-02-24 ENCOUNTER — Telehealth: Payer: Self-pay | Admitting: Family Medicine

## 2015-02-24 DIAGNOSIS — G4700 Insomnia, unspecified: Secondary | ICD-10-CM

## 2015-02-24 NOTE — Telephone Encounter (Signed)
Patient called requesting medication refill for ambien Please follow up.

## 2015-02-25 MED ORDER — ZOLPIDEM TARTRATE 10 MG PO TABS: 10.0000 mg | ORAL_TABLET | Freq: Every evening | ORAL | Status: DC | PRN

## 2015-02-25 NOTE — Telephone Encounter (Signed)
ambien ready for pick up

## 2015-02-25 NOTE — Telephone Encounter (Signed)
[facility] Rx at front office ready to be pickup 

## 2015-03-15 ENCOUNTER — Encounter: Payer: Self-pay | Admitting: Family Medicine

## 2015-03-15 ENCOUNTER — Ambulatory Visit: Payer: Self-pay | Attending: Family Medicine | Admitting: Family Medicine

## 2015-03-15 VITALS — BP 108/73 | HR 99 | Temp 98.7°F | Resp 16 | Ht 66.0 in | Wt 136.0 lb

## 2015-03-15 DIAGNOSIS — D509 Iron deficiency anemia, unspecified: Secondary | ICD-10-CM | POA: Insufficient documentation

## 2015-03-15 DIAGNOSIS — G4700 Insomnia, unspecified: Secondary | ICD-10-CM | POA: Insufficient documentation

## 2015-03-15 DIAGNOSIS — G8929 Other chronic pain: Secondary | ICD-10-CM | POA: Insufficient documentation

## 2015-03-15 DIAGNOSIS — F17210 Nicotine dependence, cigarettes, uncomplicated: Secondary | ICD-10-CM | POA: Insufficient documentation

## 2015-03-15 DIAGNOSIS — M2550 Pain in unspecified joint: Secondary | ICD-10-CM

## 2015-03-15 DIAGNOSIS — F329 Major depressive disorder, single episode, unspecified: Secondary | ICD-10-CM | POA: Insufficient documentation

## 2015-03-15 DIAGNOSIS — Z6821 Body mass index (BMI) 21.0-21.9, adult: Secondary | ICD-10-CM | POA: Insufficient documentation

## 2015-03-15 DIAGNOSIS — F5104 Psychophysiologic insomnia: Secondary | ICD-10-CM | POA: Insufficient documentation

## 2015-03-15 DIAGNOSIS — Z79899 Other long term (current) drug therapy: Secondary | ICD-10-CM | POA: Insufficient documentation

## 2015-03-15 MED ORDER — ZOLPIDEM TARTRATE 10 MG PO TABS: 10.0000 mg | ORAL_TABLET | Freq: Every evening | ORAL | Status: DC | PRN

## 2015-03-15 MED ORDER — ACETAMINOPHEN-CODEINE #3 300-30 MG PO TABS
1.0000 | ORAL_TABLET | Freq: Three times a day (TID) | ORAL | Status: DC | PRN
Start: 2015-03-15 — End: 2015-04-19

## 2015-03-15 MED ORDER — FERROUS SULFATE 325 (65 FE) MG PO TABS: 325.0000 mg | ORAL_TABLET | Freq: Two times a day (BID) | ORAL | Status: DC

## 2015-03-15 MED FILL — ACETAMINOPHEN/COD #3 TABLET: 300-30 | 20 days supply | Qty: 60 | Fill #0

## 2015-03-15 NOTE — Patient Instructions (Addendum)
[name] was seen today for medication reaction.  Diagnoses and all orders for this visit:  Insomnia -     zolpidem (AMBIEN) 10 MG tablet; Take 1 tablet (10 mg total) by mouth at bedtime as needed for sleep.  Microcytic anemia -     ferrous sulfate 325 (65 FE) MG tablet; Take 1 tablet (325 mg total) by mouth 2 (two) times daily with a meal. -     CBC  Arthralgia -     Ambulatory referral to [facility] -     acetaminophen-codeine (TYLENOL #3) 300-30 MG tablet; Take 1 tablet by mouth every 8 (eight) hours as needed for moderate pain.   Call for prescription refills Follow up in 6 months for joint pain and insomnia   Dr. Adrian Blackwater

## 2015-03-15 NOTE — Addendum Note (Signed)
Addended by: Boykin Nearing on: 03/15/2015 05:09 PM   Modules accepted: Orders

## 2015-03-15 NOTE — Progress Notes (Signed)
F/U Medicine refills Pain scale #7 on hands and join  Tobacco user 1ppday  No suicidal thought in the past two week

## 2015-03-15 NOTE — Progress Notes (Signed)
Patient ID: [name], male   DOB: [birth year], 50 y.o.   MRN: [id number]   Subjective:  Patient ID: [name], male    DOB: 1966/01/26  Age: 50 y.o. MRN: [id number]  CC: Medication Refill   HPI Kahleil Pena presents for    1. Arthralgia: he has chronic pain in hands, knees and ankles. No swelling. No recent injury. Tylenol #3 refill requested. Stronger pain medicine if available requested.   2. Insomnia: he has chronic insomnia with depressed mood. He has tried celexa and trazodone in the past without improvement. He worries about his child. He denies suicidal ideation. He denies ETOH.  Social History  Substance Use Topics  . Smoking status: Current Every Day Smoker -- 0.50 packs/day    Types: Cigarettes  . Smokeless tobacco: Never Used  . Alcohol Use: Yes     Comment: pt states last drink was 3 days ago    Outpatient Prescriptions Prior to Visit  Medication Sig Dispense Refill  . diclofenac sodium (VOLTAREN) 1 % GEL Apply 2 g topically 4 (four) times daily. (Patient not taking: Reported on 03/15/2015) 100 g 2  . ferrous sulfate 325 (65 FE) MG tablet Take 1 tablet (325 mg total) by mouth 2 (two) times daily with a meal. (Patient not taking: Reported on 03/15/2015) 60 tablet 1  . meloxicam (MOBIC) 15 MG tablet Take 1 tablet (15 mg total) by mouth daily. (Patient not taking: Reported on 03/15/2015) 30 tablet 1  . traMADol (ULTRAM) 50 MG tablet Take 1 tablet (50 mg total) by mouth every 8 (eight) hours as needed. (Patient not taking: Reported on 03/15/2015) 30 tablet 0  . varenicline (CHANTIX CONTINUING MONTH PAK) 1 MG tablet Take 1 tablet (1 mg total) by mouth 2 (two) times daily. (Patient not taking: Reported on 03/15/2015) 60 tablet 1  . varenicline (CHANTIX STARTING MONTH PAK) 0.5 MG X 11 & 1 MG X 42 tablet Taper per packet insert (Patient not taking: Reported on 03/15/2015) 53 tablet 0  . Vitamin D, Ergocalciferol, (DRISDOL) 50000 UNITS CAPS capsule Take 1 capsule  (50,000 Units total) by mouth every 7 (seven) days. For 8 weeks (Patient not taking: Reported on 03/15/2015) 8 capsule 0  . zolpidem (AMBIEN) 10 MG tablet Take 1 tablet (10 mg total) by mouth at bedtime as needed for sleep. (Patient not taking: Reported on 03/15/2015) 30 tablet 2   No facility-administered medications prior to visit.    ROS Review of Systems  Constitutional: Negative for fever, chills, fatigue and unexpected weight change.  Eyes: Negative for visual disturbance.  Respiratory: Negative for cough and shortness of breath.   Cardiovascular: Negative for chest pain, palpitations and leg swelling.  Gastrointestinal: Negative for nausea, vomiting, abdominal pain, diarrhea, constipation and blood in stool.  Endocrine: Negative for polydipsia, polyphagia and polyuria.  Musculoskeletal: Positive for arthralgias. Negative for myalgias, back pain, gait problem and neck pain.  Skin: Negative for rash.  Allergic/Immunologic: Negative for immunocompromised state.  Hematological: Negative for adenopathy. Does not bruise/bleed easily.  Psychiatric/Behavioral: Positive for sleep disturbance. Negative for suicidal ideas and dysphoric mood. The patient is not nervous/anxious.     Objective:  BP 108/73 mmHg  Pulse 99  Temp(Src) 98.7 F (37.1 C) (Oral)  Resp 16  Ht 5\' 6"  (1.676 m)  Wt 136 lb (61.689 kg)  BMI 21.96 kg/m2  SpO2 97%  BP/Weight 03/15/2015 12/29/2014 [id number]  Systolic BP 123XX123 123XX123 [id number]  Diastolic BP 73 76 76  Wt. (Lbs) 136 133 128  BMI 21.96 21.48 20.67   Physical Exam  Constitutional: He appears well-developed and well-nourished. No distress.  HENT:  Head: Normocephalic and atraumatic.  Neck: Normal range of motion. Neck supple.  Cardiovascular: Normal rate, regular rhythm, normal heart sounds and intact distal pulses.   Pulmonary/Chest: Effort normal.  Musculoskeletal: He exhibits no edema.  Neurological: He is alert.  Skin: Skin is warm and dry. No rash noted. No  erythema.  Psychiatric: He has a normal mood and affect.     Assessment & Plan:   Problem List Items Addressed This Visit    Arthralgia   Relevant Medications   acetaminophen-codeine (TYLENOL #3) 300-30 MG tablet   Other Relevant Orders   Ambulatory referral to Pain Clinic   Insomnia - Primary   Relevant Medications   zolpidem (AMBIEN) 10 MG tablet   Microcytic anemia   Relevant Medications   ferrous sulfate 325 (65 FE) MG tablet   Other Relevant Orders   CBC      Meds ordered this encounter  Medications  . zolpidem (AMBIEN) 10 MG tablet    Sig: Take 1 tablet (10 mg total) by mouth at bedtime as needed for sleep.    Dispense:  30 tablet    Refill:  2  . ferrous sulfate 325 (65 FE) MG tablet    Sig: Take 1 tablet (325 mg total) by mouth 2 (two) times daily with a meal.    Dispense:  60 tablet    Refill:  5  . acetaminophen-codeine (TYLENOL #3) 300-30 MG tablet    Sig: Take 1 tablet by mouth every 8 (eight) hours as needed for moderate pain.    Dispense:  60 tablet    Refill:  0    Follow-up: No Follow-up on file.   Ian Nearing MD

## 2015-03-26 MED FILL — ZOLPIDEM TARTRATE 10 MG TAB: 10 | 30 days supply | Qty: 30 | Fill #1

## 2015-04-16 ENCOUNTER — Telehealth: Payer: Self-pay | Admitting: Family Medicine

## 2015-04-16 DIAGNOSIS — M2550 Pain in unspecified joint: Secondary | ICD-10-CM

## 2015-04-16 NOTE — Telephone Encounter (Signed)
Pt. Called requesting a med refill on Tylenol # 3. Please f/u with pt. °

## 2015-04-19 MED ORDER — ACETAMINOPHEN-CODEINE #3 300-30 MG PO TABS: 1.0000 | ORAL_TABLET | Freq: Three times a day (TID) | ORAL | Status: DC | PRN

## 2015-04-19 NOTE — Telephone Encounter (Signed)
Tylenol #3 refilled Ready for pick up

## 2015-04-19 NOTE — Telephone Encounter (Signed)
Pt notified Rx at front office ready to be pick up Bring picture ID to pick up

## 2015-04-23 ENCOUNTER — Telehealth: Payer: Self-pay | Admitting: Family Medicine

## 2015-04-23 MED FILL — ZOLPIDEM TARTRATE 10 MG TAB: 10 | 30 days supply | Qty: 30 | Fill #2

## 2015-04-23 MED FILL — ACETAMINOPHEN/COD #3 TABLET: 300-30 | 20 days supply | Qty: 60 | Fill #0

## 2015-04-23 NOTE — Telephone Encounter (Signed)
Pt. Came in to facility requesting a med refill on Ambien. Please f/u with pt.

## 2015-04-28 NOTE — Telephone Encounter (Signed)
Patient had 2 refills on last Rx, he is due for another Rx in 06/13/15

## 2015-04-28 NOTE — Telephone Encounter (Signed)
Pt. Returned call. Please f/u with pt. °

## 2015-04-28 NOTE — Telephone Encounter (Signed)
LVM to return call.

## 2015-04-29 NOTE — Telephone Encounter (Signed)
LVM to return call x2   Please let pt know Has Refills Ambien at pharmacy Need to call pharmacy for refills

## 2015-05-17 ENCOUNTER — Other Ambulatory Visit: Payer: Self-pay | Admitting: Family Medicine

## 2015-05-17 DIAGNOSIS — M898X9 Other specified disorders of bone, unspecified site: Secondary | ICD-10-CM

## 2015-05-17 NOTE — Telephone Encounter (Signed)
Pt. Called requesting a med refill on the following medications:  zolpidem (AMBIEN) 10 MG tablet acetaminophen-codeine (TYLENOL #3) 300-30 MG tablet  Please f/u with pt.

## 2015-05-18 ENCOUNTER — Other Ambulatory Visit: Payer: Self-pay | Admitting: Family Medicine

## 2015-05-18 DIAGNOSIS — G4700 Insomnia, unspecified: Secondary | ICD-10-CM

## 2015-05-18 MED ORDER — ZOLPIDEM TARTRATE 10 MG PO TABS: 10.0000 mg | ORAL_TABLET | Freq: Every evening | ORAL | Status: DC | PRN

## 2015-05-18 NOTE — Telephone Encounter (Signed)
[name] has 2 refills from last refill done on 03/15/15, next refill due 06/13/15 tyelnol #3 refilled and ready for pick up  Next month will refill both with 2 refills to keep them on same schedule

## 2015-05-20 MED FILL — ACETAMINOPHEN/COD #3 TABLET: 300-30 | 10 days supply | Qty: 60 | Fill #0

## 2015-05-21 MED FILL — ZOLPIDEM TARTRATE 10 MG TAB: 10 | 30 days supply | Qty: 30 | Fill #0

## 2015-06-18 ENCOUNTER — Telehealth: Payer: Self-pay | Admitting: Family Medicine

## 2015-06-18 DIAGNOSIS — M898X9 Other specified disorders of bone, unspecified site: Secondary | ICD-10-CM

## 2015-06-18 DIAGNOSIS — G4700 Insomnia, unspecified: Secondary | ICD-10-CM

## 2015-06-18 NOTE — Telephone Encounter (Signed)
Pt. Called requesting med refill on the following medications:  Ambien  Tylenol # 3  Please f/u

## 2015-06-22 MED ORDER — ACETAMINOPHEN-CODEINE #3 300-30 MG PO TABS
1.0000 | ORAL_TABLET | Freq: Three times a day (TID) | ORAL | Status: DC | PRN
Start: 2015-06-22 — End: 2015-06-27

## 2015-06-22 MED ORDER — ZOLPIDEM TARTRATE 10 MG PO TABS: 10.0000 mg | ORAL_TABLET | Freq: Every evening | ORAL | Status: DC | PRN

## 2015-06-22 MED ORDER — ACETAMINOPHEN-CODEINE #3 300-30 MG PO TABS: 1.0000 | ORAL_TABLET | Freq: Three times a day (TID) | ORAL | Status: DC | PRN

## 2015-06-22 NOTE — Telephone Encounter (Signed)
Pt notified Rx at front office ready to be pickup 

## 2015-06-22 NOTE — Telephone Encounter (Signed)
Refilled and ready for pickup.  

## 2015-06-24 ENCOUNTER — Encounter (HOSPITAL_COMMUNITY): Payer: Self-pay | Admitting: *Deleted

## 2015-06-24 DIAGNOSIS — G9340 Encephalopathy, unspecified: Secondary | ICD-10-CM | POA: Diagnosis present

## 2015-06-24 DIAGNOSIS — E872 Acidosis: Principal | ICD-10-CM | POA: Diagnosis present

## 2015-06-24 DIAGNOSIS — E876 Hypokalemia: Secondary | ICD-10-CM | POA: Diagnosis present

## 2015-06-24 DIAGNOSIS — K7030 Alcoholic cirrhosis of liver without ascites: Secondary | ICD-10-CM | POA: Diagnosis present

## 2015-06-24 DIAGNOSIS — E119 Type 2 diabetes mellitus without complications: Secondary | ICD-10-CM | POA: Diagnosis present

## 2015-06-24 DIAGNOSIS — E512 Wernicke's encephalopathy: Secondary | ICD-10-CM | POA: Diagnosis present

## 2015-06-24 DIAGNOSIS — E785 Hyperlipidemia, unspecified: Secondary | ICD-10-CM | POA: Diagnosis present

## 2015-06-24 DIAGNOSIS — F17210 Nicotine dependence, cigarettes, uncomplicated: Secondary | ICD-10-CM | POA: Diagnosis present

## 2015-06-24 DIAGNOSIS — F10239 Alcohol dependence with withdrawal, unspecified: Secondary | ICD-10-CM | POA: Diagnosis present

## 2015-06-24 DIAGNOSIS — Z79899 Other long term (current) drug therapy: Secondary | ICD-10-CM

## 2015-06-24 LAB — COMPREHENSIVE METABOLIC PANEL
ALK PHOS: 131 U/L — AB (ref 38–126)
ALT: 54 U/L (ref 17–63)
ANION GAP: 24 — AB (ref 5–15)
AST: 77 U/L — ABNORMAL HIGH (ref 15–41)
Albumin: 4.5 g/dL (ref 3.5–5.0)
BILIRUBIN TOTAL: 2.3 mg/dL — AB (ref 0.3–1.2)
BUN: 24 mg/dL — ABNORMAL HIGH (ref 6–20)
CALCIUM: 9.7 mg/dL (ref 8.9–10.3)
CO2: 18 mmol/L — ABNORMAL LOW (ref 22–32)
Chloride: 92 mmol/L — ABNORMAL LOW (ref 101–111)
Creatinine, Ser: 1.42 mg/dL — ABNORMAL HIGH (ref 0.61–1.24)
GFR calc non Af Amer: 56 mL/min — ABNORMAL LOW (ref >60)
Glucose, Bld: 79 mg/dL (ref 65–99)
POTASSIUM: 3.9 mmol/L (ref 3.5–5.1)
Sodium: 134 mmol/L — ABNORMAL LOW (ref 135–145)
TOTAL PROTEIN: 8.5 g/dL — AB (ref 6.5–8.1)

## 2015-06-24 LAB — CBC
HCT: 45.6 % (ref 39.0–52.0)
Hemoglobin: 15.2 g/dL (ref 13.0–17.0)
MCH: 31 pg (ref 26.0–34.0)
MCHC: 33.3 g/dL (ref 30.0–36.0)
MCV: 93.1 fL (ref 78.0–100.0)
PLATELETS: 199 10*3/uL (ref 150–400)
RBC: 4.9 MIL/uL (ref 4.22–5.81)
RDW: 14 % (ref 11.5–15.5)
WBC: 13.5 10*3/uL — ABNORMAL HIGH (ref 4.0–10.5)

## 2015-06-24 LAB — URINALYSIS, ROUTINE W REFLEX MICROSCOPIC
Bilirubin Urine: NEGATIVE
Glucose, UA: NEGATIVE mg/dL
HGB URINE DIPSTICK: NEGATIVE
Ketones, ur: NEGATIVE mg/dL
Leukocytes, UA: NEGATIVE
Nitrite: NEGATIVE
Protein, ur: NEGATIVE mg/dL
SPECIFIC GRAVITY, URINE: 1.003 — AB (ref 1.005–1.030)
pH: 5.5 (ref 5.0–8.0)

## 2015-06-24 LAB — AMMONIA: Ammonia: 49 umol/L — ABNORMAL HIGH (ref 9–35)

## 2015-06-24 LAB — ETHANOL: Alcohol, Ethyl (B): <5 mg/dL (ref <5)

## 2015-06-24 NOTE — ED Notes (Signed)
Family member sates that they had not seen him for weeks but he was acting strange, confused and thought his mother was at the home today but she has been out of the country for weeks adn as far as the family member knows his son is alive adn well and pt believes that he is deceased.

## 2015-06-24 NOTE — ED Notes (Signed)
Pt appears discheveled, brought in by his brother in law after a mutual friend called and said something was wrong with this pt.  Family member found him in his home with some confusion, house messy, pt has lost weight.  Pt reports that he has been depressed and has not eated, he also reports that his son passed away but family member is not sure that this is true, pt is shaken as he tells me this.  Pt reports that he has been drinking some lately. Pt is oriented to his person and the situation.

## 2015-06-25 ENCOUNTER — Inpatient Hospital Stay (HOSPITAL_COMMUNITY)
Admission: EM | Admit: 2015-06-25 | Discharge: 2015-06-27 | DRG: 640 | Disposition: A | Discharge status: Home / Self Care | Payer: Self-pay | Attending: Internal Medicine | Admitting: Internal Medicine

## 2015-06-25 ENCOUNTER — Emergency Department (HOSPITAL_COMMUNITY): Payer: Self-pay

## 2015-06-25 ENCOUNTER — Inpatient Hospital Stay (HOSPITAL_COMMUNITY): Payer: MEDICAID

## 2015-06-25 DIAGNOSIS — K7460 Unspecified cirrhosis of liver: Secondary | ICD-10-CM | POA: Diagnosis present

## 2015-06-25 DIAGNOSIS — E8729 Other acidosis: Secondary | ICD-10-CM

## 2015-06-25 DIAGNOSIS — K7030 Alcoholic cirrhosis of liver without ascites: Secondary | ICD-10-CM

## 2015-06-25 DIAGNOSIS — Z720 Tobacco use: Secondary | ICD-10-CM

## 2015-06-25 DIAGNOSIS — E512 Wernicke's encephalopathy: Secondary | ICD-10-CM | POA: Diagnosis present

## 2015-06-25 DIAGNOSIS — G9340 Encephalopathy, unspecified: Secondary | ICD-10-CM

## 2015-06-25 DIAGNOSIS — E872 Acidosis: Principal | ICD-10-CM

## 2015-06-25 LAB — SALICYLATE LEVEL

## 2015-06-25 LAB — DIFFERENTIAL
BASOS PCT: 0 %
Basophils Absolute: 0 10*3/uL (ref 0.0–0.1)
EOS PCT: 1 %
Eosinophils Absolute: 0.1 10*3/uL (ref 0.0–0.7)
Lymphocytes Relative: 29 %
Lymphs Abs: 3.5 10*3/uL (ref 0.7–4.0)
MONOS PCT: 16 %
Monocytes Absolute: 1.9 10*3/uL — ABNORMAL HIGH (ref 0.1–1.0)
NEUTROS ABS: 6.6 10*3/uL (ref 1.7–7.7)
Neutrophils Relative %: 54 %

## 2015-06-25 LAB — PROTIME-INR
INR: 1.04 (ref 0.00–1.49)
Prothrombin Time: 13.8 seconds (ref 11.6–15.2)

## 2015-06-25 LAB — BASIC METABOLIC PANEL
ANION GAP: 15 (ref 5–15)
BUN: 15 mg/dL (ref 6–20)
CALCIUM: 8.1 mg/dL — AB (ref 8.9–10.3)
CO2: 20 mmol/L — AB (ref 22–32)
Chloride: 102 mmol/L (ref 101–111)
Creatinine, Ser: 0.85 mg/dL (ref 0.61–1.24)
GFR calc Af Amer: >60 mL/min (ref >60)
Glucose, Bld: 80 mg/dL (ref 65–99)
Potassium: 3.1 mmol/L — ABNORMAL LOW (ref 3.5–5.1)
Sodium: 137 mmol/L (ref 135–145)

## 2015-06-25 LAB — CBC
HEMATOCRIT: 38.8 % — AB (ref 39.0–52.0)
Hemoglobin: 13 g/dL (ref 13.0–17.0)
MCH: 30.7 pg (ref 26.0–34.0)
MCHC: 33.5 g/dL (ref 30.0–36.0)
MCV: 91.7 fL (ref 78.0–100.0)
PLATELETS: 168 10*3/uL (ref 150–400)
RBC: 4.23 MIL/uL (ref 4.22–5.81)
RDW: 13.9 % (ref 11.5–15.5)
WBC: 8.4 10*3/uL (ref 4.0–10.5)

## 2015-06-25 LAB — AMMONIA: Ammonia: 45 umol/L — ABNORMAL HIGH (ref 9–35)

## 2015-06-25 LAB — MAGNESIUM: Magnesium: 2.1 mg/dL (ref 1.7–2.4)

## 2015-06-25 LAB — I-STAT ARTERIAL BLOOD GAS, ED
ACID-BASE DEFICIT: 10 mmol/L — AB (ref 0.0–2.0)
Bicarbonate: 14.8 mEq/L — ABNORMAL LOW (ref 20.0–24.0)
O2 SAT: 97 %
PCO2 ART: 28.5 mmHg — AB (ref 35.0–45.0)
PH ART: 7.322 — AB (ref 7.350–7.450)
Patient temperature: 98.6
TCO2: 16 mmol/L (ref 0–100)
pO2, Arterial: 93 mmHg (ref 80.0–100.0)

## 2015-06-25 LAB — RAPID URINE DRUG SCREEN, HOSP PERFORMED
AMPHETAMINES: NOT DETECTED
Barbiturates: NOT DETECTED
Benzodiazepines: NOT DETECTED
Cocaine: NOT DETECTED
OPIATES: NOT DETECTED
Tetrahydrocannabinol: NOT DETECTED

## 2015-06-25 LAB — LACTIC ACID, PLASMA: LACTIC ACID, VENOUS: 0.6 mmol/L (ref 0.5–2.0)

## 2015-06-25 LAB — PHOSPHORUS
PHOSPHORUS: 4.8 mg/dL — AB (ref 2.5–4.6)
Phosphorus: 3.1 mg/dL (ref 2.5–4.6)

## 2015-06-25 LAB — CK: CK TOTAL: 209 U/L (ref 49–397)

## 2015-06-25 LAB — CBG MONITORING, ED: Glucose-Capillary: 82 mg/dL (ref 65–99)

## 2015-06-25 LAB — BETA-HYDROXYBUTYRIC ACID

## 2015-06-25 LAB — I-STAT CG4 LACTIC ACID, ED: Lactic Acid, Venous: 1.81 mmol/L (ref 0.5–2.0)

## 2015-06-25 LAB — TROPONIN I: Troponin I: <0.03 ng/mL (ref <0.031)

## 2015-06-25 MED ORDER — ADULT MULTIVITAMIN W/MINERALS CH
1.0000 | ORAL_TABLET | Freq: Every day | ORAL | Status: DC
  Administered 2015-06-26 – 2015-06-27 (×2): 1 via ORAL
  Filled 2015-06-25 (×2): qty 1

## 2015-06-25 MED ORDER — LORAZEPAM 2 MG/ML IJ SOLN
2.0000 mg | Freq: Once | INTRAMUSCULAR | Status: AC
  Administered 2015-06-25: 2 mg via INTRAVENOUS
  Filled 2015-06-25: qty 1

## 2015-06-25 MED ORDER — LORAZEPAM 2 MG/ML IJ SOLN
0.0000 mg | Freq: Four times a day (QID) | INTRAMUSCULAR | Status: DC
Start: 2015-06-25 — End: 2015-06-26
  Administered 2015-06-25 (×2): 2 mg via INTRAVENOUS
  Filled 2015-06-25 (×2): qty 1

## 2015-06-25 MED ORDER — CEFTRIAXONE SODIUM 2 G IJ SOLR
2.0000 g | INTRAMUSCULAR | Status: DC
  Filled 2015-06-25: qty 2

## 2015-06-25 MED ORDER — LACTULOSE 10 GM/15ML PO SOLN
30.0000 g | Freq: Three times a day (TID) | ORAL | Status: DC
  Administered 2015-06-25: 30 g via ORAL
  Filled 2015-06-25: qty 45

## 2015-06-25 MED ORDER — LORAZEPAM 2 MG/ML IJ SOLN
0.0000 mg | Freq: Two times a day (BID) | INTRAMUSCULAR | Status: DC
Start: 2015-06-27 — End: 2015-06-26

## 2015-06-25 MED ORDER — FOLIC ACID 1 MG PO TABS
1.0000 mg | ORAL_TABLET | Freq: Every day | ORAL | Status: DC
  Administered 2015-06-26 – 2015-06-27 (×2): 1 mg via ORAL
  Filled 2015-06-25 (×2): qty 1

## 2015-06-25 MED ORDER — DEXTROSE-NACL 5-0.45 % IV SOLN
INTRAVENOUS | Status: DC
  Administered 2015-06-25 (×2): via INTRAVENOUS

## 2015-06-25 MED ORDER — THIAMINE HCL 100 MG/ML IJ SOLN
500.0000 mg | Freq: Three times a day (TID) | INTRAVENOUS | Status: DC
  Administered 2015-06-25 (×2): 500 mg via INTRAVENOUS
  Filled 2015-06-25 (×7): qty 5

## 2015-06-25 MED ORDER — ONDANSETRON HCL 4 MG PO TABS: 4.0000 mg | ORAL_TABLET | Freq: Four times a day (QID) | ORAL | Status: DC | PRN

## 2015-06-25 MED ORDER — SODIUM CHLORIDE 0.9 % IV SOLN
15.0000 mg/kg | Freq: Once | INTRAVENOUS | Status: DC
  Filled 2015-06-25: qty 0.84

## 2015-06-25 MED ORDER — LORAZEPAM 2 MG/ML IJ SOLN: 1.0000 mg | Freq: Four times a day (QID) | INTRAMUSCULAR | Status: DC | PRN

## 2015-06-25 MED ORDER — DEXTROSE 5 % IV SOLN
1.0000 g | INTRAVENOUS | Status: DC
  Administered 2015-06-25: 1 g via INTRAVENOUS
  Filled 2015-06-25: qty 10

## 2015-06-25 MED ORDER — SODIUM CHLORIDE 0.9% FLUSH
3.0000 mL | Freq: Two times a day (BID) | INTRAVENOUS | Status: DC
  Administered 2015-06-25 (×2): 3 mL via INTRAVENOUS

## 2015-06-25 MED ORDER — SODIUM CHLORIDE 0.9 % IV BOLUS (SEPSIS)
1000.0000 mL | Freq: Once | INTRAVENOUS | Status: AC
  Administered 2015-06-25: 1000 mL via INTRAVENOUS

## 2015-06-25 MED ORDER — ENOXAPARIN SODIUM 30 MG/0.3ML ~~LOC~~ SOLN
30.0000 mg | SUBCUTANEOUS | Status: DC
  Administered 2015-06-26: 30 mg via SUBCUTANEOUS
  Filled 2015-06-25 (×3): qty 0.3

## 2015-06-25 MED ORDER — POTASSIUM CHLORIDE 10 MEQ/100ML IV SOLN
10.0000 meq | INTRAVENOUS | Status: AC
  Administered 2015-06-25 (×3): 10 meq via INTRAVENOUS
  Filled 2015-06-25 (×3): qty 100

## 2015-06-25 MED ORDER — ONDANSETRON HCL 4 MG/2ML IJ SOLN: 4.0000 mg | Freq: Four times a day (QID) | INTRAMUSCULAR | Status: DC | PRN

## 2015-06-25 MED ORDER — LORAZEPAM 1 MG PO TABS
1.0000 mg | ORAL_TABLET | Freq: Four times a day (QID) | ORAL | Status: DC | PRN
  Administered 2015-06-26: 1 mg via ORAL
  Filled 2015-06-25: qty 1

## 2015-06-25 MED ORDER — THIAMINE HCL 100 MG/ML IJ SOLN
Freq: Once | INTRAVENOUS | Status: AC
  Administered 2015-06-25: 03:00:00 via INTRAVENOUS
  Filled 2015-06-25: qty 1000

## 2015-06-25 NOTE — ED Notes (Signed)
Patient transported to Ultrasound 

## 2015-06-25 NOTE — ED Notes (Signed)
Lab called to have magnesium and phosphorus added on.

## 2015-06-25 NOTE — Progress Notes (Signed)
NURSING PROGRESS NOTE  Mitch Shillington [id number] Admission Data: 06/25/2015 7:00 PM Attending Provider: Thurnell Lose, MD [id number], Lennox Laity, MD Code Status: full  Allergies:  Review of patient's allergies indicates no known allergies. Past Medical History:   has a past medical history of Alcoholism ([place]); Hyperlipemia; and Cirrhosis ([place]). Past Surgical History:   has past surgical history that includes No past surgeries. Social History:   reports that he has been smoking Cigarettes.  He has been smoking about 0.50 packs per day. He has never used smokeless tobacco. He reports that he drinks alcohol. He reports that he does not use illicit drugs.  Wince Stenhouse is a 50 y.o. male patient admitted from ED:   Last Documented Vital Signs: Blood pressure 100/73, pulse 87, temperature 98.3 F (36.8 C), temperature source Oral, resp. rate 18, weight 55.747 kg (122 lb 14.4 oz), SpO2 99 %.  Cardiac Monitoring: Box # 04 in place. Cardiac monitor yields:normal sinus rhythm.  IV Fluids:  IV in place, occlusive dsg intact without redness, IV cath antecubital right, condition patent and no redness D5W/0.45 NaCl.   Skin: intact  Patient/Family orientated to room. Information packet given to patient/family. Admission inpatient armband information verified with patient/family to include name and date of birth and placed on patient arm. Side rails up x 2, fall assessment and education completed with patient/family. Patient/family able to verbalize understanding of risk associated with falls and verbalized understanding to call for assistance before getting out of bed. Call light within reach. Patient/family able to voice and demonstrate understanding of unit orientation instructions.    Will continue to evaluate and treat per MD orders.   Joslyn Hy, MSN, RN, [employer]

## 2015-06-25 NOTE — ED Notes (Signed)
Pharmacy notified on pt.'s admission orders.

## 2015-06-25 NOTE — Progress Notes (Signed)
PROGRESS NOTE                                                                                                                                                                                                             Patient Demographics:    [name], is a 50 y.o. male, DOB - October 10, 1965, [id number]  Admit date - 06/25/2015   Admitting Physician Norval Morton, MD  Outpatient Primary MD for the patient is Minerva Ends, MD  LOS - 0  Outpatient Specialists: [facility]  Chief Complaint  Patient presents with  . Altered Mental Status       Brief Narrative    [name] is a 50 y.o. male with medical history significant for alcoholism, HLD, cirrhosis; who presents for being acutely incoherent and confused with abnormal behavior. Patient is acutely altered and therefore history is mostly obtained by the patient's brother-in-law. He was found by a friend who is bringing him cigarettes, but after finding him then called the patient's brother-in-law. Patient's brother-in-law states that he was significantly confused stating that his mother was in the room when she is currently out of the country. The patient also kept stating that he was depressed because his son died. Patient brother-in-law states that his son is alive and well. He notes that the patient had similar presentation, but less severe back in 2013. Patient was noted to have lost a significant amount of weight. Patient did complain of right lower back pain. Otherwise patient denies any pain complaints of chest pain, shortness of breath, diarrhea, nausea, vomiting, or abdominal pain.  ED Course: On admission alcohol level undetectable, troponin negative, bicarbonate 18, anion gap of 24, and beta hydroxybutyric > 8. CT scan of the brain showed no acute abnormalities when compared to 2013. Arterial ABG ph 7.322, PCO2 28.5, and PCO2 93.    Subjective:    [name] today is somnolent in bed, withdraws to pain, moving all 4 extremities but remains poor historian. He denied headache chest or abdominal pain.   Assessment  & Plan :     1. Acute Encephalopathy - likely due to alcohol withdrawal, we will rule out SBP, ammonia is top normal. Will be admitted per [facility] protocol to stepdown, head CT nonacute, ultrasound-guided paracentesis, covered with Rocephin daily a sciatic fluid studies available. Minimize sedative medications  except those required to keep him under control per CIWA protocol. Does not appear to have focal deficits on exam. Nothing by mouth except medications until he wakes up.  2. Metabolic acidosis. High anion gap, due to alcoholic ketosis with high beta hydroxybutyrate. Supportive care with hydration and monitor. Will check cell isolate level.  3. Alcohol abuse. Will be counseled once he is more awake, folic acid and thiamine, note thiamine has been on high-dose as Wernicke's encephalopathy is in differential although low less likely.    4. Hypokalemia. Replaced.  5. DM type II. Not on any medications at home, check A1c, sliding scale.    Code Status : Full  Family Communication  : Sister over the phone  Disposition Plan  : Home 1-2 days  Barriers For Discharge : Encephalopathy  Consults  : None  Procedures  :   CT Head - Non acute   US Paracentesis -    DVT Prophylaxis  :  Lovenox   Lab Results  Component Value Date   PLT 168 06/25/2015    Antibiotics  :     Anti-infectives    Start     Dose/Rate Route Frequency Ordered Stop   06/25/15 1215  cefTRIAXone (ROCEPHIN) 1 g in dextrose 5 % 50 mL IVPB     1 g 100 mL/hr over 30 Minutes Intravenous Every 24 hours 06/25/15 1212          Objective:   Filed Vitals:   06/25/15 0845 06/25/15 0915 06/25/15 0945 06/25/15 1015  BP: 109/72 110/78 121/81 114/80  Pulse: 103 99 97 95  Temp:      TempSrc:      Resp:      Weight:      SpO2:  98% 100% 100% 100%    Wt Readings from Last 3 Encounters:  06/24/15 55.747 kg (122 lb 14.4 oz)  03/15/15 61.689 kg (136 lb)  12/29/14 60.328 kg (133 lb)     Intake/Output Summary (Last 24 hours) at 06/25/15 1212 Last data filed at 06/25/15 0527  Gross per 24 hour  Intake   1000 ml  Output      0 ml  Net   1000 ml     Physical Exam  Awake Alert, Oriented X 3, No new F.N deficits, Normal affect .AT,PERRAL Supple Neck,No JVD, No cervical lymphadenopathy appriciated.  Symmetrical Chest wall movement, Good air movement bilaterally, CTAB RRR,No Gallops,Rubs or new Murmurs, No Parasternal Heave +ve B.Sounds, Abd Soft - mild ascites, No tenderness, No organomegaly appriciated, No rebound - guarding or rigidity. No Cyanosis, Clubbing or edema, No new Rash or bruise    Data Review:    CBC  Recent Labs Lab 06/24/15 2018 06/25/15 0043 06/25/15 0510  WBC 13.5*  --  8.4  HGB 15.2  --  13.0  HCT 45.6  --  38.8*  PLT 199  --  168  MCV 93.1  --  91.7  MCH 31.0  --  30.7  MCHC 33.3  --  33.5  RDW 14.0  --  13.9  LYMPHSABS  --  3.5  --   MONOABS  --  1.9*  --   EOSABS  --  0.1  --   BASOSABS  --  0.0  --     Chemistries   Recent Labs Lab 06/24/15 2018 06/25/15 0033 06/25/15 0915  NA 134*  --  137  K 3.9  --  3.1*  CL 92*  --  102  CO2 18*  --  20*  GLUCOSE 79  --  80  BUN 24*  --  15  CREATININE 1.42*  --  0.85  CALCIUM 9.7  --  8.1*  MG  --  2.1  --   AST 77*  --   --   ALT 54  --   --   ALKPHOS 131*  --   --   BILITOT 2.3*  --   --    ------------------------------------------------------------------------------------------------------------------ No results for input(s): CHOL, HDL, LDLCALC, TRIG, CHOLHDL, LDLDIRECT in the last 72 hours.  Lab Results  Component Value Date   HGBA1C 5.1 10/23/2014   ------------------------------------------------------------------------------------------------------------------ No results for input(s): TSH, T4TOTAL,  T3FREE, THYROIDAB in the last 72 hours.  Invalid input(s): FREET3 ------------------------------------------------------------------------------------------------------------------ No results for input(s): VITAMINB12, FOLATE, FERRITIN, TIBC, IRON, RETICCTPCT in the last 72 hours.  Coagulation profile  Recent Labs Lab 06/25/15 0043  INR 1.04    No results for input(s): DDIMER in the last 72 hours.  Cardiac Enzymes  Recent Labs Lab 06/25/15 0043  TROPONINI <0.03   ------------------------------------------------------------------------------------------------------------------ No results found for: BNP  Inpatient Medications  Scheduled Meds: . enoxaparin (LOVENOX) injection  30 mg Subcutaneous Q24H  . folic acid  1 mg Oral Daily  . LORazepam  0-4 mg Intravenous Q6H   Followed by  . [START ON 06/27/2015] LORazepam  0-4 mg Intravenous Q12H  . multivitamin with minerals  1 tablet Oral Daily  . sodium chloride flush  3 mL Intravenous Q12H  . thiamine IV  500 mg Intravenous Q8H   Continuous Infusions: . cefTRIAXone (ROCEPHIN)  IV    . dextrose 5 % and 0.45% NaCl    . potassium chloride     PRN Meds:.LORazepam **OR** LORazepam, [DISCONTINUED] ondansetron **OR** ondansetron (ZOFRAN) IV  Micro Results No results found for this or any previous visit (from the past 240 hour(s)).  Radiology Reports Dg Chest 2 View  06/25/2015  CLINICAL DATA:  Acute onset of altered mental status. Initial encounter. EXAM: CHEST  2 VIEW COMPARISON:  Chest radiograph performed 03/16/2011 FINDINGS: The lungs are well-aerated. Mild peribronchial thickening is noted. Minimal bibasilar atelectasis or scarring is seen. There is no evidence of pleural effusion or pneumothorax. The heart is normal in size; the mediastinal contour is within normal limits. No acute osseous abnormalities are seen. IMPRESSION: Mild peribronchial thickening noted. Minimal bibasilar atelectasis or scarring seen. Electronically  Signed   By: Garald Balding M.D.   On: 06/25/2015 01:13   Ct Head Wo Contrast  06/25/2015  CLINICAL DATA:  Mental status change EXAM: CT HEAD WITHOUT CONTRAST TECHNIQUE: Contiguous axial images were obtained from the base of the skull through the vertex without intravenous contrast. COMPARISON:  03/15/2011 FINDINGS: Skull and Sinuses:Negative for fracture or destructive process. The visualized mastoids, middle ears, and imaged paranasal sinuses are clear. Visualized orbits: Negative. Brain: No evidence of acute infarction, hemorrhage, hydrocephalus, or mass lesion/mass effect. Stable mild generalized volume loss. IMPRESSION: No acute finding or change since 2013. Electronically Signed   By: Monte Fantasia M.D.   On: 06/25/2015 01:33    Time Spent in minutes  30   Jairus Tonne K M.D on 06/25/2015 at 12:12 PM  Between 7am to 7pm - Pager - [phone]  After 7pm go to [email or web address] - password [email or web address]  [facility] -  Office  [phone]

## 2015-06-25 NOTE — ED Notes (Signed)
ORDERED REGULAR TRAY [id number]

## 2015-06-25 NOTE — ED Notes (Signed)
Returned from [place]

## 2015-06-25 NOTE — ED Provider Notes (Addendum)
CSN: [id number]     Arrival date & time 06/24/15  1953 History  By signing my name below, I, Evelene Croon, attest that this documentation has been prepared under the direction and in the presence of Orpah Greek, MD . Electronically Signed: Evelene Croon, Scribe. 06/25/2015. 12:31 AM.    Chief Complaint  Patient presents with  . Altered Mental Status   LEVEL 5 CAVEAT DUE TO ALTERED MENTAL STATUS  The history is provided by the patient and a relative. No language interpreter was used.     HPI Comments:  [name] is a 50 y.o. male who presents to the Emergency Department for AMS since ~ 1800 today. Family states pt was incoherent with abnormal behavior today which is a change from baseline. Family also notes weight loss. Family is unsure of ETOH consumption but does not believe he has been drinking this evening. Pt denies excessive ASA use. He denies pain, SOB, nausea, and vomiting. Pt states he feels fine.  Pt states he feels depressed and states his son died, however family member states this is not true and his son is alive and well.    Past Medical History  Diagnosis Date  . Alcoholism ([place])   . Hyperlipemia   . Cirrhosis [facility])    Past Surgical History  Procedure Laterality Date  . No past surgeries     Family History  Problem Relation Age of Onset  . Coronary artery disease    . Diabetes type II     Social History  Substance Use Topics  . Smoking status: Current Every Day Smoker -- 0.50 packs/day    Types: Cigarettes  . Smokeless tobacco: Never Used  . Alcohol Use: Yes     Comment: pt states last drink was 3 days ago    Review of Systems  Unable to perform ROS: Mental status change   Allergies  Review of patient's allergies indicates no known allergies.  Home Medications   Prior to Admission medications   Medication Sig Start Date End Date Taking? Authorizing Provider  acetaminophen-codeine (TYLENOL #3) 300-30 MG tablet Take 1 tablet by mouth  every 8 (eight) hours as needed for moderate pain. 06/22/15   Josalyn Funches, MD  ferrous sulfate 325 (65 FE) MG tablet Take 1 tablet (325 mg total) by mouth 2 (two) times daily with a meal. 03/15/15   Josalyn Funches, MD  zolpidem (AMBIEN) 10 MG tablet Take 1 tablet (10 mg total) by mouth at bedtime as needed for sleep. 06/22/15   Josalyn Funches, MD   BP 115/85 mmHg  Pulse 98  Temp(Src) 98.4 F (36.9 C) (Oral)  Resp 14  Wt 122 lb 14.4 oz (55.747 kg)  SpO2 100% Physical Exam  Constitutional: He appears well-developed and well-nourished. No distress.  HENT:  Head: Normocephalic and atraumatic.  Right Ear: Hearing normal.  Left Ear: Hearing normal.  Nose: Nose normal.  Mouth/Throat: Oropharynx is clear and moist and mucous membranes are normal.  Eyes: Conjunctivae and EOM are normal. Pupils are equal, round, and reactive to light.  Neck: Normal range of motion. Neck supple.  Cardiovascular: Regular rhythm, S1 normal and S2 normal.  Exam reveals no gallop and no friction rub.   No murmur heard. Pulmonary/Chest: Effort normal and breath sounds normal. No respiratory distress. He exhibits no tenderness.  Abdominal: Soft. Normal appearance and bowel sounds are normal. There is no hepatosplenomegaly. There is no tenderness. There is no rebound, no guarding, no tenderness at McBurney's point and negative  Murphy's sign. No hernia.  Musculoskeletal: Normal range of motion.  Neurological: He is alert. He has normal strength. No cranial nerve deficit or sensory deficit. Coordination normal. GCS eye subscore is 4. GCS verbal subscore is 5. GCS motor subscore is 6.  Skin: Skin is warm, dry and intact. No rash noted. No cyanosis.  Psychiatric: His speech is normal. His mood appears anxious. Cognition and memory are impaired.  Nursing note and vitals reviewed.   ED Course  Procedures   DIAGNOSTIC STUDIES:  Oxygen Saturation is 100% on RA, normal by my interpretation.      Labs Review Labs  Reviewed  COMPREHENSIVE METABOLIC PANEL - Abnormal; Notable for the following:    Sodium 134 (*)    Chloride 92 (*)    CO2 18 (*)    BUN 24 (*)    Creatinine, Ser 1.42 (*)    Total Protein 8.5 (*)    AST 77 (*)    Alkaline Phosphatase 131 (*)    Total Bilirubin 2.3 (*)    GFR calc non Af Amer 56 (*)    Anion gap 24 (*)    All other components within normal limits  CBC - Abnormal; Notable for the following:    WBC 13.5 (*)    All other components within normal limits  AMMONIA - Abnormal; Notable for the following:    Ammonia 49 (*)    All other components within normal limits  URINALYSIS, ROUTINE W REFLEX MICROSCOPIC (NOT AT [facility]) - Abnormal; Notable for the following:    Specific Gravity, Urine 1.003 (*)    All other components within normal limits  BETA-HYDROXYBUTYRIC ACID - Abnormal; Notable for the following:    Beta-Hydroxybutyric Acid >8.00 (*)    All other components within normal limits  I-STAT ARTERIAL BLOOD GAS, ED - Abnormal; Notable for the following:    pH, Arterial 7.322 (*)    pCO2 arterial 28.5 (*)    Bicarbonate 14.8 (*)    Acid-base deficit 10.0 (*)    All other components within normal limits  CULTURE, BLOOD (ROUTINE X 2)  CULTURE, BLOOD (ROUTINE X 2)  ETHANOL  SALICYLATE LEVEL  PROTIME-INR  URINE RAPID DRUG SCREEN, HOSP PERFORMED  TROPONIN I  CK  DIFFERENTIAL  MAGNESIUM  PHOSPHORUS  CBG MONITORING, ED  I-STAT CG4 LACTIC ACID, ED    Imaging Review Dg Chest 2 View  06/25/2015  CLINICAL DATA:  Acute onset of altered mental status. Initial encounter. EXAM: CHEST  2 VIEW COMPARISON:  Chest radiograph performed 03/16/2011 FINDINGS: The lungs are well-aerated. Mild peribronchial thickening is noted. Minimal bibasilar atelectasis or scarring is seen. There is no evidence of pleural effusion or pneumothorax. The heart is normal in size; the mediastinal contour is within normal limits. No acute osseous abnormalities are seen. IMPRESSION: Mild peribronchial  thickening noted. Minimal bibasilar atelectasis or scarring seen. Electronically Signed   By: Garald Balding M.D.   On: 06/25/2015 01:13   Ct Head Wo Contrast  06/25/2015  CLINICAL DATA:  Mental status change EXAM: CT HEAD WITHOUT CONTRAST TECHNIQUE: Contiguous axial images were obtained from the base of the skull through the vertex without intravenous contrast. COMPARISON:  03/15/2011 FINDINGS: Skull and Sinuses:Negative for fracture or destructive process. The visualized mastoids, middle ears, and imaged paranasal sinuses are clear. Visualized orbits: Negative. Brain: No evidence of acute infarction, hemorrhage, hydrocephalus, or mass lesion/mass effect. Stable mild generalized volume loss. IMPRESSION: No acute finding or change since 2013. Electronically Signed   By: Angelica Chessman  Watts M.D.   On: 06/25/2015 01:33   I have personally reviewed and evaluated these images and lab results as part of my medical decision-making.   EKG Interpretation None     ED ECG REPORT   Date: 06/25/2015  Rate: 103  Rhythm: sinus tachycardia  QRS Axis: normal  Intervals: QT prolonged  ST/T Wave abnormalities: normal  Conduction Disutrbances:none  Narrative Interpretation:   Old EKG Reviewed: unchanged  I have personally reviewed the EKG tracing and agree with the computerized printout as noted. MDM   Final diagnoses:  Acute encephalopathy  High anion gap metabolic acidosis  Alcoholic ketoacidosis    Patient presents to the emergency department with acute mental status changes. Patient is accompanied by his brother-in-law. Brother-in-law reports that a mutual friend called him and told him that the patient was acting strangely. Patient has been agitated and very confused. He reports that he has been depressed over the fact that his son died. This apparently is not true, however.  Patient does have a history of alcoholism and cirrhosis. He denies drinking any alcohol tonight. His alcohol level was  negative. Blood work does, however, show significant anion gap acidosis. Anion gap is 24. ABG was performed and pH is 7.322. Source of the anion gap is unclear at this time. He does have elevated BUN and creatinine, but creatinine is 1.42. I doubt His confusion and acidosis is secondary to uremia/renal excretion. Salicylate level is negative. Lactic acid was normal.   Patient also at risk for toxic alcohol ingestion. He denies drinking any alcohol tonight, including flowing glycol and methanol. Empiric omeprazole administration was considered, however, beta hydroxybutyric acid has returned greater than 8 and this confirms diagnosis of alcoholic ketoacidosis.  Patient will require hospitalization for further management.  CRITICAL CARE Performed by: Orpah Greek   Total critical care time: 30 minutes  Critical care time was exclusive of separately billable procedures and treating other patients.  Critical care was necessary to treat or prevent imminent or life-threatening deterioration.  Critical care was time spent personally by me on the following activities: development of treatment plan with patient and/or surrogate as well as nursing, discussions with consultants, evaluation of patient's response to treatment, examination of patient, obtaining history from patient or surrogate, ordering and performing treatments and interventions, ordering and review of laboratory studies, ordering and review of radiographic studies, pulse oximetry and re-evaluation of patient's condition.   I personally performed the services described in this documentation, which was scribed in my presence. The recorded information has been reviewed and is accurate.    Orpah Greek, MD 06/25/15 [place], MD 06/25/15 [phone]

## 2015-06-25 NOTE — ED Notes (Signed)
Pharmacy notified on pt.'s Thiamine order.

## 2015-06-25 NOTE — H&P (Addendum)
History and Physical    Marcellus Musumeci [id number] DOB: 05-28-1965 DOA: 06/25/2015  Referring MD/NP/PA: Dr. Betsey Holiday PCP: Minerva Ends, MD  Outpatient Specialists:   Patient coming from: Home  Chief Complaint: Altered mental status  HPI: Maximino Alcorn is a 50 y.o. male with medical history significant for alcoholism, HLD, cirrhosis;  who presents for being acutely incoherent and confused with abnormal behavior. Patient is acutely altered and therefore history is mostly obtained by the patient's brother-in-law. He was found by a friend who is bringing him cigarettes, but after finding him then called the patient's brother-in-law. Patient's brother-in-law states that he was significantly confused stating that his mother was in the room when she is currently out of the country. The patient also kept stating that he was depressed because his son died. Patient brother-in-law states that his son is alive and well. He notes that the patient had similar presentation, but less severe back in 2013. Patient was noted to have lost a significant amount of weight. Patient did complain of right lower back pain. Otherwise patient denies any pain complaints of chest pain, shortness of breath, diarrhea, nausea, vomiting, or abdominal pain.  ED Course:  On admission alcohol level undetectable, troponin negative, bicarbonate 18, anion gap of 24, and beta hydroxybutyric > 8. CT scan of the brain showed no acute abnormalities when compared to 2013. Arterial ABG ph 7.322, PCO2 28.5, and PCO2 93.  Review of Systems: As per HPI otherwise 10 point review of systems negative.    Past Medical History  Diagnosis Date  . Alcoholism ([place])   . Hyperlipemia   . Cirrhosis [facility])     Past Surgical History  Procedure Laterality Date  . No past surgeries       reports that he has been smoking Cigarettes.  He has been smoking about 0.50 packs per day. He has never used smokeless tobacco. He reports that he  drinks alcohol. He reports that he does not use illicit drugs.  No Known Allergies  Family History  Problem Relation Age of Onset  . Coronary artery disease    . Diabetes type II      Prior to Admission medications   Medication Sig Start Date End Date Taking? Authorizing Provider  acetaminophen-codeine (TYLENOL #3) 300-30 MG tablet Take 1 tablet by mouth every 8 (eight) hours as needed for moderate pain. 06/22/15   Josalyn Funches, MD  zolpidem (AMBIEN) 10 MG tablet Take 1 tablet (10 mg total) by mouth at bedtime as needed for sleep. 06/22/15   Boykin Nearing, MD    Physical Exam: Filed Vitals:   06/25/15 0245 06/25/15 0300 06/25/15 0313 06/25/15 0315  BP: 124/80 130/91 130/91 132/88  Pulse: 97 103 96 92  Temp:      TempSrc:      Resp:      Weight:      SpO2: 100% 100%  97%      Constitutional: In mild distress, disheveled appearance, restless  Filed Vitals:   06/25/15 0245 06/25/15 0300 06/25/15 0313 06/25/15 0315  BP: 124/80 130/91 130/91 132/88  Pulse: 97 103 96 92  Temp:      TempSrc:      Resp:      Weight:      SpO2: 100% 100%  97%   Eyes: PERRL, lids and conjunctivae normal ENMT: Mucous membranes are moist. Posterior pharynx clear of any exudate or lesions.Normal dentition.  Neck: normal, supple, no masses, no thyromegaly Respiratory: clear to auscultation bilaterally, no  wheezing, no crackles. Normal respiratory effort. No accessory muscle use.  Cardiovascular: Regular rate and rhythm, no murmurs / rubs / gallops. No extremity edema. 2+ pedal pulses. No carotid bruits.  Abdomen: no tenderness, no masses palpated. No hepatosplenomegaly. Bowel sounds positive.  Musculoskeletal: no clubbing / cyanosis. No joint deformity upper and lower extremities. Good ROM, no contractures. Normal muscle tone.  Skin: no rashes, lesions, ulcers. No induration Neurologic: CN 2-12 grossly intact. Sensation intact, DTR normal. Strength 4+/5 in all 4. Tremulous Psychiatric: Confused  with visual hallucinations    Labs on Admission: I have personally reviewed following labs and imaging studies  CBC:  Recent Labs Lab 06/24/15 2018 06/25/15 0043  WBC 13.5*  --   NEUTROABS  --  PENDING  HGB 15.2  --   HCT 45.6  --   MCV 93.1  --   PLT 199  --    Basic Metabolic Panel:  Recent Labs Lab 06/24/15 2018 06/25/15 0033  NA 134*  --   K 3.9  --   CL 92*  --   CO2 18*  --   GLUCOSE 79  --   BUN 24*  --   CREATININE 1.42*  --   CALCIUM 9.7  --   MG  --  2.1  PHOS  --  4.8*   GFR: Estimated Creatinine Clearance: 48.5 mL/min (by C-G formula based on Cr of 1.42). Liver Function Tests:  Recent Labs Lab 06/24/15 2018  AST 77*  ALT 54  ALKPHOS 131*  BILITOT 2.3*  PROT 8.5*  ALBUMIN 4.5   No results for input(s): LIPASE, AMYLASE in the last 168 hours.  Recent Labs Lab 06/24/15 2018  AMMONIA 49*   Coagulation Profile:  Recent Labs Lab 06/25/15 0043  INR 1.04   Cardiac Enzymes:  Recent Labs Lab 06/25/15 0043  CKTOTAL 209  TROPONINI <0.03   BNP (last 3 results) No results for input(s): PROBNP in the last 8760 hours. HbA1C: No results for input(s): HGBA1C in the last 72 hours. CBG:  Recent Labs Lab 06/25/15 0039  GLUCAP 82   Lipid Profile: No results for input(s): CHOL, HDL, LDLCALC, TRIG, CHOLHDL, LDLDIRECT in the last 72 hours. Thyroid Function Tests: No results for input(s): TSH, T4TOTAL, FREET4, T3FREE, THYROIDAB in the last 72 hours. Anemia Panel: No results for input(s): VITAMINB12, FOLATE, FERRITIN, TIBC, IRON, RETICCTPCT in the last 72 hours. Urine analysis:    Component Value Date/Time   COLORURINE YELLOW 06/24/2015 2016   APPEARANCEUR CLEAR 06/24/2015 2016   LABSPEC 1.003* 06/24/2015 2016   PHURINE 5.5 06/24/2015 2016   GLUCOSEU NEGATIVE 06/24/2015 2016   HGBUR NEGATIVE 06/24/2015 2016   BILIRUBINUR NEGATIVE 06/24/2015 2016   KETONESUR NEGATIVE 06/24/2015 2016   PROTEINUR NEGATIVE 06/24/2015 2016   UROBILINOGEN  1.0 10/20/2014 1026   NITRITE NEGATIVE 06/24/2015 2016   LEUKOCYTESUR NEGATIVE 06/24/2015 2016   Sepsis Labs: @LABRCNTIP (procalcitonin:4,lacticidven:4) )No results found for this or any previous visit (from the past 240 hour(s)).   Radiological Exams on Admission: Dg Chest 2 View  06/25/2015  CLINICAL DATA:  Acute onset of altered mental status. Initial encounter. EXAM: CHEST  2 VIEW COMPARISON:  Chest radiograph performed 03/16/2011 FINDINGS: The lungs are well-aerated. Mild peribronchial thickening is noted. Minimal bibasilar atelectasis or scarring is seen. There is no evidence of pleural effusion or pneumothorax. The heart is normal in size; the mediastinal contour is within normal limits. No acute osseous abnormalities are seen. IMPRESSION: Mild peribronchial thickening noted. Minimal bibasilar atelectasis or scarring  seen. Electronically Signed   By: Garald Balding M.D.   On: 06/25/2015 01:13   Ct Head Wo Contrast  06/25/2015  CLINICAL DATA:  Mental status change EXAM: CT HEAD WITHOUT CONTRAST TECHNIQUE: Contiguous axial images were obtained from the base of the skull through the vertex without intravenous contrast. COMPARISON:  03/15/2011 FINDINGS: Skull and Sinuses:Negative for fracture or destructive process. The visualized mastoids, middle ears, and imaged paranasal sinuses are clear. Visualized orbits: Negative. Brain: No evidence of acute infarction, hemorrhage, hydrocephalus, or mass lesion/mass effect. Stable mild generalized volume loss. IMPRESSION: No acute finding or change since 2013. Electronically Signed   By: Monte Fantasia M.D.   On: 06/25/2015 01:33    EKG: Independently reviewed. 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 On admission alcohol level undetectable, AG of 24, and  beta hydroxybutyric > 8. - Admit to stepdown - CWIAA protocols - Seizure precautions - Thiamine 500 mg IV tid x 2 days for suspected wernicke's encephalopathy - Repeat BMP in am - Continue to monitor magnesium and phosphate levels and replace as needed - social work consult - Started on a regular diet monitor for signs of refeeding syndrome  Leukocytosis: WBC elevated at 13.5 on admission. Likely reactive as UA and chest x-ray showed no acute signs of infection. 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

## 2015-06-25 NOTE — ED Notes (Signed)
Attempted report 

## 2015-06-26 LAB — COMPREHENSIVE METABOLIC PANEL WITH GFR
ALT: 40 U/L (ref 17–63)
AST: 51 U/L — ABNORMAL HIGH (ref 15–41)
Albumin: 2.9 g/dL — ABNORMAL LOW (ref 3.5–5.0)
Alkaline Phosphatase: 84 U/L (ref 38–126)
Anion gap: 8 (ref 5–15)
BUN: <5 mg/dL — ABNORMAL LOW (ref 6–20)
CO2: 25 mmol/L (ref 22–32)
Calcium: 8.2 mg/dL — ABNORMAL LOW (ref 8.9–10.3)
Chloride: 104 mmol/L (ref 101–111)
Creatinine, Ser: 0.57 mg/dL — ABNORMAL LOW (ref 0.61–1.24)
GFR calc Af Amer: >60 mL/min
GFR calc non Af Amer: >60 mL/min
Glucose, Bld: 138 mg/dL — ABNORMAL HIGH (ref 65–99)
Potassium: 2.8 mmol/L — ABNORMAL LOW (ref 3.5–5.1)
Sodium: 137 mmol/L (ref 135–145)
Total Bilirubin: 1 mg/dL (ref 0.3–1.2)
Total Protein: 5.8 g/dL — ABNORMAL LOW (ref 6.5–8.1)

## 2015-06-26 LAB — CBC
HCT: 40.1 % (ref 39.0–52.0)
Hemoglobin: 13.2 g/dL (ref 13.0–17.0)
MCH: 31 pg (ref 26.0–34.0)
MCHC: 32.9 g/dL (ref 30.0–36.0)
MCV: 94.1 fL (ref 78.0–100.0)
Platelets: 142 K/uL — ABNORMAL LOW (ref 150–400)
RBC: 4.26 MIL/uL (ref 4.22–5.81)
RDW: 13.8 % (ref 11.5–15.5)
WBC: 6.1 K/uL (ref 4.0–10.5)

## 2015-06-26 LAB — MAGNESIUM: Magnesium: 1.8 mg/dL (ref 1.7–2.4)

## 2015-06-26 MED ORDER — CHLORDIAZEPOXIDE HCL 5 MG PO CAPS
5.0000 mg | ORAL_CAPSULE | Freq: Three times a day (TID) | ORAL | Status: DC
  Administered 2015-06-26 – 2015-06-27 (×2): 5 mg via ORAL
  Filled 2015-06-26 (×3): qty 1

## 2015-06-26 MED ORDER — POTASSIUM CHLORIDE 10 MEQ/100ML IV SOLN
10.0000 meq | INTRAVENOUS | Status: AC
  Administered 2015-06-26 (×4): 10 meq via INTRAVENOUS
  Filled 2015-06-26 (×4): qty 100

## 2015-06-26 MED ORDER — ACETAMINOPHEN 500 MG PO TABS
500.0000 mg | ORAL_TABLET | Freq: Four times a day (QID) | ORAL | Status: DC | PRN
  Administered 2015-06-26 (×2): 500 mg via ORAL
  Filled 2015-06-26 (×2): qty 1

## 2015-06-26 MED ORDER — MAGNESIUM SULFATE IN D5W 1-5 GM/100ML-% IV SOLN
1.0000 g | Freq: Once | INTRAVENOUS | Status: AC
  Administered 2015-06-26: 1 g via INTRAVENOUS
  Filled 2015-06-26: qty 100

## 2015-06-26 MED ORDER — DEXTROSE-NACL 5-0.45 % IV SOLN: INTRAVENOUS | Status: DC

## 2015-06-26 MED ORDER — POTASSIUM CHLORIDE CRYS ER 20 MEQ PO TBCR
40.0000 meq | EXTENDED_RELEASE_TABLET | ORAL | Status: AC
  Administered 2015-06-26 (×2): 40 meq via ORAL
  Filled 2015-06-26 (×2): qty 2

## 2015-06-26 MED ORDER — LACTULOSE 10 GM/15ML PO SOLN
10.0000 g | Freq: Two times a day (BID) | ORAL | Status: DC
  Administered 2015-06-26: 10 g via ORAL
  Filled 2015-06-26 (×2): qty 15

## 2015-06-26 MED ORDER — VITAMIN B-1 100 MG PO TABS
100.0000 mg | ORAL_TABLET | Freq: Every day | ORAL | Status: DC
  Administered 2015-06-26 – 2015-06-27 (×2): 100 mg via ORAL
  Filled 2015-06-26 (×2): qty 1

## 2015-06-26 NOTE — Progress Notes (Signed)
Librium ordered 3x daily for Pt. Med not loaded in our Pyxis. Pharmacy messaged through [facility]. Pharmacy is aware, called and talked over phone. Per Pharm, will load pyxis as soon as possible. Pt is stable. Will continue to monitor.

## 2015-06-26 NOTE — Progress Notes (Addendum)
PROGRESS NOTE                                                                                                                                                                                                             Patient Demographics:    [name], is a 50 y.o. male, DOB - 08-10-1965, LU:[id number]  Admit date - 06/25/2015   Admitting Physician Norval Morton, MD  Outpatient Primary MD for the patient is Minerva Ends, MD  LOS - 1  Outpatient Specialists: [facility]  Chief Complaint  Patient presents with  . Altered Mental Status       Brief Narrative    [name] is a 50 y.o. male with medical history significant for alcoholism, HLD, cirrhosis; who presents for being acutely incoherent and confused with abnormal behavior. Patient is acutely altered and therefore history is mostly obtained by the patient's brother-in-law. He was found by a friend who is bringing him cigarettes, but after finding him then called the patient's brother-in-law. Patient's brother-in-law states that he was significantly confused stating that his mother was in the room when she is currently out of the country. The patient also kept stating that he was depressed because his son died. Patient brother-in-law states that his son is alive and well. He notes that the patient had similar presentation, but less severe back in 2013. Patient was noted to have lost a significant amount of weight. Patient did complain of right lower back pain. Otherwise patient denies any pain complaints of chest pain, shortness of breath, diarrhea, nausea, vomiting, or abdominal pain.  ED Course: On admission alcohol level undetectable, troponin negative, bicarbonate 18, anion gap of 24, and beta hydroxybutyric > 8. CT scan of the brain showed no acute abnormalities when compared to 2013. Arterial ABG ph 7.322, PCO2 28.5, and PCO2 93.     Subjective:    [name] today is In bed, denies any headache, no chest or abdominal pain, no shortness of breath or weakness.   Assessment  & Plan :     1. Acute Encephalopathy - likely due to alcohol withdrawal along with combination of narcotics and Benadryl use, ammonia was top normal. Head CT unremarkable, abdominal ultrasound revealed no ascites, placed on lactulose, minimize narcotics and benzodiazepines, is close to his baseline, no signs of active DTs, will  place on scheduled Librium and monitor, continue low-dose lactulose, counseled to quit alcohol and minimize narcotic and Benadryl use.  2. Metabolic acidosis. High anion gap, due to alcoholic ketosis with high beta hydroxybutyrate. Resolved with hydration.  3. Alcohol abuse. Consult to quit, continue folic acid and thiamine, no nystagmus, gait is normal, no signs of Wernicke's encephalopathy.    4. Hypokalemia. Replaced.  5. DM type II. Not on any medications at home, stable A1c, question diagnosis.   Lab Results  Component Value Date   HGBA1C 5.1 10/23/2014   CBG (last 3)   Recent Labs  06/25/15 0039  GLUCAP 82     Code Status : Full  Family Communication  : Sister over the phone  Disposition Plan  : HomeIn the morning, advance activity  Barriers For Discharge : Encephalopathy  Consults  : None  Procedures  :   CT Head - Non acute   US Paracentesis -  ultrasound revealed no fluid  DVT Prophylaxis  :  Lovenox   Lab Results  Component Value Date   PLT 142* 06/26/2015    Antibiotics  :     Anti-infectives    Start     Dose/Rate Route Frequency Ordered Stop   06/26/15 1215  cefTRIAXone (ROCEPHIN) 2 g in dextrose 5 % 50 mL IVPB  Status:  Discontinued     2 g 100 mL/hr over 30 Minutes Intravenous Every 24 hours 06/25/15 1828 06/26/15 1000   06/25/15 1215  cefTRIAXone (ROCEPHIN) 1 g in dextrose 5 % 50 mL IVPB  Status:  Discontinued     1 g 100 mL/hr over 30 Minutes Intravenous Every 24  hours 06/25/15 1212 06/25/15 1828        Objective:   Filed Vitals:   06/25/15 1829 06/25/15 2145 06/26/15 0500 06/26/15 0604  BP: 100/73 109/75  113/70  Pulse: 87 73  87  Temp: 98.3 F (36.8 C) 97.8 F (36.6 C)  98.3 F (36.8 C)  TempSrc: Oral Oral  Oral  Resp:  18  18  Height:   5\' 9"  (1.753 m)   Weight:   55.74 kg (122 lb 14.2 oz)   SpO2: 99% 100%  100%    Wt Readings from Last 3 Encounters:  06/26/15 55.74 kg (122 lb 14.2 oz)  03/15/15 61.689 kg (136 lb)  12/29/14 60.328 kg (133 lb)     Intake/Output Summary (Last 24 hours) at 06/26/15 1210 Last data filed at 06/26/15 0500  Gross per 24 hour  Intake 1601.67 ml  Output    300 ml  Net 1301.67 ml     Physical Exam  Awake Alert, Oriented X 3, No new F.N deficits, Normal affect [name],PERRAL Supple Neck,No JVD, No cervical lymphadenopathy appriciated.  Symmetrical Chest wall movement, Good air movement bilaterally, CTAB RRR,No Gallops,Rubs or new Murmurs, No Parasternal Heave +ve B.Sounds, Abd Soft - mild ascites, No tenderness, No organomegaly appriciated, No rebound - guarding or rigidity. No Cyanosis, Clubbing or edema, No new Rash or bruise    Data Review:    CBC  Recent Labs Lab 06/24/15 2018 06/25/15 0043 06/25/15 0510 06/26/15 0604  WBC 13.5*  --  8.4 6.1  HGB 15.2  --  13.0 13.2  HCT 45.6  --  38.8* 40.1  PLT 199  --  168 142*  MCV 93.1  --  91.7 94.1  MCH 31.0  --  30.7 31.0  MCHC 33.3  --  33.5 32.9  RDW 14.0  --  13.9  13.8  LYMPHSABS  --  3.5  --   --   MONOABS  --  1.9*  --   --   EOSABS  --  0.1  --   --   BASOSABS  --  0.0  --   --     Chemistries   Recent Labs Lab 06/24/15 2018 06/25/15 0033 06/25/15 0915 06/26/15 0604  NA 134*  --  137 137  K 3.9  --  3.1* 2.8*  CL 92*  --  102 104  CO2 18*  --  20* 25  GLUCOSE 79  --  80 138*  BUN 24*  --  15 <5*  CREATININE 1.42*  --  0.85 0.57*  CALCIUM 9.7  --  8.1* 8.2*  MG  --  2.1  --  1.8  AST 77*  --   --  51*  ALT 54   --   --  40  ALKPHOS 131*  --   --  84  BILITOT 2.3*  --   --  1.0   ------------------------------------------------------------------------------------------------------------------ No results for input(s): CHOL, HDL, LDLCALC, TRIG, CHOLHDL, LDLDIRECT in the last 72 hours.  Lab Results  Component Value Date   HGBA1C 5.1 10/23/2014   ------------------------------------------------------------------------------------------------------------------ No results for input(s): TSH, T4TOTAL, T3FREE, THYROIDAB in the last 72 hours.  Invalid input(s): FREET3 ------------------------------------------------------------------------------------------------------------------ No results for input(s): VITAMINB12, FOLATE, FERRITIN, TIBC, IRON, RETICCTPCT in the last 72 hours.  Coagulation profile  Recent Labs Lab 06/25/15 0043  INR 1.04    No results for input(s): DDIMER in the last 72 hours.  Cardiac Enzymes  Recent Labs Lab 06/25/15 0043  TROPONINI <0.03   ------------------------------------------------------------------------------------------------------------------ No results found for: BNP  Inpatient Medications  Scheduled Meds: . chlordiazePOXIDE  5 mg Oral TID  . enoxaparin (LOVENOX) injection  30 mg Subcutaneous Q24H  . folic acid  1 mg Oral Daily  . lactulose  10 g Oral BID  . multivitamin with minerals  1 tablet Oral Daily  . potassium chloride  40 mEq Oral Q4H  . thiamine  100 mg Oral Daily   Continuous Infusions:   PRN Meds:.acetaminophen, [DISCONTINUED] ondansetron **OR** ondansetron (ZOFRAN) IV  Micro Results Recent Results (from the past 240 hour(s))  Culture, blood (Routine X 2) w Reflex to ID Panel     Status: None (Preliminary result)   Collection Time: 06/25/15 12:43 AM  Result Value Ref Range Status   Specimen Description BLOOD RIGHT ARM  Final   Special Requests BOTTLES DRAWN AEROBIC AND ANAEROBIC 5ML  Final   Culture NO GROWTH 1 DAY  Final    Report Status PENDING  Incomplete  Culture, blood (Routine X 2) w Reflex to ID Panel     Status: None (Preliminary result)   Collection Time: 06/25/15 12:53 AM  Result Value Ref Range Status   Specimen Description BLOOD RIGHT ARM  Final   Special Requests IN PEDIATRIC BOTTLE 4ML  Final   Culture NO GROWTH 1 DAY  Final   Report Status PENDING  Incomplete    Radiology Reports Dg Chest 2 View  06/25/2015  CLINICAL DATA:  Acute onset of altered mental status. Initial encounter. EXAM: CHEST  2 VIEW COMPARISON:  Chest radiograph performed 03/16/2011 FINDINGS: The lungs are well-aerated. Mild peribronchial thickening is noted. Minimal bibasilar atelectasis or scarring is seen. There is no evidence of pleural effusion or pneumothorax. The heart is normal in size; the mediastinal contour is within normal limits. No acute osseous abnormalities are seen. IMPRESSION:  Mild peribronchial thickening noted. Minimal bibasilar atelectasis or scarring seen. Electronically Signed   By: Garald Balding M.D.   On: 06/25/2015 01:13   Ct Head Wo Contrast  06/25/2015  CLINICAL DATA:  Mental status change EXAM: CT HEAD WITHOUT CONTRAST TECHNIQUE: Contiguous axial images were obtained from the base of the skull through the vertex without intravenous contrast. COMPARISON:  03/15/2011 FINDINGS: Skull and Sinuses:Negative for fracture or destructive process. The visualized mastoids, middle ears, and imaged paranasal sinuses are clear. Visualized orbits: Negative. Brain: No evidence of acute infarction, hemorrhage, hydrocephalus, or mass lesion/mass effect. Stable mild generalized volume loss. IMPRESSION: No acute finding or change since 2013. Electronically Signed   By: Monte Fantasia M.D.   On: 06/25/2015 01:33   US Abdomen Limited  06/25/2015  CLINICAL DATA:  Encephalopathy. History of alcoholic cirrhosis. Request paracentesis. EXAM: LIMITED ABDOMINAL ULTRASOUND COMPARISON:  None. FINDINGS: Limited ultrasound of the abdomen  finds no evidence of ascites. Patient does have a significantly distended and full urinary bladder. No ascites amenable for paracentesis. IMPRESSION: No evident ascites. Read by: Joretta Bachelor PA-C Electronically Signed   By: Marybelle Killings M.D.   On: 06/25/2015 14:55    Time Spent in minutes  30   Landree Fernholz K M.D on 06/26/2015 at 12:10 PM  Between 7am to 7pm - Pager - [phone]  After 7pm go to [email or web address] - password [facility]  [facility] -  Office  [phone]

## 2015-06-27 LAB — BASIC METABOLIC PANEL
ANION GAP: 8 (ref 5–15)
BUN: <5 mg/dL — ABNORMAL LOW (ref 6–20)
CALCIUM: 8.9 mg/dL (ref 8.9–10.3)
CO2: 27 mmol/L (ref 22–32)
Chloride: 102 mmol/L (ref 101–111)
Creatinine, Ser: 0.49 mg/dL — ABNORMAL LOW (ref 0.61–1.24)
GLUCOSE: 96 mg/dL (ref 65–99)
Potassium: 3.5 mmol/L (ref 3.5–5.1)
Sodium: 137 mmol/L (ref 135–145)

## 2015-06-27 MED ORDER — POTASSIUM CHLORIDE CRYS ER 20 MEQ PO TBCR
40.0000 meq | EXTENDED_RELEASE_TABLET | Freq: Once | ORAL | Status: AC
  Administered 2015-06-27: 40 meq via ORAL
  Filled 2015-06-27: qty 2

## 2015-06-27 MED ORDER — FOLIC ACID 1 MG PO TABS: 1.0000 mg | ORAL_TABLET | Freq: Every day | ORAL | Status: DC

## 2015-06-27 MED ORDER — THIAMINE HCL 100 MG PO TABS: 100.0000 mg | ORAL_TABLET | Freq: Every day | ORAL | Status: DC

## 2015-06-27 MED ORDER — CHLORDIAZEPOXIDE HCL 5 MG PO CAPS: ORAL_CAPSULE | ORAL | Status: DC

## 2015-06-27 MED ORDER — LACTULOSE 10 GM/15ML PO SOLN: 20.0000 g | Freq: Every day | ORAL | Status: DC

## 2015-06-27 NOTE — Progress Notes (Signed)
Pt given discharge instructions, prescriptions, and care notes. Pt verbalized understanding AEB no further questions or concerns at this time. IV was discontinued, no redness, pain, or swelling noted at this time. Telemetry discontinued and Centralized Telemetry was notified. Pt left the floor via wheelchair with staff in stable condition. 

## 2015-06-27 NOTE — Discharge Instructions (Signed)
Follow with Primary MD Minerva Ends, MD in 3-4 days   Get CBC, CMP, 2 view Chest X ray checked  by Primary MD next visit.    Activity: As tolerated with Full fall precautions use walker/cane & assistance as needed   Disposition Home     Diet:   Heart Healthy   For Heart failure patients - Check your Weight same time everyday, if you gain over 2 pounds, or you develop in leg swelling, experience more shortness of breath or chest pain, call your Primary MD immediately. Follow Cardiac Low Salt Diet and 1.5 lit/day fluid restriction.   On your next visit with your primary care physician please Get Medicines reviewed and adjusted.   Please request your Prim.MD to go over all Hospital Tests and Procedure/Radiological results at the follow up, please get all Hospital records sent to your Prim MD by signing hospital release before you go home.   If you experience worsening of your admission symptoms, develop shortness of breath, life threatening emergency, suicidal or homicidal thoughts you must seek medical attention immediately by calling 911 or calling your MD immediately  if symptoms less severe.  You Must read complete instructions/literature along with all the possible adverse reactions/side effects for all the Medicines you take and that have been prescribed to you. Take any new Medicines after you have completely understood and accpet all the possible adverse reactions/side effects.   Do not drive, operating heavy machinery, perform activities at heights, swimming or participation in water activities or provide baby sitting services if your were admitted for syncope or siezures until you have seen by Primary MD or a Neurologist and advised to do so again.  Do not drive when taking Pain medications.    Do not take more than prescribed Pain, Sleep and Anxiety Medications  Special Instructions: If you have smoked or chewed Tobacco  in the last 2 yrs please stop smoking, stop any  regular Alcohol  and or any Recreational drug use.  Wear Seat belts while driving.   Please note  You were cared for by a hospitalist during your hospital stay. If you have any questions about your discharge medications or the care you received while you were in the hospital after you are discharged, you can call the unit and asked to speak with the hospitalist on call if the hospitalist that took care of you is not available. Once you are discharged, your primary care physician will handle any further medical issues. Please note that NO REFILLS for any discharge medications will be authorized once you are discharged, as it is imperative that you return to your primary care physician (or establish a relationship with a primary care physician if you do not have one) for your aftercare needs so that they can reassess your need for medications and monitor your lab values.

## 2015-06-27 NOTE — Discharge Summary (Signed)
[name], is a 50 y.o. male  DOB 1965/09/12  MRN [id number].  Admission date:  06/25/2015  Admitting Physician  Norval Morton, MD  Discharge Date:  06/27/2015   Primary MD  Minerva Ends, MD  Recommendations for primary care physician for things to follow:   Check CBC, CMP in a week. Monitor alcohol abuse, avoid prescribing narcotics and benzodiazepines.   Admission Diagnosis  Alcoholic ketoacidosis 99991111 Encephalopathy [G93.40] High anion gap metabolic acidosis 99991111 Acute encephalopathy [G93.40]   Discharge Diagnosis  Alcoholic ketoacidosis 99991111 Encephalopathy [G93.40] High anion gap metabolic acidosis 99991111 Acute encephalopathy [G93.40]     Principal Problem:   Alcoholic ketoacidosis Active Problems:   Tobacco abuse   Liver cirrhosis (HCC)   Wernicke's encephalopathy   Encephalopathy      Past Medical History  Diagnosis Date  . Alcoholism ([place])   . Hyperlipemia   . Cirrhosis [facility])     Past Surgical History  Procedure Laterality Date  . No past surgeries         HPI  from the history and physical done on the day of admission:    [name] is a 50 y.o. male with medical history significant for alcoholism, HLD, cirrhosis; who presents for being acutely incoherent and confused with abnormal behavior. Patient is acutely altered and therefore history is mostly obtained by the patient's brother-in-law. He was found by a friend who is bringing him cigarettes, but after finding him then called the patient's brother-in-law. Patient's brother-in-law states that he was significantly confused stating that his mother was in the room when she is currently out of the country. The patient also kept stating that he was depressed because his son died. Patient brother-in-law  states that his son is alive and well. He notes that the patient had similar presentation, but less severe back in 2013. Patient was noted to have lost a significant amount of weight. Patient did complain of right lower back pain. Otherwise patient denies any pain complaints of chest pain, shortness of breath, diarrhea, nausea, vomiting, or abdominal pain.  ED Course: On admission alcohol level undetectable, troponin negative, bicarbonate 18, anion gap of 24, and beta hydroxybutyric > 8. CT scan of the brain showed no acute abnormalities when compared to 2013. Arterial ABG ph 7.322, PCO2 28.5, and PCO2 93.     Hospital Course:     1. Acute Encephalopathy - likely due to alcohol withdrawal along with combination of narcotics and Benadryl use, ammonia was top normal. Head CT unremarkable, abdominal ultrasound revealed no ascites, placed on lactulose, held narcotics and Benadryl, he is now at his baseline, no signs of active DTs, will place on scheduled Librium taper and discharge, will place on low-dose lactulose as well.  Patient extensively counseled on not abusing alcohol, to avoid using Benadryl and narcotics. He understands and agrees with the plan. Discussed with sister on the day of admission and yesterday.  2. Metabolic acidosis. High anion gap, due to alcoholic ketosis with high beta hydroxybutyrate. Resolved with  hydration.  3. Alcohol abuse. He was counseled to quit, continue folic acid and thiamine, no nystagmus, gait is normal, no signs of Wernicke's encephalopathy. Placed on Librium taper at the time of discharge. He does not want any referral to AA on any formal help at this time.  4. Hypokalemia. Replaced and stable.  5. ? DM type II. Not on any medications at home, stable A1c, question diagnosis.   Lab Results  Component Value Date   HGBA1C 5.1 10/23/2014      Follow UP  Follow-up Information    Follow up with Minerva Ends, MD. Schedule an appointment as soon as  possible for a visit in 3 days.   Specialty:  Family Medicine   Contact information:   [place] [address] [phone]        Consults obtained - None  Discharge Condition: Stable  Diet and Activity recommendation: See Discharge Instructions below  Discharge Instructions       Discharge Instructions    Diet - low sodium heart healthy    Complete by:  As directed      Discharge instructions    Complete by:  As directed   Follow with Primary MD Minerva Ends, MD in 3-4 days   Get CBC, CMP, 2 view Chest X ray checked  by Primary MD next visit.    Activity: As tolerated with Full fall precautions use walker/cane & assistance as needed   Disposition Home     Diet:   Heart Healthy   For Heart failure patients - Check your Weight same time everyday, if you gain over 2 pounds, or you develop in leg swelling, experience more shortness of breath or chest pain, call your Primary MD immediately. Follow Cardiac Low Salt Diet and 1.5 lit/day fluid restriction.   On your next visit with your primary care physician please Get Medicines reviewed and adjusted.   Please request your Prim.MD to go over all Hospital Tests and Procedure/Radiological results at the follow up, please get all Hospital records sent to your Prim MD by signing hospital release before you go home.   If you experience worsening of your admission symptoms, develop shortness of breath, life threatening emergency, suicidal or homicidal thoughts you must seek medical attention immediately by calling 911 or calling your MD immediately  if symptoms less severe.  You Must read complete instructions/literature along with all the possible adverse reactions/side effects for all the Medicines you take and that have been prescribed to you. Take any new Medicines after you have completely understood and accpet all the possible adverse reactions/side effects.   Do not drive, operating heavy machinery,  perform activities at heights, swimming or participation in water activities or provide baby sitting services if your were admitted for syncope or siezures until you have seen by Primary MD or a Neurologist and advised to do so again.  Do not drive when taking Pain medications.    Do not take more than prescribed Pain, Sleep and Anxiety Medications  Special Instructions: If you have smoked or chewed Tobacco  in the last 2 yrs please stop smoking, stop any regular Alcohol  and or any Recreational drug use.  Wear Seat belts while driving.   Please note  You were cared for by a hospitalist during your hospital stay. If you have any questions about your discharge medications or the care you received while you were in the hospital after you are discharged, you can call the  unit and asked to speak with the hospitalist on call if the hospitalist that took care of you is not available. Once you are discharged, your primary care physician will handle any further medical issues. Please note that NO REFILLS for any discharge medications will be authorized once you are discharged, as it is imperative that you return to your primary care physician (or establish a relationship with a primary care physician if you do not have one) for your aftercare needs so that they can reassess your need for medications and monitor your lab values.     Increase activity slowly    Complete by:  As directed              Discharge Medications       Medication List    STOP taking these medications        acetaminophen-codeine 300-30 MG tablet  Commonly known as:  TYLENOL #3     diphenhydrAMINE 25 mg capsule  Commonly known as:  BENADRYL     zolpidem 10 MG tablet  Commonly known as:  AMBIEN      TAKE these medications        chlordiazePOXIDE 5 MG capsule  Commonly known as:  LIBRIUM  Please dispense 18 pills - Take 1 pill three times a day for 3 days, then Take 1 pill two times a day for 3 days, then Take  1 pill once a day for 3 days and stop.     folic acid 1 MG tablet  Commonly known as:  FOLVITE  Take 1 tablet (1 mg total) by mouth daily.     lactulose 10 GM/15ML solution  Commonly known as:  CHRONULAC  Take 30 mLs (20 g total) by mouth daily.     thiamine 100 MG tablet  Take 1 tablet (100 mg total) by mouth daily.        Major procedures and Radiology Reports - PLEASE review detailed and final reports for all details, in brief -      Dg Chest 2 View  06/25/2015  CLINICAL DATA:  Acute onset of altered mental status. Initial encounter. EXAM: CHEST  2 VIEW COMPARISON:  Chest radiograph performed 03/16/2011 FINDINGS: The lungs are well-aerated. Mild peribronchial thickening is noted. Minimal bibasilar atelectasis or scarring is seen. There is no evidence of pleural effusion or pneumothorax. The heart is normal in size; the mediastinal contour is within normal limits. No acute osseous abnormalities are seen. IMPRESSION: Mild peribronchial thickening noted. Minimal bibasilar atelectasis or scarring seen. Electronically Signed   By: Garald Balding M.D.   On: 06/25/2015 01:13   Ct Head Wo Contrast  06/25/2015  CLINICAL DATA:  Mental status change EXAM: CT HEAD WITHOUT CONTRAST TECHNIQUE: Contiguous axial images were obtained from the base of the skull through the vertex without intravenous contrast. COMPARISON:  03/15/2011 FINDINGS: Skull and Sinuses:Negative for fracture or destructive process. The visualized mastoids, middle ears, and imaged paranasal sinuses are clear. Visualized orbits: Negative. Brain: No evidence of acute infarction, hemorrhage, hydrocephalus, or mass lesion/mass effect. Stable mild generalized volume loss. IMPRESSION: No acute finding or change since 2013. Electronically Signed   By: Monte Fantasia M.D.   On: 06/25/2015 01:33   US Abdomen Limited  06/25/2015  CLINICAL DATA:  Encephalopathy. History of alcoholic cirrhosis. Request paracentesis. EXAM: LIMITED ABDOMINAL  ULTRASOUND COMPARISON:  None. FINDINGS: Limited ultrasound of the abdomen finds no evidence of ascites. Patient does have a significantly distended and full urinary bladder. No ascites  amenable for paracentesis. IMPRESSION: No evident ascites. Read by: Joretta Bachelor PA-C Electronically Signed   By: Marybelle Killings M.D.   On: 06/25/2015 14:55    Micro Results      Recent Results (from the past 240 hour(s))  Culture, blood (Routine X 2) w Reflex to ID Panel     Status: None (Preliminary result)   Collection Time: 06/25/15 12:43 AM  Result Value Ref Range Status   Specimen Description BLOOD RIGHT ARM  Final   Special Requests BOTTLES DRAWN AEROBIC AND ANAEROBIC 5ML  Final   Culture NO GROWTH 1 DAY  Final   Report Status PENDING  Incomplete  Culture, blood (Routine X 2) w Reflex to ID Panel     Status: None (Preliminary result)   Collection Time: 06/25/15 12:53 AM  Result Value Ref Range Status   Specimen Description BLOOD RIGHT ARM  Final   Special Requests IN PEDIATRIC BOTTLE 4ML  Final   Culture NO GROWTH 1 DAY  Final   Report Status PENDING  Incomplete       Today   Subjective    [name] today has no headache,no chest abdominal pain,no new weakness tingling or numbness, feels much better wants to go home today.    Objective   Blood pressure 129/88, pulse 82, temperature 98.3 F (36.8 C), temperature source Oral, resp. rate 16, height 5\' 9"  (1.753 m), weight 55.74 kg (122 lb 14.2 oz), SpO2 100 %.   Intake/Output Summary (Last 24 hours) at 06/27/15 0951 Last data filed at 06/27/15 [id number]  Gross per 24 hour  Intake    220 ml  Output      0 ml  Net    220 ml    Exam Awake Alert, Oriented x 3, No new F.N deficits, Normal affect [place].AT,PERRAL Supple Neck,No JVD, No cervical lymphadenopathy appriciated.  Symmetrical Chest wall movement, Good air movement bilaterally, CTAB RRR,No Gallops,Rubs or new Murmurs, No Parasternal Heave +ve B.Sounds, Abd Soft, Non tender,  No organomegaly appriciated, No rebound -guarding or rigidity. No Cyanosis, Clubbing or edema, No new Rash or bruise   Data Review   CBC w Diff: Lab Results  Component Value Date   WBC 6.1 06/26/2015   HGB 13.2 06/26/2015   HCT 40.1 06/26/2015   PLT 142* 06/26/2015   LYMPHOPCT 29 06/25/2015   MONOPCT 16 06/25/2015   EOSPCT 1 06/25/2015   BASOPCT 0 06/25/2015    CMP: Lab Results  Component Value Date   NA 137 06/27/2015   K 3.5 06/27/2015   CL 102 06/27/2015   CO2 27 06/27/2015   BUN <5* 06/27/2015   CREATININE 0.49* 06/27/2015   CREATININE 0.62* 11/26/2014   PROT 5.8* 06/26/2015   ALBUMIN 2.9* 06/26/2015   BILITOT 1.0 06/26/2015   ALKPHOS 84 06/26/2015   AST 51* 06/26/2015   ALT 40 06/26/2015  .   Total Time in preparing paper work, data evaluation and todays exam - 35 minutes  Thurnell Lose M.D on 06/27/2015 at 9:51 AM  [facility]   Office  [phone]

## 2015-06-30 ENCOUNTER — Other Ambulatory Visit: Payer: Self-pay | Admitting: Family Medicine

## 2015-06-30 LAB — CULTURE, BLOOD (ROUTINE X 2)
CULTURE: NO GROWTH
Culture: NO GROWTH

## 2015-07-01 MED FILL — ZOLPIDEM TARTRATE 10 MG TAB: 10 | 30 days supply | Qty: 30 | Fill #0

## 2015-07-29 MED FILL — ZOLPIDEM TARTRATE 10 MG TAB: 10 | 30 days supply | Qty: 30 | Fill #1

## 2015-08-26 MED FILL — ZOLPIDEM TARTRATE 10 MG TAB: 10 | 30 days supply | Qty: 30 | Fill #2

## 2015-09-01 ENCOUNTER — Other Ambulatory Visit: Payer: Self-pay | Admitting: Family Medicine

## 2015-09-01 DIAGNOSIS — G4700 Insomnia, unspecified: Secondary | ICD-10-CM

## 2015-09-02 NOTE — Telephone Encounter (Signed)
ambien Rx ready for pick up

## 2015-09-02 NOTE — Telephone Encounter (Signed)
Called pt via [place] interpreter [place], [place]. Did not receive an answer.

## 2015-09-16 ENCOUNTER — Other Ambulatory Visit: Payer: Self-pay | Admitting: Family Medicine

## 2015-09-16 MED ORDER — THIAMINE HCL 100 MG PO TABS: 100.0000 mg | ORAL_TABLET | Freq: Every day | ORAL | Status: DC

## 2015-09-16 MED ORDER — FOLIC ACID 1 MG PO TABS: 1.0000 mg | ORAL_TABLET | Freq: Every day | ORAL | Status: DC

## 2015-09-16 MED FILL — FOLIC ACID 1 MG TABLET: 1 | 30 days supply | Qty: 30 | Fill #0

## 2015-09-16 NOTE — Telephone Encounter (Signed)
Pt requesting refill on all his medications minus the Ambien  Pt will call next week to schedule appt in August

## 2015-09-16 NOTE — Telephone Encounter (Signed)
Refill request unclear? 

## 2015-09-16 NOTE — Telephone Encounter (Signed)
The only chronic medications he has been on are folic acid and thiamine. The lactulose and Librium taper were short term. I have refilled the folic acid and thiamine for now and will let Dr. Adrian Blackwater address at follow up visit.

## 2015-09-23 ENCOUNTER — Other Ambulatory Visit: Payer: Self-pay | Admitting: Family Medicine

## 2015-09-23 DIAGNOSIS — G4700 Insomnia, unspecified: Secondary | ICD-10-CM

## 2015-09-23 MED FILL — ZOLPIDEM TARTRATE 10 MG TAB: 10 | 30 days supply | Qty: 30 | Fill #0

## 2015-09-24 ENCOUNTER — Other Ambulatory Visit: Payer: Self-pay | Admitting: Family Medicine

## 2015-09-24 DIAGNOSIS — G4700 Insomnia, unspecified: Secondary | ICD-10-CM

## 2015-09-25 NOTE — Telephone Encounter (Signed)
Rx request 

## 2015-10-12 ENCOUNTER — Ambulatory Visit: Payer: Self-pay | Attending: Family Medicine | Admitting: Family Medicine

## 2015-10-12 ENCOUNTER — Encounter: Payer: Self-pay | Admitting: Family Medicine

## 2015-10-12 VITALS — BP 126/85 | HR 118 | Temp 98.5°F | Wt 135.8 lb

## 2015-10-12 DIAGNOSIS — E7800 Pure hypercholesterolemia, unspecified: Secondary | ICD-10-CM

## 2015-10-12 DIAGNOSIS — M2550 Pain in unspecified joint: Secondary | ICD-10-CM

## 2015-10-12 DIAGNOSIS — K7030 Alcoholic cirrhosis of liver without ascites: Secondary | ICD-10-CM

## 2015-10-12 DIAGNOSIS — G4700 Insomnia, unspecified: Secondary | ICD-10-CM

## 2015-10-12 DIAGNOSIS — Z13220 Encounter for screening for lipoid disorders: Secondary | ICD-10-CM

## 2015-10-12 MED ORDER — ZOLPIDEM TARTRATE 10 MG PO TABS
10.0000 mg | ORAL_TABLET | Freq: Every evening | ORAL | 5 refills | Status: DC | PRN
Start: 2015-10-12 — End: 2016-04-19

## 2015-10-12 MED ORDER — MIRTAZAPINE 15 MG PO TABS: 15.0000 mg | ORAL_TABLET | Freq: Every day | ORAL | 5 refills | Status: DC

## 2015-10-12 MED FILL — MIRTAZAPINE 15 MG TABLET: 15 | 30 days supply | Qty: 30 | Fill #0

## 2015-10-12 NOTE — Progress Notes (Signed)
Patient ID: [name], male   DOB: 07-07-1965, 50 y.o.   MRN: [id number]   Subjective:  Patient ID: [name], male    DOB: 28-Mar-1965  Age: 50 y.o. MRN: [id number]  CC: Medication Refill   HPI Zadien Isensee has hx of alcoholic cirrhosis,  insomnia he presents for    1. Insomnia: he has chronic insomnia with depressed mood. He has tried celexa and trazodone in the past without improvement. He worries about his child. He reports poor appetite.  He denies suicidal ideation. He denies ETOH. He currently takes ambien nightly and request refill.   2. Alcoholic cirrhosis: he denies daily alcohol. He denies N/V/bleeding/bruising. No edema.   HM: he refuses screening colonoscopy  Social History  Substance Use Topics  . Smoking status: Current Every Day Smoker    Packs/day: 0.50    Types: Cigarettes  . Smokeless tobacco: Never Used  . Alcohol use Yes     Comment: pt states last drink was 3 days ago    Outpatient Medications Prior to Visit  Medication Sig Dispense Refill  . chlordiazePOXIDE (LIBRIUM) 5 MG capsule Please dispense 18 pills - Take 1 pill three times a day for 3 days, then Take 1 pill two times a day for 3 days, then Take 1 pill once a day for 3 days and stop. 18 capsule 0  . folic acid (FOLVITE) 1 MG tablet Take 1 tablet (1 mg total) by mouth daily. 30 tablet 0  . lactulose (CHRONULAC) 10 GM/15ML solution Take 30 mLs (20 g total) by mouth daily. 240 mL 0  . thiamine 100 MG tablet Take 1 tablet (100 mg total) by mouth daily. 30 tablet 0  . zolpidem (AMBIEN) 10 MG tablet TAKE 1 TABLET BY MOUTH AT BEDTIME AS NEEDED FOR SLEEP 30 tablet 0   No facility-administered medications prior to visit.     ROS Review of Systems  Constitutional: Positive for appetite change. Negative for chills, fatigue, fever and unexpected weight change.  Eyes: Negative for visual disturbance.  Respiratory: Negative for cough and shortness of breath.   Cardiovascular: Negative  for chest pain, palpitations and leg swelling.  Gastrointestinal: Negative for abdominal pain, blood in stool, constipation, diarrhea, nausea and vomiting.  Endocrine: Negative for polydipsia, polyphagia and polyuria.  Musculoskeletal: Negative for arthralgias, back pain, gait problem, myalgias and neck pain.  Skin: Negative for rash.  Allergic/Immunologic: Negative for immunocompromised state.  Hematological: Negative for adenopathy. Does not bruise/bleed easily.  Psychiatric/Behavioral: Positive for dysphoric mood and sleep disturbance. Negative for suicidal ideas. The patient is nervous/anxious.     Objective:  BP 126/85 (BP Location: Left Arm, Patient Position: Sitting, Cuff Size: Large)   Pulse (!) 118   Temp 98.5 F (36.9 C) (Oral)   Wt 135 lb 12.8 oz (61.6 kg)   BMI 20.05 kg/m   BP/Weight 10/12/2015 06/27/2015 123XX123  Systolic BP 123XX123 [id number] -  Diastolic BP 85 88 -  Wt. (Lbs) 135.8 - 122.88  BMI 20.05 - -   Wt Readings from Last 3 Encounters:  10/12/15 135 lb 12.8 oz (61.6 kg)  06/26/15 122 lb 14.2 oz (55.7 kg)  03/15/15 136 lb (61.7 kg)    Physical Exam  Constitutional: He appears well-developed and well-nourished. No distress.  HENT:  Head: Normocephalic and atraumatic.  Neck: Normal range of motion. Neck supple.  Cardiovascular: Normal rate, regular rhythm, normal heart sounds and intact distal pulses.   Pulmonary/Chest: Effort normal.  Musculoskeletal: He exhibits no edema.  Neurological:  He is alert.  Skin: Skin is warm and dry. No rash noted. No erythema.  Psychiatric: He has a normal mood and affect.   Depression screen [facility] 2/9 10/12/2015 12/29/2014 11/26/2014 10/23/2014  Decreased Interest 3 1 1  0  Down, Depressed, Hopeless 2 2 2 1   PHQ - 2 Score 5 3 3 1   Altered sleeping 3 2 1  -  Tired, decreased energy 3 2 2  -  Change in appetite 3 1 1  -  Feeling bad or failure about yourself  2 1 2  -  Trouble concentrating 2 1 1  -  Moving slowly or fidgety/restless 1 1 1   -  Suicidal thoughts 0 0 0 -  PHQ-9 Score 19 11 11  -  Difficult doing work/chores Somewhat difficult - - -   GAD 7 : Generalized Anxiety Score 10/12/2015 12/29/2014  Nervous, Anxious, on Ian 2 1  Control/stop worrying 2 1  Worry too much - different things 2 2  Trouble relaxing 2 2  Restless 2 1  Easily annoyed or irritable 1 1  Afraid - awful might happen 1 1  Total GAD 7 Score 12 9  Anxiety Difficulty Somewhat difficult -      Assessment & Plan:   Problem List Items Addressed This Visit      High   Liver cirrhosis (HCC) (Chronic)   Relevant Orders   COMPLETE METABOLIC PANEL WITH GFR   Protime-INR     Medium   Insomnia (Chronic)   Relevant Medications   mirtazapine (REMERON) 15 MG tablet   zolpidem (AMBIEN) 10 MG tablet    Other Visit Diagnoses    Lipid screening    -  Primary   Elevated cholesterol       Relevant Orders   Lipid Panel      No orders of the defined types were placed in this encounter.   Follow-up: No Follow-up on file.   Boykin Nearing MD

## 2015-10-12 NOTE — Patient Instructions (Addendum)
[name] was seen today for medication refill.  Diagnoses and all orders for this visit:  Lipid screening -     Cancel: Lipid Panel  Elevated cholesterol -     Cancel: Lipid Panel -     Lipid Panel; Future  Insomnia -     mirtazapine (REMERON) 15 MG tablet; Take 1 tablet (15 mg total) by mouth at bedtime. -     zolpidem (AMBIEN) 10 MG tablet; Take 1 tablet (10 mg total) by mouth at bedtime as needed. for sleep  Alcoholic cirrhosis of liver without ascites (HCC) -     COMPLETE METABOLIC PANEL WITH GFR; Future -     Protime-INR; Future   Please return next week for labs and to start Hep A (0 and 6 months) and Hep B (0,1,6 months) vaccine series  F/u with me in 2 month for insomnia   Dr. Adrian Blackwater

## 2015-10-12 NOTE — Progress Notes (Signed)
Pt states he is here today for six month follow up and medication refill. He states he having trouble with insomnia; no appetite; no energy an very nervous. Pt states he would like to have his cholesterol checked.

## 2015-10-14 NOTE — Assessment & Plan Note (Signed)
Assessment: Insomnia and poor appetite in setting of alcohol use   Plan: ambien Add remeron Advised counseling, community resources provided

## 2015-10-14 NOTE — Assessment & Plan Note (Signed)
Resolved

## 2015-10-14 NOTE — Assessment & Plan Note (Signed)
Cirrhosis with reports of minimal alcohol intake  P: CMP, INR, CBC for MELD score Hep A and Hep B series

## 2015-10-20 ENCOUNTER — Other Ambulatory Visit: Payer: Self-pay

## 2015-10-25 MED FILL — ZOLPIDEM TARTRATE 10 MG TAB: 10 | 30 days supply | Qty: 30 | Fill #0

## 2015-11-24 MED FILL — ZOLPIDEM TARTRATE 10 MG TAB: 10 | 30 days supply | Qty: 30 | Fill #1

## 2015-11-24 MED FILL — MIRTAZAPINE 15 MG TABLET: 15 | 30 days supply | Qty: 30 | Fill #1

## 2015-12-24 MED FILL — MIRTAZAPINE 15 MG TABLET: 15 | 30 days supply | Qty: 30 | Fill #2

## 2015-12-24 MED FILL — ZOLPIDEM TARTRATE 10 MG TAB: 10 | 30 days supply | Qty: 30 | Fill #2

## 2016-01-24 MED FILL — ZOLPIDEM TARTRATE 10 MG TAB: 10 | 30 days supply | Qty: 30 | Fill #3

## 2016-01-24 MED FILL — MIRTAZAPINE 15 MG TABLET: 15 | 30 days supply | Qty: 30 | Fill #3

## 2016-02-22 MED FILL — ZOLPIDEM TARTRATE 10 MG TAB: 10 | 30 days supply | Qty: 30 | Fill #4

## 2016-02-22 MED FILL — MIRTAZAPINE 15 MG TABLET: 15 | 30 days supply | Qty: 30 | Fill #4

## 2016-03-23 MED FILL — MIRTAZAPINE 15 MG TABLET: 15 | 30 days supply | Qty: 30 | Fill #5

## 2016-03-23 MED FILL — ZOLPIDEM TARTRATE 10 MG TAB: 10 | 30 days supply | Qty: 30 | Fill #5

## 2016-04-19 ENCOUNTER — Other Ambulatory Visit: Payer: Self-pay | Admitting: Family Medicine

## 2016-04-19 DIAGNOSIS — G4700 Insomnia, unspecified: Secondary | ICD-10-CM

## 2016-04-24 NOTE — Telephone Encounter (Signed)
Pt. Called requesting a refill for the following medications:  mirtazapine (REMERON) 15 MG tablet   zolpidem (AMBIEN) 10 MG tablet   Please f/u

## 2016-04-25 ENCOUNTER — Other Ambulatory Visit: Payer: Self-pay | Admitting: Family Medicine

## 2016-04-25 DIAGNOSIS — G4700 Insomnia, unspecified: Secondary | ICD-10-CM

## 2016-04-25 MED FILL — MIRTAZAPINE 15 MG TABLET: 15 | 30 days supply | Qty: 30 | Fill #0

## 2016-04-25 NOTE — Telephone Encounter (Signed)
Please inform patient Ambien and Remeron refilled He is advised to schedule f/u visit

## 2016-04-25 NOTE — Telephone Encounter (Signed)
F/u visit for insomnia

## 2016-04-26 ENCOUNTER — Telehealth: Payer: Self-pay

## 2016-04-26 NOTE — Telephone Encounter (Signed)
Pt was called and informed of script being ready for pick up and medication being sent over to his pharmacy.

## 2016-04-27 MED FILL — ZOLPIDEM TARTRATE 10 MG TAB: 10 | 30 days supply | Qty: 30 | Fill #0

## 2016-05-05 ENCOUNTER — Encounter: Payer: Self-pay | Admitting: Family Medicine

## 2016-05-05 ENCOUNTER — Ambulatory Visit: Payer: Self-pay | Attending: Family Medicine | Admitting: Family Medicine

## 2016-05-05 VITALS — BP 112/79 | HR 109 | Temp 98.3°F | Ht 66.0 in | Wt 137.8 lb

## 2016-05-05 DIAGNOSIS — K7030 Alcoholic cirrhosis of liver without ascites: Secondary | ICD-10-CM | POA: Insufficient documentation

## 2016-05-05 DIAGNOSIS — F10982 Alcohol use, unspecified with alcohol-induced sleep disorder: Secondary | ICD-10-CM

## 2016-05-05 DIAGNOSIS — G4700 Insomnia, unspecified: Secondary | ICD-10-CM | POA: Insufficient documentation

## 2016-05-05 DIAGNOSIS — F17210 Nicotine dependence, cigarettes, uncomplicated: Secondary | ICD-10-CM | POA: Insufficient documentation

## 2016-05-05 MED ORDER — MIRTAZAPINE 15 MG PO TABS: 15.0000 mg | ORAL_TABLET | Freq: Every day | ORAL | 5 refills | Status: DC

## 2016-05-05 MED ORDER — ZOLPIDEM TARTRATE ER 12.5 MG PO TBCR: 12.5000 mg | EXTENDED_RELEASE_TABLET | Freq: Every evening | ORAL | 5 refills | Status: DC | PRN

## 2016-05-05 NOTE — Patient Instructions (Addendum)
[name] was seen today for insomnia.  Diagnoses and all orders for this visit:  Insomnia, unspecified type -     zolpidem (AMBIEN CR) 12.5 MG CR tablet; Take 1 tablet (12.5 mg total) by mouth at bedtime as needed for sleep.  Alcoholic cirrhosis of liver without ascites (HCC) -     COMPLETE METABOLIC PANEL WITH GFR -     Ammonia -     Protime-INR  Alcohol-induced insomnia (HCC) -     mirtazapine (REMERON) 15 MG tablet; Take 1 tablet (15 mg total) by mouth at bedtime.  call in two weeks of Ambien CR does not improve insomnia and Remeron dose increase needed See handout about alcohol treatment  F/u in 6 months for cirrhosis and insomnia   Dr. Adrian Blackwater

## 2016-05-05 NOTE — Progress Notes (Signed)
Patient ID: [name], male   DOB: 01/26/1966, 51 y.o.   MRN: [id number]   Subjective:  Patient ID: [name], male    DOB: 06-Mar-1965  Age: 51 y.o. MRN: [id number]  CC: Insomnia   HPI [name] has hx of alcoholic cirrhosis,  insomnia he presents for    1. Insomnia: he has chronic insomnia with depressed mood. He has tried celexa and trazodone in the past without improvement. He worries about his child. He reports poor appetite.  He denies suicidal ideation. He denies ETOH. He currently takes Ambien 10 mg nightly and reports it is not controlling his insomnia.   2. Alcoholic cirrhosis: he repots drinking 1 time per week. He drinks beer. He drinks 80 oz of beer once. He denies N/V/bleeding/bruising. No edema.   HM: he refuses screening colonoscopy  Social History  Substance Use Topics  . Smoking status: Current Every Day Smoker    Packs/day: 0.50    Types: Cigarettes  . Smokeless tobacco: Never Used  . Alcohol use Yes     Comment: pt states last drink was 3 days ago    Outpatient Medications Prior to Visit  Medication Sig Dispense Refill  . mirtazapine (REMERON) 15 MG tablet TAKE 1 TABLET (15 MG TOTAL) BY MOUTH AT BEDTIME. 30 tablet 2  . zolpidem (AMBIEN) 10 MG tablet TAKE 1 TABLET BY MOUTH ONCE DAILY AT BEDTIME AS NEEDED FOR SLEEP 30 tablet 2   No facility-administered medications prior to visit.     ROS Review of Systems  Constitutional: Positive for appetite change. Negative for chills, fatigue, fever and unexpected weight change.  Eyes: Negative for visual disturbance.  Respiratory: Negative for cough and shortness of breath.   Cardiovascular: Negative for chest pain, palpitations and leg swelling.  Gastrointestinal: Negative for abdominal pain, blood in stool, constipation, diarrhea, nausea and vomiting.  Endocrine: Negative for polydipsia, polyphagia and polyuria.  Musculoskeletal: Negative for arthralgias, back pain, gait problem, myalgias  and neck pain.  Skin: Negative for rash.  Allergic/Immunologic: Negative for immunocompromised state.  Hematological: Negative for adenopathy. Does not bruise/bleed easily.  Psychiatric/Behavioral: Positive for dysphoric mood and sleep disturbance. Negative for suicidal ideas. The patient is nervous/anxious.     Objective:  BP 112/79 (BP Location: Left Arm, Patient Position: Sitting, Cuff Size: Small)   Pulse (!) 109   Temp 98.3 F (36.8 C) (Oral)   Ht 5\' 6"  (1.676 m)   Wt 137 lb 12.8 oz (62.5 kg)   SpO2 97%   BMI 22.24 kg/m   BP/Weight 05/05/2016 10/12/2015 7/82/9562  Systolic BP 130 865 784  Diastolic BP 79 85 88  Wt. (Lbs) 137.8 135.8 -  BMI 22.24 20.05 -   Wt Readings from Last 3 Encounters:  05/05/16 137 lb 12.8 oz (62.5 kg)  10/12/15 135 lb 12.8 oz (61.6 kg)  06/26/15 122 lb 14.2 oz (55.7 kg)    Physical Exam  Constitutional: He appears well-developed and well-nourished. No distress.  HENT:  Head: Normocephalic and atraumatic.  Neck: Normal range of motion. Neck supple.  Cardiovascular: Normal rate, regular rhythm, normal heart sounds and intact distal pulses.   Pulmonary/Chest: Effort normal.  Abdominal: Soft. Bowel sounds are normal. He exhibits no distension and no mass. There is no tenderness. There is no rebound and no guarding.  Musculoskeletal: He exhibits no edema.  Neurological: He is alert.  Skin: Skin is warm and dry. No rash noted. No erythema.  Psychiatric: He has a normal mood and affect.  Depression screen [facility] 2/9 10/12/2015 12/29/2014 11/26/2014 10/23/2014  Decreased Interest 3 1 1  0  Down, Depressed, Hopeless 2 2 2 1   PHQ - 2 Score 5 3 3 1   Altered sleeping 3 2 1  -  Tired, decreased energy 3 2 2  -  Change in appetite 3 1 1  -  Feeling bad or failure about yourself  2 1 2  -  Trouble concentrating 2 1 1  -  Moving slowly or fidgety/restless 1 1 1  -  Suicidal thoughts 0 0 0 -  PHQ-9 Score 19 11 11  -  Difficult doing work/chores Somewhat difficult - -  -   GAD 7 : Generalized Anxiety Score 05/05/2016 10/12/2015 12/29/2014  Nervous, Anxious, on Edge 0 2 1  Control/stop worrying 1 2 1   Worry too much - different things 1 2 2   Trouble relaxing 1 2 2   Restless 1 2 1   Easily annoyed or irritable 1 1 1   Afraid - awful might happen 0 1 1  Total GAD 7 Score 5 12 9   Anxiety Difficulty - Somewhat difficult -    Assessment & Plan:   Problem List Items Addressed This Visit      High   Liver cirrhosis (HCC) (Chronic)   Relevant Orders   Protime-INR   Ammonia   COMPLETE METABOLIC PANEL WITH GFR   CBC     Medium   Insomnia - Primary (Chronic)   Relevant Medications   zolpidem (AMBIEN CR) 12.5 MG CR tablet   mirtazapine (REMERON) 15 MG tablet      No orders of the defined types were placed in this encounter.   Follow-up: No Follow-up on file.   Boykin Nearing MD

## 2016-05-08 ENCOUNTER — Telehealth: Payer: Self-pay | Admitting: Pharmacist

## 2016-05-08 NOTE — Telephone Encounter (Signed)
Received call from pharmacy. Pharmacist wanted to know if they should fill the zolpidem 12.5 mg CR tablet as patient has a 19 day supply of the zolpidem 10 mg left. Will forward to Dr. Adrian Blackwater, but I told the pharmacy to hold filling it until Dr. Adrian Blackwater reviews and decides if it is appropriate.

## 2016-05-09 NOTE — Assessment & Plan Note (Signed)
Suspect this is related to alcohol abuse Continue Remeron 15 mg nightly Increase Ambien to 12.5 mg CR nightly

## 2016-05-09 NOTE — Telephone Encounter (Signed)
[facility]ed [facility] Left VM Authorized fill of ambien CR

## 2016-05-09 NOTE — Assessment & Plan Note (Signed)
Cirrhosis with ongoing alcohol use Plan: CMP, CBC, insomnia, INR

## 2016-05-10 ENCOUNTER — Telehealth: Payer: Self-pay | Admitting: Family Medicine

## 2016-05-10 DIAGNOSIS — G4700 Insomnia, unspecified: Secondary | ICD-10-CM

## 2016-05-10 NOTE — Telephone Encounter (Signed)
Manuela Schwartz from outpatient pharmacy calling with questions regarding refill of pts Ambien States this refill would be 17 days early and would like to speak to PCP directly before filling

## 2016-05-11 MED ORDER — ZOLPIDEM TARTRATE ER 6.25 MG PO TBCR: 6.2500 mg | EXTENDED_RELEASE_TABLET | Freq: Every evening | ORAL | 2 refills | Status: DC | PRN

## 2016-05-11 NOTE — Telephone Encounter (Signed)
Called back to [facility] No answer Left VM Plan Wait to refill Ambien Patient still needs to return for labs to check liver function If he liver function is normal the 12.5 mg CR ambien will be re-ordered Change Rx from the 12.5 mg CR to the recommended max dose of 6.25 mg CR for patient's with liver any degree of liver impairment  Attempted to call patient with update in plan, but both #s were to voicemail with unrecognizable names on outgoing messages

## 2016-05-29 ENCOUNTER — Telehealth: Payer: Self-pay | Admitting: Family Medicine

## 2016-05-29 MED FILL — ZOLPIDEM TART ER 6.25 MG TA: 6.25 | 30 days supply | Qty: 30 | Fill #0

## 2016-05-29 NOTE — Telephone Encounter (Signed)
Will route to PCP 

## 2016-05-29 NOTE — Telephone Encounter (Signed)
Pt. Called requesting to speak with PCP regarding his Ambien. Pt. States that his PCP had increased his dosage to 12.5 mg.  Pt. Has not been able to get his medication. Please f/u with pt.

## 2016-05-30 ENCOUNTER — Telehealth: Payer: Self-pay

## 2016-05-30 NOTE — Telephone Encounter (Signed)
Patient contacted the office and requested a refill on his Lorrin Mais and why his dosage change. I informed pt that per Dr. Adrian Blackwater note she will need for him to return to the office for lab work and if his liver function is normal she will re-order the 12.5mg  of ambien. I informed pt that his dosage for Lorrin Mais was changed to 6.25mg . I informed pt that Dr. Adrian Blackwater tried to contact him to but she was unable to reach him. Pt will be coming in for lab work on Thursday.

## 2016-05-30 NOTE — Telephone Encounter (Signed)
Patient will come in for liver function testing

## 2016-05-31 NOTE — Telephone Encounter (Signed)
Noted  

## 2016-06-01 ENCOUNTER — Ambulatory Visit: Payer: Self-pay | Attending: Family Medicine

## 2016-06-01 DIAGNOSIS — K7030 Alcoholic cirrhosis of liver without ascites: Secondary | ICD-10-CM | POA: Insufficient documentation

## 2016-06-01 DIAGNOSIS — G4700 Insomnia, unspecified: Secondary | ICD-10-CM

## 2016-06-01 DIAGNOSIS — E7800 Pure hypercholesterolemia, unspecified: Secondary | ICD-10-CM

## 2016-06-01 NOTE — Progress Notes (Signed)
Patient here for lab visit only 

## 2016-06-02 LAB — CMP14+EGFR
ALBUMIN: 4.8 g/dL (ref 3.5–5.5)
ALT: 37 IU/L (ref 0–44)
AST: 102 IU/L — ABNORMAL HIGH (ref 0–40)
Albumin/Globulin Ratio: 1.5 (ref 1.2–2.2)
Alkaline Phosphatase: 129 IU/L — ABNORMAL HIGH (ref 39–117)
BUN / CREAT RATIO: 17 (ref 9–20)
BUN: 11 mg/dL (ref 6–24)
Bilirubin Total: 0.6 mg/dL (ref 0.0–1.2)
CALCIUM: 9.4 mg/dL (ref 8.7–10.2)
CO2: 20 mmol/L (ref 18–29)
CREATININE: 0.64 mg/dL — AB (ref 0.76–1.27)
Chloride: 89 mmol/L — ABNORMAL LOW (ref 96–106)
GFR calc Af Amer: 130 mL/min/{1.73_m2} (ref >59)
GFR, EST NON AFRICAN AMERICAN: 113 mL/min/{1.73_m2} (ref >59)
GLOBULIN, TOTAL: 3.3 g/dL (ref 1.5–4.5)
Glucose: 55 mg/dL — ABNORMAL LOW (ref 65–99)
Potassium: 4.1 mmol/L (ref 3.5–5.2)
SODIUM: 138 mmol/L (ref 134–144)
TOTAL PROTEIN: 8.1 g/dL (ref 6.0–8.5)

## 2016-06-02 LAB — LIPID PANEL
CHOL/HDL RATIO: 4.2 ratio (ref 0.0–5.0)
Cholesterol, Total: 294 mg/dL — ABNORMAL HIGH (ref 100–199)
HDL: 70 mg/dL (ref >39)
LDL Calculated: 197 mg/dL — ABNORMAL HIGH (ref 0–99)
TRIGLYCERIDES: 136 mg/dL (ref 0–149)
VLDL Cholesterol Cal: 27 mg/dL (ref 5–40)

## 2016-06-02 LAB — PROTIME-INR
INR: 1 (ref 0.8–1.2)
Prothrombin Time: 10.4 s (ref 9.1–12.0)

## 2016-06-02 LAB — CBC
HEMATOCRIT: 44.8 % (ref 37.5–51.0)
HEMOGLOBIN: 14.9 g/dL (ref 13.0–17.7)
MCH: 31 pg (ref 26.6–33.0)
MCHC: 33.3 g/dL (ref 31.5–35.7)
MCV: 93 fL (ref 79–97)
Platelets: 342 10*3/uL (ref 150–379)
RBC: 4.8 x10E6/uL (ref 4.14–5.80)
RDW: 14.6 % (ref 12.3–15.4)
WBC: 12.5 10*3/uL — ABNORMAL HIGH (ref 3.4–10.8)

## 2016-06-02 LAB — AMMONIA: Ammonia: 158 ug/dL (ref 27–102)

## 2016-06-09 DIAGNOSIS — E785 Hyperlipidemia, unspecified: Secondary | ICD-10-CM | POA: Insufficient documentation

## 2016-06-09 NOTE — Assessment & Plan Note (Signed)
Moderate to high intensity statin recommended Patient with liver dysfunction with rise in AST and ALT from baseline Will not order statin at this time

## 2016-06-09 NOTE — Assessment & Plan Note (Addendum)
Cirrhosis with evidence of elevated liver enzymes consistent with ongoing alcohol abuse Plan: Alcohol reduction and cessation Keep ambien at recommended dose of 6.25 mg CR for mild to moderate liver impairment

## 2016-06-09 NOTE — Assessment & Plan Note (Signed)
Keep ambien at recommended dose of 6.25 mg CR for mild to moderate liver impairment

## 2016-06-13 ENCOUNTER — Telehealth: Payer: Self-pay | Admitting: Family Medicine

## 2016-06-13 DIAGNOSIS — E7220 Disorder of urea cycle metabolism, unspecified: Secondary | ICD-10-CM

## 2016-06-13 DIAGNOSIS — G4700 Insomnia, unspecified: Secondary | ICD-10-CM

## 2016-06-13 NOTE — Telephone Encounter (Signed)
Patient call to speak with the nurse about the lab result for liver function testing, please follow up with patient

## 2016-06-16 NOTE — Telephone Encounter (Signed)
Pt is aware of his results. Pt states he doesn't drink. Pt states what can he do in order to get his liver function down and his ammonia. Pt states what does this mean for him in order to get his Lorrin Mais?

## 2016-06-21 ENCOUNTER — Telehealth: Payer: Self-pay

## 2016-06-21 ENCOUNTER — Telehealth: Payer: Self-pay | Admitting: Family Medicine

## 2016-06-21 NOTE — Telephone Encounter (Signed)
Patient returning call from nurse to receive lab results

## 2016-06-21 NOTE — Telephone Encounter (Signed)
Pt was called and a VM was left informing pt to return phone call for lab results. 

## 2016-06-23 MED FILL — MIRTAZAPINE 15 MG TABLET: 15 | 30 days supply | Qty: 30 | Fill #0

## 2016-06-27 ENCOUNTER — Telehealth: Payer: Self-pay | Admitting: Family Medicine

## 2016-06-27 MED ORDER — LACTULOSE 10 GM/15ML PO SOLN: 10.0000 g | Freq: Two times a day (BID) | ORAL | 0 refills | Status: DC

## 2016-06-27 MED FILL — GENERLAC 10 GM/15 ML SOLN: 10 | 4 days supply | Qty: 120 | Fill #0

## 2016-06-27 NOTE — Telephone Encounter (Signed)
Patient called the office asking to speak with nurse regarding his lab results.   Thank you.

## 2016-06-27 NOTE — Telephone Encounter (Signed)
Patient called the office to request refill for zolpidem (AMBIEN CR) 6.25 MG CR tablet. Please send it to [facility] outpatient pharmacy.   Thank you.

## 2016-06-27 NOTE — Telephone Encounter (Signed)
Lactulose sent for elevated ammonia Ambien 6.25 mg CR was phoned into [facility] on 05/10/16

## 2016-06-27 NOTE — Telephone Encounter (Signed)
Pt was called and informed of lab results. Pt's medication has been refilled.

## 2016-06-27 NOTE — Telephone Encounter (Signed)
Ambien was phone into [facility] on 05/10/16 with 2 refills, patient informed

## 2016-06-28 ENCOUNTER — Other Ambulatory Visit: Payer: Self-pay | Admitting: Family Medicine

## 2016-06-28 DIAGNOSIS — G4700 Insomnia, unspecified: Secondary | ICD-10-CM

## 2016-06-28 NOTE — Telephone Encounter (Signed)
Patient called states need zolpidem (AMBIEN CR) 6.25 MG CR tablet medication refill . Patient's pharmacy is saying no more refill available .  Please f/up

## 2016-06-28 NOTE — Telephone Encounter (Signed)
Pharmacy was called and states that script was filled on 05/29/16 and picked up by patient on 06/01/16, pharmacy states that he has no refills left on the medication. Pt states that he needs a refill. Please follow up.

## 2016-06-29 MED ORDER — ZOLPIDEM TARTRATE ER 6.25 MG PO TBCR: 6.2500 mg | EXTENDED_RELEASE_TABLET | Freq: Every evening | ORAL | 5 refills | Status: DC | PRN

## 2016-06-29 NOTE — Telephone Encounter (Signed)
Pt was called and informed of script being ready for pick up.

## 2016-06-29 NOTE — Telephone Encounter (Signed)
Please call patient, [name] Rx ready for pick up

## 2016-06-30 MED FILL — ZOLPIDEM TART ER 6.25 MG TA: 6.25 | 30 days supply | Qty: 30 | Fill #0

## 2016-07-12 ENCOUNTER — Encounter: Payer: Self-pay | Admitting: Family Medicine

## 2016-08-07 MED FILL — ZOLPIDEM TART ER 6.25 MG TA: 6.25 | 30 days supply | Qty: 30 | Fill #1

## 2016-09-04 MED FILL — ZOLPIDEM TARTRATE 10 MG TAB: 10 | 30 days supply | Qty: 30 | Fill #1

## 2016-09-05 MED FILL — MIRTAZAPINE 15 MG TABLET: 15 | 30 days supply | Qty: 30 | Fill #1

## 2016-09-27 ENCOUNTER — Telehealth: Payer: Self-pay | Admitting: *Deleted

## 2016-09-27 DIAGNOSIS — G4700 Insomnia, unspecified: Secondary | ICD-10-CM

## 2016-09-27 NOTE — Telephone Encounter (Signed)
[name] at [facility] called on pt behalf for medication request: Ambien  Pt is going out of town this week. Requested to have early refill for this mediation. Pt advise.

## 2016-10-02 MED FILL — MIRTAZAPINE 15 MG TABLET: 15 | 30 days supply | Qty: 30 | Fill #2

## 2016-10-02 MED FILL — ZOLPIDEM TARTRATE 10 MG TAB: 10 | 30 days supply | Qty: 30 | Fill #2

## 2016-10-05 NOTE — Telephone Encounter (Signed)
Called back to [facility] He picked up 10 mg of Ambien on 10/02/2016. This was in 2nd refill of the 10 mg Ambien. He has no more 10 mg Ambien.  He is suppose to be on the 6.25 mg Ambien due to cirrhosis. He have discussed this. He was previously filling the 6.25 mg Ambien. He has 3 refills left.   Plan, refill 6.25 mg Ambien at next fill date. The pharmacy will make a note.

## 2016-11-06 ENCOUNTER — Other Ambulatory Visit: Payer: Self-pay | Admitting: Family Medicine

## 2016-11-06 NOTE — Telephone Encounter (Signed)
Pt. Called requesting a refill on Ambien. Pt. States he is out of the medication. Please f/u with pt.

## 2016-11-08 ENCOUNTER — Other Ambulatory Visit: Payer: Self-pay | Admitting: Family Medicine

## 2016-11-08 MED FILL — ZOLPIDEM TART ER 6.25 MG TA: 6.25 | 30 days supply | Qty: 30 | Fill #2

## 2016-12-06 ENCOUNTER — Telehealth: Payer: Self-pay | Admitting: Family Medicine

## 2016-12-06 MED FILL — ZOLPIDEM TART ER 6.25 MG TA: 6.25 | 30 days supply | Qty: 30 | Fill #3

## 2016-12-06 NOTE — Telephone Encounter (Signed)
Pt called requesting to have medication dosage changed to 10 mg instead of 6.5mg  for zolpidem (AMBIEN) 10 MG tablet. Pt states the lower dosage is not helping him Please f/up

## 2016-12-07 NOTE — Telephone Encounter (Signed)
Pt is aware and doesn't have any questions or concerns  

## 2017-01-05 ENCOUNTER — Telehealth: Payer: Self-pay | Admitting: Family Medicine

## 2017-01-05 MED ORDER — ZOLPIDEM TARTRATE ER 6.25 MG PO TBCR: 6.2500 mg | EXTENDED_RELEASE_TABLET | Freq: Every evening | ORAL | 0 refills | Status: DC | PRN

## 2017-01-05 NOTE — Telephone Encounter (Signed)
Pt called checking on status of medication refill for zolpidem (AMBIEN) 10 MG tablet. Pt called pharmacy and they do not have the rx. Please f/up

## 2017-01-05 NOTE — Telephone Encounter (Signed)
Pt requesting RF on Ambien. Last seen 04/2016. Has appt with me later this mth. Pt has cirrhosis. Per Dr. Adrian Blackwater last telephone conversation regarding dose, pt should be on Ambien 6.25 mg. I called [facility]. Rxn for the 6.25 mg that Dr. Adrian Blackwater had approved is now expired.  I will give one mth refill pending his appt with me [facility] reviewed and is appropriate.

## 2017-01-05 NOTE — Telephone Encounter (Signed)
Contacted pt and lvm made aware that rx is ready for pick up

## 2017-01-08 MED FILL — ZOLPIDEM TART ER 6.25 MG TA: 6.25 | 30 days supply | Qty: 30 | Fill #0

## 2017-01-23 ENCOUNTER — Ambulatory Visit: Payer: Self-pay | Attending: Internal Medicine | Admitting: Internal Medicine

## 2017-01-23 ENCOUNTER — Encounter: Payer: Self-pay | Admitting: Internal Medicine

## 2017-01-23 ENCOUNTER — Ambulatory Visit: Payer: Self-pay | Admitting: Licensed Clinical Social Worker

## 2017-01-23 VITALS — BP 139/82 | HR 115 | Temp 98.1°F | Resp 18 | Ht 66.0 in | Wt 140.0 lb

## 2017-01-23 DIAGNOSIS — Z8249 Family history of ischemic heart disease and other diseases of the circulatory system: Secondary | ICD-10-CM | POA: Insufficient documentation

## 2017-01-23 DIAGNOSIS — Z833 Family history of diabetes mellitus: Secondary | ICD-10-CM | POA: Insufficient documentation

## 2017-01-23 DIAGNOSIS — F419 Anxiety disorder, unspecified: Secondary | ICD-10-CM | POA: Insufficient documentation

## 2017-01-23 DIAGNOSIS — F329 Major depressive disorder, single episode, unspecified: Secondary | ICD-10-CM

## 2017-01-23 DIAGNOSIS — Z79899 Other long term (current) drug therapy: Secondary | ICD-10-CM | POA: Insufficient documentation

## 2017-01-23 DIAGNOSIS — G4700 Insomnia, unspecified: Secondary | ICD-10-CM | POA: Insufficient documentation

## 2017-01-23 DIAGNOSIS — F17210 Nicotine dependence, cigarettes, uncomplicated: Secondary | ICD-10-CM | POA: Insufficient documentation

## 2017-01-23 DIAGNOSIS — K7030 Alcoholic cirrhosis of liver without ascites: Secondary | ICD-10-CM | POA: Insufficient documentation

## 2017-01-23 DIAGNOSIS — E785 Hyperlipidemia, unspecified: Secondary | ICD-10-CM | POA: Insufficient documentation

## 2017-01-23 MED ORDER — ZOLPIDEM TARTRATE ER 6.25 MG PO TBCR: 6.2500 mg | EXTENDED_RELEASE_TABLET | Freq: Every evening | ORAL | 1 refills | Status: DC | PRN

## 2017-01-23 NOTE — Patient Instructions (Signed)
Please give patient forms for [facility] discount.   Please fill out the form for the [facility] discount.  Once you are approved we will be able to refer you to the gastroenterologist for follow-up on the cirrhosis.  Try getting in bed around about the same time every night. Once in bed you should turn off all lights and sound including the television. Avoid drinking caffeinated or alcoholic beverages at night.  Insomnia Insomnia is a sleep disorder that makes it difficult to fall asleep or to stay asleep. Insomnia can cause tiredness (fatigue), low energy, difficulty concentrating, mood swings, and poor performance at work or school. There are three different ways to classify insomnia:  Difficulty falling asleep.  Difficulty staying asleep.  Waking up too early in the morning.  Any type of insomnia can be long-term (chronic) or short-term (acute). Both are common. Short-term insomnia usually lasts for three months or less. Chronic insomnia occurs at least three times a week for longer than three months. What are the causes? Insomnia may be caused by another condition, situation, or substance, such as:  Anxiety.  Certain medicines.  Gastroesophageal reflux disease (GERD) or other gastrointestinal conditions.  Asthma or other breathing conditions.  Restless legs syndrome, sleep apnea, or other sleep disorders.  Chronic pain.  Menopause. This may include hot flashes.  Stroke.  Abuse of alcohol, tobacco, or illegal drugs.  Depression.  Caffeine.  Neurological disorders, such as Alzheimer disease.  An overactive thyroid (hyperthyroidism).  The cause of insomnia may not be known. What increases the risk? Risk factors for insomnia include:  Gender. Women are more commonly affected than men.  Age. Insomnia is more common as you get older.  Stress. This may involve your professional or personal life.  Income. Insomnia is more common in people with lower  income.  Lack of exercise.  Irregular work schedule or night shifts.  Traveling between different time zones.  What are the signs or symptoms? If you have insomnia, trouble falling asleep or trouble staying asleep is the main symptom. This may lead to other symptoms, such as:  Feeling fatigued.  Feeling nervous about going to sleep.  Not feeling rested in the morning.  Having trouble concentrating.  Feeling irritable, anxious, or depressed.  How is this treated? Treatment for insomnia depends on the cause. If your insomnia is caused by an underlying condition, treatment will focus on addressing the condition. Treatment may also include:  Medicines to help you sleep.  Counseling or therapy.  Lifestyle adjustments.  Follow these instructions at home:  Take medicines only as directed by your health care provider.  Keep regular sleeping and waking hours. Avoid naps.  Keep a sleep diary to help you and your health care provider figure out what could be causing your insomnia. Include: ? When you sleep. ? When you wake up during the night. ? How well you sleep. ? How rested you feel the next day. ? Any side effects of medicines you are taking. ? What you eat and drink.  Make your bedroom a comfortable place where it is easy to fall asleep: ? Put up shades or special blackout curtains to block light from outside. ? Use a white noise machine to block noise. ? Keep the temperature cool.  Exercise regularly as directed by your health care provider. Avoid exercising right before bedtime.  Use relaxation techniques to manage stress. Ask your health care provider to suggest some techniques that may work well for you. These may  include: ? Breathing exercises. ? Routines to release muscle tension. ? Visualizing peaceful scenes.  Cut back on alcohol, caffeinated beverages, and cigarettes, especially close to bedtime. These can disrupt your sleep.  Do not overeat or eat spicy  foods right before bedtime. This can lead to digestive discomfort that can make it hard for you to sleep.  Limit screen use before bedtime. This includes: ? Watching TV. ? Using your smartphone, tablet, and computer.  Stick to a routine. This can help you fall asleep faster. Try to do a quiet activity, brush your teeth, and go to bed at the same time each night.  Get out of bed if you are still awake after 15 minutes of trying to sleep. Keep the lights down, but try reading or doing a quiet activity. When you feel sleepy, go back to bed.  Make sure that you drive carefully. Avoid driving if you feel very sleepy.  Keep all follow-up appointments as directed by your health care provider. This is important. Contact a health care provider if:  You are tired throughout the day or have trouble in your daily routine due to sleepiness.  You continue to have sleep problems or your sleep problems get worse. Get help right away if:  You have serious thoughts about hurting yourself or someone else. This information is not intended to replace advice given to you by your health care provider. Make sure you discuss any questions you have with your health care provider. Document Released: 02/11/2000 Document Revised: 07/16/2015 Document Reviewed: 11/14/2013 Elsevier Interactive Patient Education  [employer].

## 2017-01-23 NOTE — BH Specialist Note (Signed)
Integrated Behavioral Health Initial Visit  MRN: [id number] Name: [name]  Number of [facility] Clinician visits:: 1/6 Session Start time: 4:30 PM  Session End time: 4:45 PM Total time: 15 minutes  Type of Service: [place] Interpretor:No. Interpretor Name and Language: N/A   Warm Hand Off Completed.       SUBJECTIVE: [name] is a 51 y.o. male accompanied by Mother Patient was referred by Dr. Wynetta Emery for depression and anxiety. Patient reports the following symptoms/concerns: feelings of sadness and worry, difficulty sleeping, low energy, irritability, and decreased concentration Duration of problem: Ongoing; Severity of problem: moderate  OBJECTIVE: Mood: Irritable and Affect: Appropriate Risk of harm to self or others: No plan to harm self or others  LIFE CONTEXT: Family and Social: Pt reports support from family School/Work: Pt is self-employed. He does not receive any public benefits Self-Care: Pt reports smoking cigarettes (1.5 ppd). Denies any other form of substance use Life Changes: Pt reports having insomnia without identified triggers  GOALS ADDRESSED: Patient will: 1. Reduce symptoms of: insomnia 2. Increase knowledge and/or ability of: sleep hygiene  3. Demonstrate ability to: Increase motivation to adhere to plan of care and Improve medication compliance  INTERVENTIONS: Interventions utilized: Sleep Hygiene and Psychoeducation and/or Health Education  Standardized Assessments completed: GAD-7 and PHQ 2&9  ASSESSMENT: Patient currently experiencing anxiety and depression triggered by insomnia. He reports feelings of sadness and worry, difficulty sleeping, low energy, irritability, and decreased concentration. Pt denies SI/HI/AVH.    Patient may benefit from psychoeducation and psychotherapy. He reports participating in medication management through PCP. LCSWA educated pt on the  correlation between one's mental and physical health. Pt was educated on tips to improve sleep hygiene. He is not interested in psychotherapy at this time.   PLAN: 1. Follow up with behavioral health clinician on : Pt was encouraged to contact LCSWA if symptoms worsen or fail to improve to schedule behavioral appointments at [facility]. 2. Behavioral recommendations: LCSWA recommends that pt apply healthy coping skills discussed. Pt is encouraged to schedule follow up appointment with LCSWA 3. Referral(s): [place] (In Clinic) 4. "From scale of 1-10, how likely are you to follow plan?": 7/10  Rebekah Chesterfield, LCSW 01/24/17 4:52 PM

## 2017-01-23 NOTE — Progress Notes (Signed)
Patient ID: [name], male    DOB: 1965-04-28  MRN: [id number]  CC: Establish Care and Medication Refill   Subjective: [name] is a 51 y.o. male who presents for chronic ds management. Mother is with him. Last saw Dr. Adrian Blackwater 04/2016 His concerns today include:  Pt with hx of ETOH cirrhosis, tobacco dep, insomnia and hyperlipidemia  1.ETOH Cirrhosis -drinks five12 oz beers a wk. Reports he has cut down significant compared to where he was in past -no abdominal swelling, LE edema -limits salt in foods -saw GI in past but it has been a while  2. Main concern today is insomnia. This is a chronic issue for him -was on Ambien 10 mg but Dr. Adrian Blackwater had dec to CR 6.25 mg. This is the highest dose recommend for pt with cirrhosis. -pt not pleased with the decrease stating he does not sleep well with the lower dose. Sleeps with TV on.  Was on Remeron also but has not taken in a few mths -endorses feeling depressed, low appetite. Currently going through a separation with a son involved -denies SI/HI. Not so much anxious as he is worried about his son  3. Hx of HL: states he does not eat greasy foods. Thinks it runs in his family Patient Active Problem List   Diagnosis Date Noted  . Hyperlipidemia 06/09/2016  . Insomnia 11/26/2014  . Liver cirrhosis ([place]) 09/21/2014  . Tobacco abuse 03/15/2011     Current Outpatient Medications on File Prior to Visit  Medication Sig Dispense Refill  . zolpidem (AMBIEN CR) 6.25 MG CR tablet Take 1 tablet (6.25 mg total) at bedtime as needed by mouth for sleep. 30 tablet 0  . lactulose (CHRONULAC) 10 GM/15ML solution Take 15 mLs (10 g total) by mouth 2 (two) times daily. (Patient not taking: Reported on 01/23/2017) 120 mL 0  . mirtazapine (REMERON) 15 MG tablet Take 1 tablet (15 mg total) by mouth at bedtime. (Patient not taking: Reported on 01/23/2017) 30 tablet 5   No current facility-administered medications on file prior to visit.      No Known Allergies  Social History   Socioeconomic History  . Marital status: Legally Separated    Spouse name: Not on file  . Number of children: Not on file  . Years of education: Not on file  . Highest education level: Not on file  Social Needs  . Financial resource strain: Not on file  . Food insecurity - worry: Not on file  . Food insecurity - inability: Not on file  . Transportation needs - medical: Not on file  . Transportation needs - non-medical: Not on file  Occupational History  . Not on file  Tobacco Use  . Smoking status: Current Every Day Smoker    Packs/day: 0.50    Types: Cigarettes  . Smokeless tobacco: Never Used  Substance and Sexual Activity  . Alcohol use: Yes    Comment: pt states last drink was 3 days ago  . Drug use: No  . Sexual activity: No  Other Topics Concern  . Not on file  Social History Narrative  . Not on file    Family History  Problem Relation Age of Onset  . Coronary artery disease Unknown   . Diabetes type II Unknown     Past Surgical History:  Procedure Laterality Date  . NO PAST SURGERIES      ROS: Review of Systems Neg except as above PHYSICAL EXAM: BP 139/82 (BP Location:  Left Arm, Patient Position: Sitting, Cuff Size: Normal)   Pulse (!) 115   Temp 98.1 F (36.7 C) (Oral)   Resp 18   Ht 5\' 6"  (1.676 m)   Wt 140 lb (63.5 kg)   SpO2 98%   BMI 22.60 kg/m   Wt Readings from Last 3 Encounters:  01/23/17 140 lb (63.5 kg)  05/05/16 137 lb 12.8 oz (62.5 kg)  10/12/15 135 lb 12.8 oz (61.6 kg)    Physical Exam  General appearance - alert, well appearing, and in no distress ental status -oriented. A little agitated when talking about insomnia Eyes: nonicteric Chest - clear to auscultation, no wheezes, rales or rhonchi, symmetric air entry Heart - normal rate, regular rhythm, normal S1, S2, no murmurs, rubs, clicks or gallops Abdomen - soft, nontender, nondistended, no masses or organomegaly Extremities -  peripheral pulses normal, no pedal edema, no clubbing or cyanosis  Depression screen [facility] 2/9 01/23/2017 05/05/2016 10/12/2015 12/29/2014 11/26/2014  Decreased Interest 1 0 3 1 1   Down, Depressed, Hopeless 1 1 2 2 2   PHQ - 2 Score 2 1 5 3 3   Altered sleeping 1 1 3 2 1   Tired, decreased energy 1 1 3 2 2   Change in appetite 2 1 3 1 1   Feeling bad or failure about yourself  1 1 2 1 2   Trouble concentrating 2 1 2 1 1   Moving slowly or fidgety/restless 2 1 1 1 1   Suicidal thoughts 0 0 0 0 0  PHQ-9 Score 11 7 19 11 11   Difficult doing work/chores - - Somewhat difficult - -   GAD 7 : Generalized Anxiety Score 01/23/2017 05/05/2016 10/12/2015 12/29/2014  Nervous, Anxious, on Edge 1 0 2 1  Control/stop worrying 2 1 2 1   Worry too much - different things 2 1 2 2   Trouble relaxing 2 1 2 2   Restless 2 1 2 1   Easily annoyed or irritable 2 1 1 1   Afraid - awful might happen 1 0 1 1  Total GAD 7 Score 12 5 12 9   Anxiety Difficulty - - Somewhat difficult -     .ASSESSMENT AND PLAN: 1. Insomnia, unspecified type -encouraged good sleep hygiene like turning off TV and not drinking at bedtime -I am not comfortable increasing the dose give the recommended dose for pts with cirrhosis is no higher than 6.25.  NCCSRS reviewed and is appropriate - zolpidem (AMBIEN CR) 6.25 MG CR tablet; Take 1 tablet (6.25 mg total) by mouth at bedtime as needed for sleep.  Dispense: 30 tablet; Refill: 1  2. Anxiety and depression -we discussed trying him with an SSRI vs Restarting Remeron. Pt informed that Remeron does have some sedative effect and can increase appetite. He prefers to take the Remeron. He still has RFs -LCSW to see pt today  3. Alcoholic cirrhosis of liver without ascites (HCC) - US Abdomen Limited RUQ; Future -advise to sign up for [facility] discount so that we can refer to GI in the future -encouraged him to try to abstain from ETOH completely Patient was given the opportunity to ask questions.  Patient  verbalized understanding of the plan and was able to repeat key elements of the plan.   Orders Placed This Encounter  Procedures  . US Abdomen Limited RUQ     Requested Prescriptions   Signed Prescriptions Disp Refills  . zolpidem (AMBIEN CR) 6.25 MG CR tablet 30 tablet 1    Sig: Take 1 tablet (6.25 mg total)  by mouth at bedtime as needed for sleep.    Return in about 4 months (around 05/23/2017).  [name], MD, FACP

## 2017-02-05 MED FILL — ZOLPIDEM TART ER 6.25 MG TA: 6.25 | 30 days supply | Qty: 30 | Fill #0

## 2017-03-15 MED FILL — ZOLPIDEM TART ER 6.25 MG TA: 6.25 | 30 days supply | Qty: 30 | Fill #1

## 2017-03-27 ENCOUNTER — Ambulatory Visit: Payer: Self-pay | Admitting: Internal Medicine

## 2017-04-12 ENCOUNTER — Encounter: Payer: Self-pay | Admitting: Internal Medicine

## 2017-04-12 ENCOUNTER — Ambulatory Visit: Payer: Self-pay | Admitting: Licensed Clinical Social Worker

## 2017-04-12 ENCOUNTER — Ambulatory Visit: Payer: Self-pay | Attending: Internal Medicine | Admitting: Internal Medicine

## 2017-04-12 VITALS — BP 129/90 | HR 128 | Temp 98.2°F | Resp 16 | Ht 67.0 in | Wt 144.8 lb

## 2017-04-12 DIAGNOSIS — M545 Low back pain: Secondary | ICD-10-CM | POA: Insufficient documentation

## 2017-04-12 DIAGNOSIS — F32A Depression, unspecified: Secondary | ICD-10-CM

## 2017-04-12 DIAGNOSIS — F329 Major depressive disorder, single episode, unspecified: Secondary | ICD-10-CM | POA: Insufficient documentation

## 2017-04-12 DIAGNOSIS — E785 Hyperlipidemia, unspecified: Secondary | ICD-10-CM | POA: Insufficient documentation

## 2017-04-12 DIAGNOSIS — K7031 Alcoholic cirrhosis of liver with ascites: Secondary | ICD-10-CM

## 2017-04-12 DIAGNOSIS — R109 Unspecified abdominal pain: Secondary | ICD-10-CM | POA: Insufficient documentation

## 2017-04-12 DIAGNOSIS — F17210 Nicotine dependence, cigarettes, uncomplicated: Secondary | ICD-10-CM | POA: Insufficient documentation

## 2017-04-12 DIAGNOSIS — Z79899 Other long term (current) drug therapy: Secondary | ICD-10-CM | POA: Insufficient documentation

## 2017-04-12 DIAGNOSIS — Z8249 Family history of ischemic heart disease and other diseases of the circulatory system: Secondary | ICD-10-CM | POA: Insufficient documentation

## 2017-04-12 DIAGNOSIS — F1020 Alcohol dependence, uncomplicated: Secondary | ICD-10-CM

## 2017-04-12 DIAGNOSIS — G4700 Insomnia, unspecified: Secondary | ICD-10-CM | POA: Insufficient documentation

## 2017-04-12 DIAGNOSIS — Z833 Family history of diabetes mellitus: Secondary | ICD-10-CM | POA: Insufficient documentation

## 2017-04-12 NOTE — Progress Notes (Signed)
Patient ID: [name], male    DOB: 1965/10/19  MRN: [id number]  CC: Annual Exam   Subjective: [name] is a 52 y.o. male who presents for f/u visiting.  Sister is with him His concerns today include:  Pt with hx of ETOH cirrhosis, tobacco dep, insomnia and hyperlipidemia  1.  ETOH Cirrhosis:  -Since last visit patient reports that he has been drinking a lot more and wants to get help.  He has tried outpatient things in the past like AA meetings and feels that they do not work for him.  At this point in time he feels he would need some type of detox  with long-term treatment facility.  When asked to quantify the amount that he is drinking patient states "quite a bit - beer and Vokda." -He has been depressed.  On last visit we were to refill the Remeron but look like I forgot to do so.  He is wanting refill on Ambien stating that he cannot sleep otherwise Attributes the depression and increased alcohol consumption the problems with his ex-wife and not being allowed to see his child   2.  Also c/o increase abdominal girth x 4 wks.  A lot of pain in abdomine and lower back. Very uncomfortable.  No N/V.  No LE edema Referral for liver  US on last visit but he never got the appointment. Never saw GI out-pt for his cirrhosis  Patient Active Problem List   Diagnosis Date Noted  . Hyperlipidemia 06/09/2016  . Insomnia 11/26/2014  . Liver cirrhosis ([place]) 09/21/2014  . Tobacco abuse 03/15/2011     Current Outpatient Medications on File Prior to Visit  Medication Sig Dispense Refill  . zolpidem (AMBIEN CR) 6.25 MG CR tablet Take 1 tablet (6.25 mg total) by mouth at bedtime as needed for sleep. 30 tablet 1  . lactulose (CHRONULAC) 10 GM/15ML solution Take 15 mLs (10 g total) by mouth 2 (two) times daily. (Patient not taking: Reported on 01/23/2017) 120 mL 0  . zolpidem (AMBIEN CR) 6.25 MG CR tablet Take 1 tablet (6.25 mg total) at bedtime as needed by mouth for sleep.  (Patient not taking: Reported on 04/12/2017) 30 tablet 0   No current facility-administered medications on file prior to visit.     No Known Allergies  Social History   Socioeconomic History  . Marital status: Legally Separated    Spouse name: Not on file  . Number of children: Not on file  . Years of education: Not on file  . Highest education level: Not on file  Social Needs  . Financial resource strain: Not on file  . Food insecurity - worry: Not on file  . Food insecurity - inability: Not on file  . Transportation needs - medical: Not on file  . Transportation needs - non-medical: Not on file  Occupational History  . Not on file  Tobacco Use  . Smoking status: Current Every Day Smoker    Packs/day: 0.50    Types: Cigarettes  . Smokeless tobacco: Never Used  Substance and Sexual Activity  . Alcohol use: Yes    Comment: pt states last drink was 3 days ago  . Drug use: No  . Sexual activity: No  Other Topics Concern  . Not on file  Social History Narrative  . Not on file    Family History  Problem Relation Age of Onset  . Coronary artery disease Unknown   . Diabetes type II Unknown  Past Surgical History:  Procedure Laterality Date  . NO PAST SURGERIES      ROS: Review of Systems Neg except as above PHYSICAL EXAM: BP 129/90   Pulse (!) 128   Temp 98.2 F (36.8 C) (Oral)   Resp 16   Ht 5\' 7"  (1.702 m)   Wt 144 lb 12.8 oz (65.7 kg)   SpO2 97%   BMI 22.68 kg/m   Physical Exam  General appearance - alert middle age male in NAD Mental status - flat affect Eyes -nonicteric sclera Chest - clear to auscultation, no wheezes, rales or rhonchi, symmetric air entryclear to auscultation, no wheezes, rales or rhonchi, symmetric air entry Heart - normal rate, regular rhythm, normal S1, S2, no murmurs, rubs, clicks or gallops Abdomen - spider veins. Tense ascites. Mild discomfort on palpation Extremities - no LE edema  ASSESSMENT AND PLAN: 1. Alcoholic  cirrhosis of liver with ascites ([place]) 2. Alcohol use disorder, severe, dependence ([place]) -Given the tense ascites with abdominal discomfort from it AND patient wanting acute detox then transfer to rehab facility, I recommended that he present to the emergency room.  Can get paracentesis done there then hopefully be transferred to a detox facility.   -advised to apply for [facility] discount so that we can refer to GI  -LCSW to see pt today  3. Depression, unspecified depression type Tried to discuss putting putting him on antidepressant but pt was more interested on being put back on Ambien. I am a bit skeptical about the Ambien at this time given the heavy ETOH intake and I have explained this to pt.  Not interested in Remeron as he feels it does not help him to sleep.  Pt was agreeable to going to ER today as discussed in #1 above so no labs done.  However, after sister left the room, pt reports he will go in a.m as he has to write some checks tonight.  Patient was given the opportunity to ask questions.  Patient verbalized understanding of the plan and was able to repeat key elements of the plan.   No orders of the defined types were placed in this encounter.    Requested Prescriptions    No prescriptions requested or ordered in this encounter    Return in about 2 weeks (around 04/26/2017).  [name], MD, FACP

## 2017-04-12 NOTE — Patient Instructions (Signed)
Be seen in ER to have fluid tapped off the abdomin and to be placed in detox program.

## 2017-04-12 NOTE — BH Specialist Note (Signed)
Integrated Behavioral Health Initial Visit  MRN: [id number] Name: [name]  Number of [place] Clinician visits:: 2/6 Session Start time: 4:25 PM  Session End time: 4:45 PM Total time: 20 minutes  Type of Service: [place] Interpretor:No. Interpretor Name and Language: N/A   Warm Hand Off Completed.       SUBJECTIVE: [name] is a 52 y.o. male accompanied by Sibling Patient was referred by Dr. Wynetta Emery for depression, anxiety, and substance use. Patient reports the following symptoms/concerns: increase in substance use, feelings of sadness and worry, difficulty sleeping, low energy, irritability, decreased concentration, feeling bad about self, irritability, and hx of suicidal ideations Duration of problem: Ongoing; Severity of problem: moderate  OBJECTIVE: Mood: Anxious and Affect: Depressed Risk of harm to self or others: No plan to harm self or others  LIFE CONTEXT: Family and Social: Pt reports support from family. He is accompanied at visit by his sister School/Work: Pt is self-employed. He does not receive any public benefits Self-Care: Pt reports smoking cigarettes (1.5 ppd) and drinking alcohol (beer and vodka) daily.  Life Changes: Pt has ongoing medical concerns and reports substance use. He is interested in substance use treatment  GOALS ADDRESSED: Patient will: 1. Reduce symptoms of: anxiety and depression 2. Increase knowledge and/or ability of: coping skills and healthy habits  3. Demonstrate ability to: Increase adequate support systems for patient/family, Increase motivation to adhere to plan of care and Decrease self-medicating behaviors  INTERVENTIONS: Interventions utilized: Solution-Focused Strategies, Supportive Counseling and Psychoeducation and/or Health Education  Standardized Assessments completed: GAD-7 and PHQ 2&9 with C-SSRS  ASSESSMENT: Patient currently experiencing  anxiety and depression triggered by ongoing medical concerns and an increase in substance use (alcohol) He reports feelings of sadness and worry, difficulty sleeping, low energy, irritability, decreased concentration, feeling bad about self, irritability, and hx of suicidal ideations. Pt denies current SI/HI/AVH. Safety plan was discussed and crisis resources provided   Patient may benefit from psychoeducation and psychotherapy. He participates in medication management through PCP. Yancey educated pt on how substance use can negatively impact one's mental and physical health. Pt is not interested in psychotherapy at this time; however, is open to substance use treatment. LCSWA provided family with local resources for detox and inpatient rehab. Family appreciative for assistance.     PLAN: 1. Follow up with behavioral health clinician on : Pt was encouraged to contact LCSWA if symptoms worsen or fail to improve to schedule behavioral appointments at [facility]. 2. Behavioral recommendations: LCSWA recommends that pt apply healthy coping skills discussed and participate in substance use treatment. Pt is encouraged to schedule follow up appointment with LCSWA 3. Referral(s): Substance Abuse Program 4. "From scale of 1-10, how likely are you to follow plan?": 9/10  Rebekah Chesterfield, LCSW 04/13/17 4:34 PM

## 2017-04-16 ENCOUNTER — Emergency Department (HOSPITAL_COMMUNITY): Payer: Self-pay

## 2017-04-16 ENCOUNTER — Inpatient Hospital Stay (HOSPITAL_COMMUNITY)
Admission: EM | Admit: 2017-04-16 | Discharge: 2017-04-20 | DRG: 432 | Disposition: A | Discharge status: Home / Self Care | Payer: Self-pay | Attending: Internal Medicine | Admitting: Internal Medicine

## 2017-04-16 ENCOUNTER — Encounter (HOSPITAL_COMMUNITY): Payer: Self-pay

## 2017-04-16 DIAGNOSIS — K921 Melena: Secondary | ICD-10-CM | POA: Diagnosis present

## 2017-04-16 DIAGNOSIS — J189 Pneumonia, unspecified organism: Secondary | ICD-10-CM | POA: Diagnosis present

## 2017-04-16 DIAGNOSIS — J9811 Atelectasis: Secondary | ICD-10-CM | POA: Diagnosis present

## 2017-04-16 DIAGNOSIS — R188 Other ascites: Secondary | ICD-10-CM

## 2017-04-16 DIAGNOSIS — K922 Gastrointestinal hemorrhage, unspecified: Secondary | ICD-10-CM

## 2017-04-16 DIAGNOSIS — F17210 Nicotine dependence, cigarettes, uncomplicated: Secondary | ICD-10-CM | POA: Diagnosis present

## 2017-04-16 DIAGNOSIS — I8510 Secondary esophageal varices without bleeding: Secondary | ICD-10-CM | POA: Diagnosis present

## 2017-04-16 DIAGNOSIS — D638 Anemia in other chronic diseases classified elsewhere: Secondary | ICD-10-CM | POA: Diagnosis present

## 2017-04-16 DIAGNOSIS — E161 Other hypoglycemia: Secondary | ICD-10-CM | POA: Diagnosis present

## 2017-04-16 DIAGNOSIS — F10929 Alcohol use, unspecified with intoxication, unspecified: Secondary | ICD-10-CM

## 2017-04-16 DIAGNOSIS — F10229 Alcohol dependence with intoxication, unspecified: Secondary | ICD-10-CM | POA: Diagnosis present

## 2017-04-16 DIAGNOSIS — K769 Liver disease, unspecified: Secondary | ICD-10-CM | POA: Diagnosis present

## 2017-04-16 DIAGNOSIS — K766 Portal hypertension: Secondary | ICD-10-CM | POA: Diagnosis present

## 2017-04-16 DIAGNOSIS — E876 Hypokalemia: Secondary | ICD-10-CM | POA: Diagnosis present

## 2017-04-16 DIAGNOSIS — R748 Abnormal levels of other serum enzymes: Secondary | ICD-10-CM | POA: Diagnosis present

## 2017-04-16 DIAGNOSIS — Y908 Blood alcohol level of 240 mg/100 ml or more: Secondary | ICD-10-CM | POA: Diagnosis present

## 2017-04-16 DIAGNOSIS — K7031 Alcoholic cirrhosis of liver with ascites: Principal | ICD-10-CM | POA: Diagnosis present

## 2017-04-16 DIAGNOSIS — Z79899 Other long term (current) drug therapy: Secondary | ICD-10-CM

## 2017-04-16 DIAGNOSIS — R000 Tachycardia, unspecified: Secondary | ICD-10-CM

## 2017-04-16 DIAGNOSIS — T473X5A Adverse effect of saline and osmotic laxatives, initial encounter: Secondary | ICD-10-CM | POA: Diagnosis not present

## 2017-04-16 DIAGNOSIS — E785 Hyperlipidemia, unspecified: Secondary | ICD-10-CM | POA: Diagnosis present

## 2017-04-16 DIAGNOSIS — T3695XA Adverse effect of unspecified systemic antibiotic, initial encounter: Secondary | ICD-10-CM | POA: Diagnosis not present

## 2017-04-16 DIAGNOSIS — K521 Toxic gastroenteritis and colitis: Secondary | ICD-10-CM | POA: Diagnosis not present

## 2017-04-16 DIAGNOSIS — I5031 Acute diastolic (congestive) heart failure: Secondary | ICD-10-CM | POA: Diagnosis present

## 2017-04-16 DIAGNOSIS — D649 Anemia, unspecified: Secondary | ICD-10-CM

## 2017-04-16 DIAGNOSIS — D62 Acute posthemorrhagic anemia: Secondary | ICD-10-CM | POA: Diagnosis present

## 2017-04-16 LAB — URINALYSIS, ROUTINE W REFLEX MICROSCOPIC
Bilirubin Urine: NEGATIVE
GLUCOSE, UA: NEGATIVE mg/dL
HGB URINE DIPSTICK: NEGATIVE
KETONES UR: 80 mg/dL — AB
Leukocytes, UA: NEGATIVE
Nitrite: NEGATIVE
PROTEIN: NEGATIVE mg/dL
Specific Gravity, Urine: 1.016 (ref 1.005–1.030)
pH: 6 (ref 5.0–8.0)

## 2017-04-16 LAB — CBC
HEMATOCRIT: 28.1 % — AB (ref 39.0–52.0)
Hemoglobin: 8.6 g/dL — ABNORMAL LOW (ref 13.0–17.0)
MCH: 29 pg (ref 26.0–34.0)
MCHC: 30.6 g/dL (ref 30.0–36.0)
MCV: 94.6 fL (ref 78.0–100.0)
Platelets: 486 10*3/uL — ABNORMAL HIGH (ref 150–400)
RBC: 2.97 MIL/uL — ABNORMAL LOW (ref 4.22–5.81)
RDW: 20.5 % — AB (ref 11.5–15.5)
WBC: 17.6 10*3/uL — AB (ref 4.0–10.5)

## 2017-04-16 LAB — PREPARE RBC (CROSSMATCH)

## 2017-04-16 LAB — BASIC METABOLIC PANEL
ANION GAP: 20 — AB (ref 5–15)
BUN: <5 mg/dL — ABNORMAL LOW (ref 6–20)
CALCIUM: 8 mg/dL — AB (ref 8.9–10.3)
CHLORIDE: 98 mmol/L — AB (ref 101–111)
CO2: 19 mmol/L — AB (ref 22–32)
Creatinine, Ser: 0.65 mg/dL (ref 0.61–1.24)
GFR calc Af Amer: >60 mL/min (ref >60)
GFR calc non Af Amer: >60 mL/min (ref >60)
GLUCOSE: 80 mg/dL (ref 65–99)
POTASSIUM: 3.8 mmol/L (ref 3.5–5.1)
Sodium: 137 mmol/L (ref 135–145)

## 2017-04-16 LAB — ABO/RH: ABO/RH(D): O POS

## 2017-04-16 LAB — ETHANOL: Alcohol, Ethyl (B): 321 mg/dL (ref <10)

## 2017-04-16 LAB — POC OCCULT BLOOD, ED: FECAL OCCULT BLD: POSITIVE — AB

## 2017-04-16 LAB — AMMONIA: Ammonia: 44 umol/L — ABNORMAL HIGH (ref 9–35)

## 2017-04-16 MED ORDER — SODIUM CHLORIDE 0.9 % IV SOLN
1.0000 g | INTRAVENOUS | Status: DC
  Administered 2017-04-16 – 2017-04-19 (×4): 1 g via INTRAVENOUS
  Filled 2017-04-16 (×4): qty 10

## 2017-04-16 MED ORDER — SODIUM CHLORIDE 0.9 % IV SOLN
80.0000 mg | Freq: Once | INTRAVENOUS | Status: AC
  Administered 2017-04-16: 23:00:00 80 mg via INTRAVENOUS
  Filled 2017-04-16: qty 80

## 2017-04-16 MED ORDER — ONDANSETRON HCL 4 MG/2ML IJ SOLN
4.0000 mg | Freq: Four times a day (QID) | INTRAMUSCULAR | Status: DC | PRN
  Administered 2017-04-16 – 2017-04-18 (×2): 4 mg via INTRAVENOUS
  Filled 2017-04-16 (×2): qty 2

## 2017-04-16 MED ORDER — THIAMINE HCL 100 MG/ML IJ SOLN
100.0000 mg | Freq: Every day | INTRAMUSCULAR | Status: DC
  Administered 2017-04-17: 100 mg via INTRAVENOUS
  Filled 2017-04-16: qty 2

## 2017-04-16 MED ORDER — SODIUM CHLORIDE 0.9 % IV SOLN
50.0000 ug/h | INTRAVENOUS | Status: DC
  Administered 2017-04-16: 50 ug/h via INTRAVENOUS
  Filled 2017-04-16 (×3): qty 1

## 2017-04-16 MED ORDER — SODIUM CHLORIDE 0.9 % IV SOLN
8.0000 mg/h | INTRAVENOUS | Status: DC
  Administered 2017-04-17 – 2017-04-18 (×3): 8 mg/h via INTRAVENOUS
  Filled 2017-04-16 (×6): qty 80

## 2017-04-16 MED ORDER — AZITHROMYCIN 500 MG IV SOLR
500.0000 mg | INTRAVENOUS | Status: DC
  Administered 2017-04-17 – 2017-04-19 (×4): 500 mg via INTRAVENOUS
  Filled 2017-04-16 (×4): qty 500

## 2017-04-16 MED ORDER — LORAZEPAM 2 MG/ML IJ SOLN: 2.0000 mg | INTRAMUSCULAR | Status: DC | PRN

## 2017-04-16 MED ORDER — OCTREOTIDE LOAD VIA INFUSION
50.0000 ug | Freq: Once | INTRAVENOUS | Status: AC
  Administered 2017-04-16: 50 ug via INTRAVENOUS
  Filled 2017-04-16: qty 25

## 2017-04-16 MED ORDER — ZOLPIDEM TARTRATE 5 MG PO TABS
5.0000 mg | ORAL_TABLET | Freq: Every evening | ORAL | Status: DC | PRN
  Administered 2017-04-17 – 2017-04-19 (×3): 5 mg via ORAL
  Filled 2017-04-16 (×4): qty 1

## 2017-04-16 MED ORDER — OCTREOTIDE LOAD VIA INFUSION
50.0000 ug | Freq: Once | INTRAVENOUS | Status: DC
  Filled 2017-04-16: qty 25

## 2017-04-16 MED ORDER — SODIUM CHLORIDE 0.9 % IV SOLN
INTRAVENOUS | Status: DC
  Administered 2017-04-16: 23:00:00 via INTRAVENOUS

## 2017-04-16 MED ORDER — SODIUM CHLORIDE 0.9 % IV SOLN: 50.0000 ug/h | INTRAVENOUS | Status: DC

## 2017-04-16 MED ORDER — LACTULOSE 10 GM/15ML PO SOLN
10.0000 g | Freq: Two times a day (BID) | ORAL | Status: DC
  Administered 2017-04-17 – 2017-04-18 (×3): 10 g via ORAL
  Filled 2017-04-16 (×4): qty 15

## 2017-04-16 MED ORDER — SODIUM CHLORIDE 0.9 % IV SOLN: Freq: Once | INTRAVENOUS | Status: DC

## 2017-04-16 MED ORDER — ACETAMINOPHEN 325 MG PO TABS
650.0000 mg | ORAL_TABLET | Freq: Once | ORAL | Status: AC
  Administered 2017-04-17: 650 mg via ORAL
  Filled 2017-04-16: qty 2

## 2017-04-16 MED ORDER — FOLIC ACID 5 MG/ML IJ SOLN
1.0000 mg | Freq: Every day | INTRAMUSCULAR | Status: DC
  Filled 2017-04-16: qty 0.2

## 2017-04-16 MED ORDER — DIPHENHYDRAMINE HCL 50 MG/ML IJ SOLN
25.0000 mg | Freq: Once | INTRAMUSCULAR | Status: AC
  Administered 2017-04-17: 25 mg via INTRAVENOUS
  Filled 2017-04-16: qty 1

## 2017-04-16 NOTE — H&P (Signed)
TRH H&P   Patient Demographics:    [name], is a 52 y.o. male  MRN: [id number]   DOB - January 12, 1966  Admit Date - 04/16/2017  Outpatient Primary MD for the patient is [name], MD  Referring MD/NP/PA: Julianne Rice  Outpatient Specialists:   Patient coming from: home  Chief Complaint  Patient presents with  . abdominal swelling/cirrhosis      HPI:    [name]  is a 52 y.o. male, w alcoholism, cirrhosis c/o abdominal distention x 4 weeks.  Pt notes slight abdominal discomfort mostly lower abdomen which has been increasing recently . Slight nausea.  No emesis.  Pt notes black stool for the past 3 days.   Pt denies fever, chills, cough, cp, palp, sob, diarrhea, brbpr.  Pt presented due to increase in abdominal distention and discomfort.  Pt does not taking some ibuprofen prn.   In ED,   CXR  IMPRESSION: Left lower lobe infiltrate.  Bibasilar atelectasis.  Wbc 17.6, Hgb 8.6, Plt 486 FOBT + Na 137, K 3.8, Bun <5, Creatinine 0.65 Ammonia 44  GI consulted by ED for ? GI bleeding r/o esophageal varices, gastritis, PUD  Pt will be admitted for Abdominal pain, distention as well as black stool and new onset Anemia.    Review of systems:    In addition to the HPI above,   No Fever-chills, No Headache, No changes with Vision or hearing, No problems swallowing food or Liquids, No Chest pain, Cough or Shortness of Breath, No Vommitting No Blood in  Urine, No dysuria, No new skin rashes or bruises, No new joints pains-aches,  No new weakness, tingling, numbness in any extremity, No recent weight gain or loss, No polyuria, polydypsia or polyphagia, No significant Mental Stressors.  A full 10 point Review of Systems was done, except as stated above, all other Review of Systems were negative.   With Past History of the following :     Past Medical History:  Diagnosis Date  . Alcoholism ([place])   . Cirrhosis ([place])   . Hyperlipemia       Past Surgical History:  Procedure Laterality Date  . NO PAST SURGERIES        Social History:     Social History   Tobacco Use  . Smoking status: Current Every Day Smoker    Packs/day: 0.50    Types: Cigarettes  . Smokeless tobacco: Never Used  Substance Use Topics  . Alcohol use: Yes    Comment: pt states last drink was 3 days ago     Lives - at home  Mobility - walks by self   Family History :     Family History  Problem Relation Age of Onset  . Coronary artery disease Unknown   . Diabetes type II Unknown       Home Medications:   Prior to  Admission medications   Medication Sig Start Date End Date Taking? Authorizing Provider  lactulose (CHRONULAC) 10 GM/15ML solution Take 15 mLs (10 g total) by mouth 2 (two) times daily. 06/27/16   Funches, Adriana Mccallum, MD  zolpidem (AMBIEN CR) 6.25 MG CR tablet Take 1 tablet (6.25 mg total) at bedtime as needed by mouth for sleep. 01/05/17   [name], MD  zolpidem (AMBIEN CR) 6.25 MG CR tablet Take 1 tablet (6.25 mg total) by mouth at bedtime as needed for sleep. 02/02/17   [name], MD     Allergies:    No Known Allergies   Physical Exam:   Vitals  Blood pressure 110/75, pulse (!) 120, temperature 98 F (36.7 C), temperature source Oral, resp. rate 18, SpO2 95 %.   1. General  lying in bed in NAD,    2. Normal affect and insight, Not Suicidal or Homicidal, Awake Alert, Oriented X 3.  3. No F.N deficits, ALL C.Nerves Intact, Strength 5/5 all 4 extremities, Sensation intact all 4 extremities, Plantars down going.  4. Ears and Eyes appear Normal, Conjunctivae clear, PERRLA. Moist Oral Mucosa.  5. Supple Neck, No JVD, No cervical lymphadenopathy appriciated, No Carotid Bruits.  6. Symmetrical Chest wall movement, Good air movement bilaterally, slight crackles left lung base, no wheezing  7.  Tachy s1, s2 no m/g/r  8. Abdominal distension, umbilical hernia,   Positive Bowel Sounds, Abdomen Soft, No tenderness, No organomegaly appriciated,No rebound -guarding or rigidity.  9.  No Cyanosis, Normal Skin Turgor, No Skin Rash or Bruise.  10. Good muscle tone,  joints appear normal , no effusions, Normal ROM.  11. No Palpable Lymph Nodes in Neck or Axillae     Data Review:    CBC Recent Labs  Lab 04/16/17 1540  WBC 17.6*  HGB 8.6*  HCT 28.1*  PLT 486*  MCV 94.6  MCH 29.0  MCHC 30.6  RDW 20.5*   ------------------------------------------------------------------------------------------------------------------  Chemistries  Recent Labs  Lab 04/16/17 1540  NA 137  K 3.8  CL 98*  CO2 19*  GLUCOSE 80  BUN <5*  CREATININE 0.65  CALCIUM 8.0*   ------------------------------------------------------------------------------------------------------------------ estimated creatinine clearance is 99.2 mL/min (by C-G formula based on SCr of 0.65 mg/dL). ------------------------------------------------------------------------------------------------------------------ No results for input(s): TSH, T4TOTAL, T3FREE, THYROIDAB in the last 72 hours.  Invalid input(s): FREET3  Coagulation profile No results for input(s): INR, PROTIME in the last 168 hours. ------------------------------------------------------------------------------------------------------------------- No results for input(s): DDIMER in the last 72 hours. -------------------------------------------------------------------------------------------------------------------  Cardiac Enzymes No results for input(s): CKMB, TROPONINI, MYOGLOBIN in the last 168 hours.  Invalid input(s): CK ------------------------------------------------------------------------------------------------------------------ No results found for:  BNP   ---------------------------------------------------------------------------------------------------------------  Urinalysis    Component Value Date/Time   COLORURINE YELLOW 06/24/2015 2016   APPEARANCEUR CLEAR 06/24/2015 2016   LABSPEC 1.003 (L) 06/24/2015 2016   PHURINE 5.5 06/24/2015 2016   GLUCOSEU NEGATIVE 06/24/2015 2016   HGBUR NEGATIVE 06/24/2015 2016   BILIRUBINUR NEGATIVE 06/24/2015 2016   KETONESUR NEGATIVE 06/24/2015 2016   PROTEINUR NEGATIVE 06/24/2015 2016   UROBILINOGEN 1.0 10/20/2014 1026   NITRITE NEGATIVE 06/24/2015 2016   LEUKOCYTESUR NEGATIVE 06/24/2015 2016    ----------------------------------------------------------------------------------------------------------------   Imaging Results:    Dg Chest 2 View  Result Date: 04/16/2017 CLINICAL DATA:  Patient here with increasing SOB and abdominal swelling x 2-3 weeks. Patient has cirrhosis and continuing to drink ETOH. Alert but slow to respond. Skin with yellowish tent. No chest hx per pt. EXAM: CHEST  2 VIEW  COMPARISON:  06/25/2015 FINDINGS: Heart size is normal. There is linear and patchy density at both lung bases, more confluent at the left. Small left pleural effusion is present. No pulmonary edema. IMPRESSION: Left lower lobe infiltrate.  Bibasilar atelectasis. Electronically Signed   By: Nolon Nations M.D.   On: 04/16/2017 17:09      Assessment & Plan:    Principal Problem:   Anemia Active Problems:   Community acquired pneumonia   Alcohol intoxication ([place])   Melena   Tachycardia    Black stool ? UGI bleeding NPO protonix 80mg  iv x1, then 8mg / hour Octreotide GI consulted called by ED, appreciate input  Abdominal pain CT scan abd, pelvis Check lipase  Abdominal distention/ Ascites IR paracentesis in AM  CAP Rocephin, zithromax iv  Tachycardia Check cbc  Check trop If not improving consider further w/up with  CTA chest, cardiac echo  Anemia Transfuse 2 units prbc in  setting of GI bleeding  ETOH intoxication/dependence CIWA Pt counselled on cessation  Nausea Zofran      DVT Prophylaxis  SCDs  AM Labs Ordered, also please review Full Orders  Family Communication: Admission, patients condition and plan of care including tests being ordered have been discussed with the patient who indicate understanding and agree with the plan and Code Status.  Code Status FULL CODE  Likely DC to home  Condition GUARDED    Consults called:  GI per ED  Admission status: inpatient  Time spent in minutes : 45   Jani Gravel M.D on 04/16/2017 at 8:35 PM  Between 7am to 7pm - Pager - [phone]   After 7pm go to [email or web address] - password [facility]  [facility] - Office  [phone]

## 2017-04-16 NOTE — ED Triage Notes (Signed)
Patient here with increasing SOB and abdominal swelling x 2-3 weeks. Patient has cirrhosis and continuing to drink ETOH. Alert but slow to respond. Skin with yellowish tent.

## 2017-04-16 NOTE — ED Notes (Signed)
Lab called with critical ethanol 321

## 2017-04-16 NOTE — ED Notes (Signed)
Pt was called twice and no answer at this time

## 2017-04-16 NOTE — ED Notes (Signed)
Pt allowed this RN to collect occult sample, rectal vault empty, small amount brown stool, sample on card to lab

## 2017-04-16 NOTE — ED Provider Notes (Addendum)
[facility] EMERGENCY DEPARTMENT Provider Note   CSN: [id number] Arrival date & time: 04/16/17  1423     History   Chief Complaint Chief Complaint  Patient presents with  . abdominal swelling/cirrhosis    HPI [name] is a 52 y.o. male.  HPI Patient with history of alcoholic cirrhosis and ascites presents with several weeks of worsening abdominal distention.  States he has some discomfort with this.  Patient denies to me any shortness of breath.  Denies any chest pain.  He had no fever or chills.  Continues to drink alcohol.  Does admit to dark colored stools for the last few days. Past Medical History:  Diagnosis Date  . Alcoholism ([place])   . Cirrhosis ([place])   . Hyperlipemia     Patient Active Problem List   Diagnosis Date Noted  . Community acquired pneumonia 04/16/2017  . Alcohol intoxication ([place]) 04/16/2017  . Melena 04/16/2017  . Tachycardia 04/16/2017  . Hyperlipidemia 06/09/2016  . Anemia 11/27/2014  . Insomnia 11/26/2014  . Liver cirrhosis ([place]) 09/21/2014  . Tobacco abuse 03/15/2011    Past Surgical History:  Procedure Laterality Date  . NO PAST SURGERIES         Home Medications    Prior to Admission medications   Medication Sig Start Date End Date Taking? Authorizing Provider  lactulose (CHRONULAC) 10 GM/15ML solution Take 15 mLs (10 g total) by mouth 2 (two) times daily. 06/27/16   Funches, Adriana Mccallum, MD  zolpidem (AMBIEN CR) 6.25 MG CR tablet Take 1 tablet (6.25 mg total) at bedtime as needed by mouth for sleep. 01/05/17   [name], MD  zolpidem (AMBIEN CR) 6.25 MG CR tablet Take 1 tablet (6.25 mg total) by mouth at bedtime as needed for sleep. 02/02/17   [name], MD    Family History Family History  Problem Relation Age of Onset  . Coronary artery disease Unknown   . Diabetes type II Unknown     Social History Social History   Tobacco Use  . Smoking status: Current Every Day Smoker   Packs/day: 0.50    Types: Cigarettes  . Smokeless tobacco: Never Used  Substance Use Topics  . Alcohol use: Yes    Comment: pt states last drink was 3 days ago  . Drug use: No     Allergies   Patient has no known allergies.   Review of Systems Review of Systems  Constitutional: Positive for fatigue. Negative for chills and fever.  HENT: Negative for trouble swallowing.   Respiratory: Negative for cough and shortness of breath.   Cardiovascular: Negative for chest pain, palpitations and leg swelling.  Gastrointestinal: Positive for abdominal distention and abdominal pain. Negative for constipation, diarrhea, nausea and vomiting.  Genitourinary: Negative for dysuria, flank pain, frequency and hematuria.  Musculoskeletal: Negative for back pain, myalgias and neck pain.  Skin: Negative for rash and wound.  Neurological: Negative for dizziness, weakness, light-headedness, numbness and headaches.  All other systems reviewed and are negative.    Physical Exam Updated Vital Signs BP 110/75   Pulse (!) 120   Temp 98 F (36.7 C) (Oral)   Resp 18   SpO2 95%   Physical Exam  Constitutional: He is oriented to person, place, and time. He appears well-developed. No distress.  HENT:  Head: Normocephalic and atraumatic.  Mouth/Throat: Oropharynx is clear and moist. No oropharyngeal exudate.  Eyes: EOM are normal. Pupils are equal, round, and reactive to light.  Pupils  are 4 mm and reactive  Neck: Normal range of motion. Neck supple. No JVD present.  Cardiovascular: Normal rate and regular rhythm. Exam reveals no gallop and no friction rub.  No murmur heard. Pulmonary/Chest: Effort normal. He has rales.  Few rales in left base.  Abdominal: Soft. Bowel sounds are normal. He exhibits distension. There is no tenderness. There is no rebound and no guarding. A hernia is present.  Abdomen is distended.  Shifting dullness.  Soft umbilical hernia.  Very mild diffuse tenderness without  rebound or guarding.  Musculoskeletal: Normal range of motion. He exhibits no edema or tenderness.  Lymphadenopathy:    He has no cervical adenopathy.  Neurological: He is alert and oriented to person, place, and time.  Moves all extremities without deficit.  Sensation intact.  Skin: Skin is warm and dry. Capillary refill takes less than 2 seconds. No rash noted. He is not diaphoretic. No erythema.  Very mild jaundice  Psychiatric: He has a normal mood and affect. His behavior is normal.  Nursing note and vitals reviewed.    ED Treatments / Results  Labs (all labs ordered are listed, but only abnormal results are displayed) Labs Reviewed  CBC - Abnormal; Notable for the following components:      Result Value   WBC 17.6 (*)    RBC 2.97 (*)    Hemoglobin 8.6 (*)    HCT 28.1 (*)    RDW 20.5 (*)    Platelets 486 (*)    All other components within normal limits  BASIC METABOLIC PANEL - Abnormal; Notable for the following components:   Chloride 98 (*)    CO2 19 (*)    BUN <5 (*)    Calcium 8.0 (*)    Anion gap 20 (*)    All other components within normal limits  ETHANOL - Abnormal; Notable for the following components:   Alcohol, Ethyl (B) 321 (*)    All other components within normal limits  AMMONIA - Abnormal; Notable for the following components:   Ammonia 44 (*)    All other components within normal limits  POC OCCULT BLOOD, ED - Abnormal; Notable for the following components:   Fecal Occult Bld POSITIVE (*)    All other components within normal limits  HEPATIC FUNCTION PANEL  PROTIME-INR  URINALYSIS, ROUTINE W REFLEX MICROSCOPIC  TYPE AND SCREEN  ABO/RH    EKG  EKG Interpretation  Date/Time:  Monday April 16 2017 19:22:55 EST Ventricular Rate:  121 PR Interval:    QRS Duration: 69 QT Interval:  353 QTC Calculation: 501 R Axis:   27 Text Interpretation:  Sinus tachycardia Low voltage, precordial leads Borderline T wave abnormalities Prolonged QT interval  Confirmed by Julianne Rice [phone]) on 04/16/2017 7:52:07 PM       Radiology Dg Chest 2 View  Result Date: 04/16/2017 CLINICAL DATA:  Patient here with increasing SOB and abdominal swelling x 2-3 weeks. Patient has cirrhosis and continuing to drink ETOH. Alert but slow to respond. Skin with yellowish tent. No chest hx per pt. EXAM: CHEST  2 VIEW COMPARISON:  06/25/2015 FINDINGS: Heart size is normal. There is linear and patchy density at both lung bases, more confluent at the left. Small left pleural effusion is present. No pulmonary edema. IMPRESSION: Left lower lobe infiltrate.  Bibasilar atelectasis. Electronically Signed   By: Nolon Nations M.D.   On: 04/16/2017 17:09    Procedures .Critical Care Performed by: Julianne Rice, MD Authorized by: Julianne Rice, MD  Critical care provider statement:    Critical care time (minutes):  35   Critical care start time:  04/16/2017 7:45 PM   Critical care end time:  04/16/2017 8:54 PM   Critical care time was exclusive of:  Separately billable procedures and treating other patients   Critical care was necessary to treat or prevent imminent or life-threatening deterioration of the following conditions:  Hepatic failure   Critical care was time spent personally by me on the following activities:  Blood draw for specimens, development of treatment plan with patient or surrogate, discussions with consultants, re-evaluation of patient's condition, review of old charts, pulse oximetry, ordering and review of radiographic studies and ordering and review of laboratory studies    (including critical care time)  Medications Ordered in ED Medications  pantoprazole (PROTONIX) 80 mg in sodium chloride 0.9 % 100 mL IVPB (not administered)  pantoprazole (PROTONIX) 80 mg in sodium chloride 0.9 % 250 mL (0.32 mg/mL) infusion (not administered)  octreotide (SANDOSTATIN) 2 mcg/mL load via infusion 50 mcg (not administered)     Initial Impression /  Assessment and Plan / ED Course  I have reviewed the triage vital signs and the nursing notes.  Pertinent labs & imaging results that were available during my care of the patient were reviewed by me and considered in my medical decision making (see chart for details).    Hemoccult positive.  Patient with anemia likely secondary to gastrointestinal bleeding.  Given history of cirrhosis concern for possible variceal bleeding versus gastritis.  Started on Protonix and octreotide.  Blood pressure is stable.  Discussed with hospitalist who will see patient in the emergency department.  We will consult gastroenterology.  Patient will also need to be on CIWA protocol given history of chronic alcoholism.  Discussed with lower gastroenterology who will consult on patient in the morning. Final Clinical Impressions(s) / ED Diagnoses   Final diagnoses:  Ascites due to alcoholic cirrhosis [facility])  Gastrointestinal hemorrhage, unspecified gastrointestinal hemorrhage type    ED Discharge Orders    None       Julianne Rice, MD 04/16/17 2054    Julianne Rice, MD 04/16/17 2119

## 2017-04-16 NOTE — ED Notes (Signed)
Pt reports having nausea, pt 02 sat 77-80 on RA with good pleth, pt denies shob, pt placed on 2L [place], 02 sat up to 95%

## 2017-04-17 ENCOUNTER — Encounter (HOSPITAL_COMMUNITY): Payer: Self-pay | Admitting: Emergency Medicine

## 2017-04-17 ENCOUNTER — Inpatient Hospital Stay (HOSPITAL_COMMUNITY): Payer: Self-pay

## 2017-04-17 DIAGNOSIS — F10920 Alcohol use, unspecified with intoxication, uncomplicated: Secondary | ICD-10-CM

## 2017-04-17 DIAGNOSIS — K7031 Alcoholic cirrhosis of liver with ascites: Principal | ICD-10-CM

## 2017-04-17 DIAGNOSIS — R195 Other fecal abnormalities: Secondary | ICD-10-CM

## 2017-04-17 DIAGNOSIS — K921 Melena: Secondary | ICD-10-CM

## 2017-04-17 DIAGNOSIS — D649 Anemia, unspecified: Secondary | ICD-10-CM

## 2017-04-17 HISTORY — PX: IR PARACENTESIS: IMG2679

## 2017-04-17 LAB — CBC
HCT: 23.7 % — ABNORMAL LOW (ref 39.0–52.0)
HCT: 33.2 % — ABNORMAL LOW (ref 39.0–52.0)
HEMOGLOBIN: 10.5 g/dL — AB (ref 13.0–17.0)
Hemoglobin: 7.4 g/dL — ABNORMAL LOW (ref 13.0–17.0)
MCH: 29.5 pg (ref 26.0–34.0)
MCH: 29 pg (ref 26.0–34.0)
MCHC: 31.2 g/dL (ref 30.0–36.0)
MCHC: 31.6 g/dL (ref 30.0–36.0)
MCV: 94.4 fL (ref 78.0–100.0)
MCV: 91.7 fL (ref 78.0–100.0)
PLATELETS: 330 10*3/uL (ref 150–400)
PLATELETS: 233 10*3/uL (ref 150–400)
RBC: 2.51 MIL/uL — ABNORMAL LOW (ref 4.22–5.81)
RBC: 3.62 MIL/uL — AB (ref 4.22–5.81)
RDW: 20.1 % — AB (ref 11.5–15.5)
RDW: 19 % — ABNORMAL HIGH (ref 11.5–15.5)
WBC: 15.4 10*3/uL — AB (ref 4.0–10.5)
WBC: 9.2 10*3/uL (ref 4.0–10.5)

## 2017-04-17 LAB — PROTEIN, PLEURAL OR PERITONEAL FLUID

## 2017-04-17 LAB — CBG MONITORING, ED
GLUCOSE-CAPILLARY: 55 mg/dL — AB (ref 65–99)
GLUCOSE-CAPILLARY: 41 mg/dL — AB (ref 65–99)
Glucose-Capillary: 40 mg/dL — CL (ref 65–99)
Glucose-Capillary: 162 mg/dL — ABNORMAL HIGH (ref 65–99)

## 2017-04-17 LAB — ALBUMIN, PLEURAL OR PERITONEAL FLUID: Albumin, Fluid: <1 g/dL

## 2017-04-17 LAB — COMPREHENSIVE METABOLIC PANEL
ALT: 21 U/L (ref 17–63)
AST: 91 U/L — AB (ref 15–41)
Albumin: 2.6 g/dL — ABNORMAL LOW (ref 3.5–5.0)
Alkaline Phosphatase: 259 U/L — ABNORMAL HIGH (ref 38–126)
Anion gap: 20 — ABNORMAL HIGH (ref 5–15)
BILIRUBIN TOTAL: 1.2 mg/dL (ref 0.3–1.2)
CO2: 18 mmol/L — ABNORMAL LOW (ref 22–32)
CREATININE: 0.57 mg/dL — AB (ref 0.61–1.24)
Calcium: 7.5 mg/dL — ABNORMAL LOW (ref 8.9–10.3)
Chloride: 99 mmol/L — ABNORMAL LOW (ref 101–111)
GFR calc Af Amer: >60 mL/min (ref >60)
GLUCOSE: 43 mg/dL — AB (ref 65–99)
Potassium: 3.3 mmol/L — ABNORMAL LOW (ref 3.5–5.1)
Sodium: 137 mmol/L (ref 135–145)
TOTAL PROTEIN: 6.3 g/dL — AB (ref 6.5–8.1)

## 2017-04-17 LAB — TROPONIN I: Troponin I: <0.03 ng/mL (ref <0.03)

## 2017-04-17 LAB — BODY FLUID CELL COUNT WITH DIFFERENTIAL
Eos, Fluid: 0 %
LYMPHS FL: 12 %
MONOCYTE-MACROPHAGE-SEROUS FLUID: 83 % (ref 50–90)
Neutrophil Count, Fluid: 5 % (ref 0–25)
WBC FLUID: 128 uL (ref 0–1000)

## 2017-04-17 LAB — GRAM STAIN

## 2017-04-17 LAB — HIV ANTIBODY (ROUTINE TESTING W REFLEX): HIV Screen 4th Generation wRfx: NONREACTIVE

## 2017-04-17 LAB — MRSA PCR SCREENING: MRSA BY PCR: NEGATIVE

## 2017-04-17 LAB — LIPASE, BLOOD: LIPASE: 25 U/L (ref 11–51)

## 2017-04-17 MED ORDER — DEXTROSE 50 % IV SOLN
12.5000 g | Freq: Once | INTRAVENOUS | Status: AC
  Administered 2017-04-17: 12.5 g via INTRAVENOUS
  Filled 2017-04-17: qty 50

## 2017-04-17 MED ORDER — DEXTROSE-NACL 5-0.9 % IV SOLN
INTRAVENOUS | Status: DC
  Administered 2017-04-17: 07:00:00 via INTRAVENOUS

## 2017-04-17 MED ORDER — POTASSIUM CHLORIDE CRYS ER 20 MEQ PO TBCR
40.0000 meq | EXTENDED_RELEASE_TABLET | Freq: Once | ORAL | Status: AC
  Administered 2017-04-17: 40 meq via ORAL
  Filled 2017-04-17: qty 2

## 2017-04-17 MED ORDER — ACETAMINOPHEN 500 MG PO TABS
500.0000 mg | ORAL_TABLET | Freq: Four times a day (QID) | ORAL | Status: DC | PRN
  Administered 2017-04-19: 500 mg via ORAL
  Filled 2017-04-17: qty 1

## 2017-04-17 MED ORDER — MORPHINE SULFATE (PF) 2 MG/ML IV SOLN
1.0000 mg | INTRAVENOUS | Status: DC | PRN
  Administered 2017-04-17: 1 mg via INTRAVENOUS
  Administered 2017-04-17 – 2017-04-20 (×7): 2 mg via INTRAVENOUS
  Filled 2017-04-17 (×9): qty 1

## 2017-04-17 MED ORDER — LIDOCAINE 2% (20 MG/ML) 5 ML SYRINGE
INTRAMUSCULAR | Status: AC
  Filled 2017-04-17: qty 10

## 2017-04-17 MED ORDER — LIDOCAINE HCL (PF) 1 % IJ SOLN
INTRAMUSCULAR | Status: DC | PRN
  Administered 2017-04-17: 10 mL

## 2017-04-17 MED ORDER — VITAMIN B-1 100 MG PO TABS
100.0000 mg | ORAL_TABLET | Freq: Every day | ORAL | Status: DC
  Administered 2017-04-18 – 2017-04-20 (×2): 100 mg via ORAL
  Filled 2017-04-17 (×4): qty 1

## 2017-04-17 MED ORDER — FOLIC ACID 1 MG PO TABS
1.0000 mg | ORAL_TABLET | Freq: Every day | ORAL | Status: DC
  Administered 2017-04-18 – 2017-04-20 (×2): 1 mg via ORAL
  Filled 2017-04-17 (×4): qty 1

## 2017-04-17 MED ORDER — DEXTROSE 50 % IV SOLN
25.0000 g | Freq: Once | INTRAVENOUS | Status: AC
  Administered 2017-04-17: 25 g via INTRAVENOUS
  Filled 2017-04-17: qty 50

## 2017-04-17 MED ORDER — LORAZEPAM 1 MG PO TABS
2.0000 mg | ORAL_TABLET | ORAL | Status: DC | PRN
Start: 2017-04-17 — End: 2017-04-20
  Administered 2017-04-19 – 2017-04-20 (×3): 2 mg via ORAL
  Filled 2017-04-17 (×3): qty 2

## 2017-04-17 NOTE — ED Notes (Signed)
inpt team paged, CBG 41, pt has no other complaints other than requesting beverage

## 2017-04-17 NOTE — ED Notes (Signed)
Patient CBG was 162, Nurse Lexine Baton was informed.

## 2017-04-17 NOTE — ED Notes (Addendum)
House Coverage, MD messaged about CBG. Autumn, RN informed. Will continue to monitor. Pt in NAD.

## 2017-04-17 NOTE — ED Notes (Signed)
inpt coverage notified of BG of 40, orders received

## 2017-04-17 NOTE — ED Notes (Signed)
Pt returned from CT,. 

## 2017-04-17 NOTE — ED Notes (Signed)
Attempted report to [phone]. RN unavailable. Will call back.

## 2017-04-17 NOTE — Consult Note (Addendum)
Referring Provider: [facility]    Primary Care Physician:  [name], MD Primary Gastroenterologist:  unassigned  Reason for Consultation: cirrhosis, dark stool  ASSESSMENT AND PLAN:    55. 52 yo male with decompensated cirrhosis, presumably ETOH related. Black stools at home. Stool FOBT+. Just had BM, not black. Still drinking. He has ascites, platelets and coags okay. Mentation okay.  -octreotide was started by ED staff but he apparently had a reaction to it.  -continue PPI gtt -probable EGD tomorrow, he can have clears today.  -LVP was ordered, will make sure fluid studies ordered.  -continue lactulose -continue rocephin for SBP prophylaxis. -will need additional labs for cirrhosis workup to exclude causes other than alcohol (this can be done outpatient).  -liver lesions on non-contrast CT scan, will need MRI at some point. Will obtain AFP  -eventual HBV, HAV vaccinations  2. Anemia of acute blood loss. Hgb nearly 15 in April, down to 7.4. He has just received 2 units of blood.  -monitor cbc, give more blood if needed  3. Ongoing ETOH abuse  4. LLL infiltrate.On Zithromax   HPI: [name] is a 52 y.o. male with cirrhosis complicated by portal HTN with ascites. He has a hx of ETOH abuse. He was admitted in 2016 and 2017 with decompensated cirrhosis. Not seen inpatient by GI, apparently referred to [place] but didn't make appt.  In 2016 his viral hep studies negative were negative. AMA negative. Ascitic fluid cytology negative for malignancy.   Patient presented to ED yesterday with abdominal distention x 4 weeks associated with mild lower abdominal discomfort and mild nausea without vomiting. He has also been having black stools for a few days. Having a few BMs a day but small amounts. Takes Advil a couple of times a week. He complains of lower abdominal discomfort unrelated to meals or defecation. No fevers. No SOB, chest pain or dizziness.    In ED:    Initially patient tachycardic with heart rate in the 130s, normotensive.  Afebrile. WBC 18K, K+ 3.3, BUN < 5, Cr 0.5 Alkaline phosphatase elevated at 259, total bili 1.2, AST 91, ALT 21, lipase 25. Stool Hemoccult positive Tox: ETOH 321 CXR >> LLL infiltrate.  Non-contrast CT scan abd/pelvis: moderate ascites, multifocal hypodense areas in liver on      Past Medical History:  Diagnosis Date  . Alcoholism ([place])   . Cirrhosis ([place])   . Hyperlipemia     Past Surgical History:  Procedure Laterality Date  . NO PAST SURGERIES      Prior to Admission medications   Medication Sig Start Date End Date Taking? Authorizing Provider  ibuprofen (ADVIL,MOTRIN) 200 MG tablet Take 400 mg by mouth every 6 (six) hours as needed for moderate pain.   Yes [provider]    Current Facility-Administered Medications  Medication Dose Route Frequency Provider Last Rate Last Dose  . 0.9 %  sodium chloride infusion   Intravenous Once Jani Gravel, MD      . azithromycin [facility]) 500 mg in sodium chloride 0.9 % 250 mL IVPB  500 mg Intravenous Q24H Jani Gravel, MD   Stopped at 04/17/17 0327  . cefTRIAXone (ROCEPHIN) 1 g in sodium chloride 0.9 % 100 mL IVPB  1 g Intravenous Q24H Jani Gravel, MD   Stopped at 04/17/17 0134  . dextrose 5 %-0.9 % sodium chloride infusion   Intravenous Continuous Arby Barrette A, NP 75 mL/hr at 04/17/17 [id number]    . folic acid injection  1 mg  1 mg Intravenous Daily Jani Gravel, MD      . lactulose ([place]) 10 GM/15ML solution 10 g  10 g Oral BID Jani Gravel, MD      . LORazepam (ATIVAN) tablet 2-3 mg  2-3 mg Oral Q1H PRN Jani Gravel, MD      . octreotide (SANDOSTATIN) 500 mcg in sodium chloride 0.9 % 250 mL (2 mcg/mL) infusion  50 mcg/hr Intravenous Continuous Julianne Rice, MD 25 mL/hr at 04/16/17 2252 50 mcg/hr at 04/16/17 2252  . ondansetron (ZOFRAN) injection 4 mg  4 mg Intravenous Q6H PRN Jani Gravel, MD   4 mg at 04/16/17 2326  . pantoprazole (PROTONIX) 80  mg in sodium chloride 0.9 % 250 mL (0.32 mg/mL) infusion  8 mg/hr Intravenous Continuous Julianne Rice, MD 25 mL/hr at 04/17/17 0003 8 mg/hr at 04/17/17 0003  . thiamine (B-1) injection 100 mg  100 mg Intravenous Daily Jani Gravel, MD      . zolpidem [facility]) tablet 5 mg  5 mg Oral QHS PRN Jani Gravel, MD       Current Outpatient Medications  Medication Sig Dispense Refill  . ibuprofen (ADVIL,MOTRIN) 200 MG tablet Take 400 mg by mouth every 6 (six) hours as needed for moderate pain.      Allergies as of 04/16/2017  . (No Known Allergies)    Family History  Problem Relation Age of Onset  . Coronary artery disease Unknown   . Diabetes type II Unknown     Social History   Socioeconomic History  . Marital status: Legally Separated    Spouse name: Not on file  . Number of children: Not on file  . Years of education: Not on file  . Highest education level: Not on file  Social Needs  . Financial resource strain: Not on file  . Food insecurity - worry: Not on file  . Food insecurity - inability: Not on file  . Transportation needs - medical: Not on file  . Transportation needs - non-medical: Not on file  Occupational History  . Not on file  Tobacco Use  . Smoking status: Current Every Day Smoker    Packs/day: 0.50    Types: Cigarettes  . Smokeless tobacco: Never Used  Substance and Sexual Activity  . Alcohol use: Yes    Comment: pt states last drink was 3 days ago  . Drug use: No  . Sexual activity: No  Other Topics Concern  . Not on file  Social History Narrative  . Not on file    Review of Systems: All systems reviewed and negative except where noted in HPI.  Physical Exam: Vital signs in last 24 hours: Temp:  [98 F (36.7 C)-98.3 F (36.8 C)] 98.2 F (36.8 C) (02/19 0445) Pulse Rate:  [99-130] 106 (02/19 0700) Resp:  [15-22] 18 (02/19 0445) BP: (99-130)/(59-92) 126/91 (02/19 0700) SpO2:  [77 %-100 %] 96 % (02/19 0700)   General:   Alert, thin male in  NAD Psych:  Pleasant, cooperative. Normal mood and affect. Eyes:  Pupils equal, sclera clear, no icterus.   Conjunctiva pink. Ears:  Normal auditory acuity. Nose:  No deformity, discharge,  or lesions. Neck:  Supple; no masses Lungs:  Clear throughout to auscultation.   No wheezes, crackles, or rhonchi.  Heart:  Mild sinus tachycardia,  no murmurs, no edema Abdomen:  Soft, moderately distended, nontender, BS active, no palp mass    Rectal:  Deferred  Msk:  Symmetrical without gross deformities. Marland Kitchen  Neurologic:  Alert and  oriented x4;  grossly normal neurologically. Skin:  Intact without significant lesions or rashes..   Intake/Output from previous day: No intake/output data recorded. Intake/Output this shift: Total I/O In: 280 [Blood:280] Out: -   Lab Results: Recent Labs    04/16/17 1540 04/17/17 0202  WBC 17.6* 15.4*  HGB 8.6* 7.4*  HCT 28.1* 23.7*  PLT 486* 330   BMET Recent Labs    04/16/17 1540 04/17/17 0202  NA 137 137  K 3.8 3.3*  CL 98* 99*  CO2 19* 18*  GLUCOSE 80 43*  BUN <5* <5*  CREATININE 0.65 0.57*  CALCIUM 8.0* 7.5*   LFT Recent Labs    04/17/17 0202  PROT 6.3*  ALBUMIN 2.6*  AST 91*  ALT 21  ALKPHOS 259*  BILITOT 1.2    Studies/Results: Ct Abdomen Pelvis Wo Contrast  Result Date: 04/17/2017 CLINICAL DATA:  Shortness of breath and abdominal swelling EXAM: CT ABDOMEN AND PELVIS WITHOUT CONTRAST TECHNIQUE: Multidetector CT imaging of the abdomen and pelvis was performed following the standard protocol without IV contrast. COMPARISON:  Ultrasound abdomen 06/25/2015, CT abdomen 10/20/2014 FINDINGS: Lower chest: Small left greater than right pleural effusions. Partial consolidations in the left greater than right lung bases and the lingula, likely atelectasis with pneumonia not excluded. Coronary vascular calcification. Normal heart size. Hepatobiliary: Poorly defined liver with heterogenous hypodense areas in the liver parenchyma. Nodular contour  consistent with cirrhosis. No calcified gallstones. No biliary dilatation Pancreas: Unremarkable. No pancreatic ductal dilatation or surrounding inflammatory changes. Spleen: Normal in size without focal abnormality. Adrenals/Urinary Tract: Adrenal glands are unremarkable. Kidneys are normal, without renal calculi, focal lesion, or hydronephrosis. Bladder is unremarkable. Stomach/Bowel: Stomach is within normal limits. Appendix not well seen but no right lower quadrant inflammation. No evidence of bowel wall thickening, distention, or inflammatory changes. Vascular/Lymphatic: Marked aortic atherosclerosis without aneurysmal dilatation. No significant pelvic nodes. Small upper abdominal lymph nodes measuring up to 9 mm in size. Reproductive: Prostate is unremarkable. Other: Fat in the inguinal canals. No free air. Moderate ascites within the abdomen and pelvis. Musculoskeletal: No acute or significant osseous findings. IMPRESSION: 1. Moderate ascites within the abdomen and pelvis 2. Nodular liver contour consistent with cirrhosis. Liver is poorly defined and very heterogeneous with multifocal hypodense areas in the parenchyma. When the patient is clinically stable and able to follow directions and hold their breath (preferably as an outpatient) further evaluation with dedicated abdominal MRI should be considered to evaluate for possible liver mass or masses. 3. Small bilateral pleural effusions with partial atelectasis or infiltrates at the lung bases. Electronically Signed   By: Donavan Foil M.D.   On: 04/17/2017 01:52   Dg Chest 2 View  Result Date: 04/16/2017 CLINICAL DATA:  Patient here with increasing SOB and abdominal swelling x 2-3 weeks. Patient has cirrhosis and continuing to drink ETOH. Alert but slow to respond. Skin with yellowish tent. No chest hx per pt. EXAM: CHEST  2 VIEW COMPARISON:  06/25/2015 FINDINGS: Heart size is normal. There is linear and patchy density at both lung bases, more confluent  at the left. Small left pleural effusion is present. No pulmonary edema. IMPRESSION: Left lower lobe infiltrate.  Bibasilar atelectasis. Electronically Signed   By: Nolon Nations M.D.   On: 04/16/2017 17:09    Tye Savoy, NP-C @  04/17/2017, 8:51 AM  Pager number [phone]  I have reviewed the entire case in detail with the above APP and discussed the plan in detail.  Therefore, I agree with the diagnoses recorded above. In addition,  I have personally interviewed and examined the patient and have personally reviewed any abdominal/pelvic CT scan images.  My additional thoughts are as follows:  Advanced alcohol related cirrhosis he is not receiving chronic care for this. Continued alcohol abuse Worsening ascites over the last month now peripheral edema. He does not have obvious hepatic encephalopathy Anemia of uncertain duration reported "dark stools" recently.  He is not having overt GI bleeding. His worsening ascites is almost certainly from progression of his liver disease with ongoing alcohol abuse, however I also note on physical exam that he has significant jugular venous distention with hepatojugular reflux. I am concerned there could be a degree of cardiomyopathy and possible pulmonary hypertension attributed to his worsening ascites.  The plan is as above, but I will hold off on upper endoscopy until after he has had therapeutic paracentesis his discomfort and then improve his rest her status. While he is not in respiratory distress, would prefer that he had better respiratory volumes prior to being sedated for an upper endoscopy. We have also ordered a 2-D echocardiogram evaluate LV function, valvular function and pulmonary pressure.  We will manage him expectantly, as his blood as needed, determine the timing of starting diuretics based on how he does after paracentesis, and order a prothrombin time for tomorrow morning. Previously negative for viral hepatitis, we will check  one more time.  He was told in no uncertain terms that he must be completely abstinent from alcohol. Nelida Meuse III Pager [phone]  Mon-Fri 8a-5p [phone] after 5p, weekends, holidays

## 2017-04-17 NOTE — ED Notes (Signed)
PT up to bedside commode, minimal assist. Small amount brown stool. No gross blood noted.

## 2017-04-17 NOTE — Progress Notes (Signed)
[facility] TEAM 1 - Stepdown/ICU TEAM  [name]  JQB:[id number] DOB: [birth year] DOA: 04/16/2017 PCP: [name], MD    Brief Narrative:  52 y.o. male w alcoholism and cirrhosis who presented w/ c/o abdominal distention x 4 weeks, along w/ black stool for 3 days.    In the ED a CXR suggested a Left lower lobe infiltrate.  WBC was 17.6, Hgb 8.6, Plt 486 FOBT +, and Ammonia 44.  GI was consulted by the ED for ? GI bleeding.   Significant Events: 2/18 admit 2/19 paracentesis in IR   Subjective: Pt is c/o low back pain, as well as abdom distention.  He denies SSCP, n/v, or sob.    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 For paracentesis today - GI assisting in his care  Melena Did not tolerate octreotide - on PPI gtt - GI following - for EGD when clinically stable   ?Liver lesions Noted on non-contrast CT abdom - will need MRI to better evaluate when more stable   Possible LLL PNA Cont empiric abx - follow culture data   Anemia - of chronic disease as well as acute blood loss  Baseline Hgb was 15 in April 2018 - has been transfused 2U PRBC thus far - f/u CBC now   Recent Labs  Lab 04/16/17 1540 04/17/17 0202  HGB 8.6* 7.4*    Recurrent hypoglycemia Likely due to liver disease - pt tolerates remarkable well - follow trend   Alcoholism  Has been counseled on absolute need to abstain entirely from EtOH  Hypokalemia replace - check Mg   DVT prophylaxis: SCDs Code Status: FULL CODE Family Communication: no family present at time of exam  Disposition Plan: SDU  Consultants:  [facility]  Antimicrobials:  Rocephin 2/18 > Azithro 2/18 >  Objective: Blood pressure 135/87, pulse 94, temperature 98.8 F (37.1 C), temperature source Oral, resp. rate 20, SpO2 98 %.  Intake/Output Summary (Last 24 hours) at 04/17/2017 1650 Last data filed at 04/17/2017 0705 Gross per 24 hour  Intake 280 ml  Output -  Net 280 ml   There  were no vitals filed for this visit.  Examination: General: No acute respiratory distress - alert and oriented  Lungs: poor air movement in B bases - no wheezing  Cardiovascular: Regular rate and rhythm without murmur gallop or rub normal S1 and S2 Abdomen: grossly distended/protuberant, bs hypoactive, mildly tender diffusely, no rebound  Extremities: No significant cyanosis, clubbing, or edema bilateral lower extremities  CBC: Recent Labs  Lab 04/16/17 1540 04/17/17 0202  WBC 17.6* 15.4*  HGB 8.6* 7.4*  HCT 28.1* 23.7*  MCV 94.6 94.4  PLT 486* 409   Basic Metabolic Panel: Recent Labs  Lab 04/16/17 1540 04/17/17 0202  NA 137 137  K 3.8 3.3*  CL 98* 99*  CO2 19* 18*  GLUCOSE 80 43*  BUN <5* <5*  CREATININE 0.65 0.57*  CALCIUM 8.0* 7.5*   GFR: Estimated Creatinine Clearance: 99.2 mL/min (A) (by C-G formula based on SCr of 0.57 mg/dL (L)).  Liver Function Tests: Recent Labs  Lab 04/17/17 0202  AST 91*  ALT 21  ALKPHOS 259*  BILITOT 1.2  PROT 6.3*  ALBUMIN 2.6*   Recent Labs  Lab 04/17/17 0202  LIPASE 25   Recent Labs  Lab 04/16/17 1547  AMMONIA 44*    Coagulation Profile: No results for input(s): INR, PROTIME in the last 168 hours.  Cardiac Enzymes: Recent Labs  Lab 04/17/17 0202  TROPONINI <0.03    HbA1C: Hgb A1c MFr Bld  Date/Time Value Ref Range Status  10/23/2014 03:24 PM 5.1 <5.7 % Final    Comment:                                                                           According to the ADA Clinical Practice Recommendations for 2011, when HbA1c is used as a screening test:     >=6.5%   Diagnostic of Diabetes Mellitus            (if abnormal result is confirmed)   5.7-6.4%   Increased risk of developing Diabetes Mellitus   References:Diagnosis and Classification of Diabetes Mellitus,Diabetes MCNO,7096,28(ZMOQH 1):S62-S69 and Standards of Medical Care in         Diabetes - 2011,Diabetes UTML,4650,35 (Suppl 1):S11-S61.        CBG: Recent Labs  Lab 04/17/17 0255 04/17/17 0421 04/17/17 0614 04/17/17 1319  GLUCAP 40* 55* 41* 162*   Scheduled Meds: . folic acid  1 mg Intravenous Daily  . lactulose  10 g Oral BID  . lidocaine      . thiamine  100 mg Intravenous Daily     LOS: 1 day   Cherene Altes, MD [facility] Office  [phone] Pager - Text Page per Amion as per below:  On-Call/Text Page:      [email or web address]      password [email or web address]  If 7PM-7AM, please contact night-coverage [email or web address] Password [facility] 04/17/2017, 4:50 PM

## 2017-04-17 NOTE — ED Notes (Addendum)
Patient transported to CT 

## 2017-04-17 NOTE — Procedures (Signed)
PROCEDURE SUMMARY:  Successful US guided paracentesis from right lateral abdomen.  Yielded 3.0 liters of yellow fluid.  No immediate complications.  Pt tolerated well.   Specimen was sent for labs.  Docia Barrier PA-C 04/17/2017 3:49 PM

## 2017-04-17 NOTE — ED Notes (Signed)
Patient denies pain and is resting comfortably.  

## 2017-04-17 NOTE — ED Notes (Signed)
Paged Admitting for (Autumn) RN, informed the doctor of Pts, CBG 41

## 2017-04-18 ENCOUNTER — Other Ambulatory Visit: Payer: Self-pay

## 2017-04-18 ENCOUNTER — Inpatient Hospital Stay (HOSPITAL_COMMUNITY): Payer: Self-pay

## 2017-04-18 ENCOUNTER — Encounter (HOSPITAL_COMMUNITY): Payer: Self-pay | Admitting: *Deleted

## 2017-04-18 DIAGNOSIS — D62 Acute posthemorrhagic anemia: Secondary | ICD-10-CM

## 2017-04-18 DIAGNOSIS — F1010 Alcohol abuse, uncomplicated: Secondary | ICD-10-CM

## 2017-04-18 DIAGNOSIS — R945 Abnormal results of liver function studies: Secondary | ICD-10-CM

## 2017-04-18 DIAGNOSIS — E876 Hypokalemia: Secondary | ICD-10-CM

## 2017-04-18 DIAGNOSIS — I361 Nonrheumatic tricuspid (valve) insufficiency: Secondary | ICD-10-CM

## 2017-04-18 DIAGNOSIS — J13 Pneumonia due to Streptococcus pneumoniae: Secondary | ICD-10-CM

## 2017-04-18 DIAGNOSIS — D638 Anemia in other chronic diseases classified elsewhere: Secondary | ICD-10-CM

## 2017-04-18 LAB — COMPREHENSIVE METABOLIC PANEL
ALK PHOS: 231 U/L — AB (ref 38–126)
ALT: 18 U/L (ref 17–63)
AST: 71 U/L — AB (ref 15–41)
Albumin: 2.5 g/dL — ABNORMAL LOW (ref 3.5–5.0)
Anion gap: 14 (ref 5–15)
CHLORIDE: 101 mmol/L (ref 101–111)
CO2: 24 mmol/L (ref 22–32)
CREATININE: 0.53 mg/dL — AB (ref 0.61–1.24)
Calcium: 7.5 mg/dL — ABNORMAL LOW (ref 8.9–10.3)
GFR calc Af Amer: >60 mL/min (ref >60)
GFR calc non Af Amer: >60 mL/min (ref >60)
GLUCOSE: 139 mg/dL — AB (ref 65–99)
Potassium: 3 mmol/L — ABNORMAL LOW (ref 3.5–5.1)
SODIUM: 139 mmol/L (ref 135–145)
Total Bilirubin: 1.1 mg/dL (ref 0.3–1.2)
Total Protein: 6.2 g/dL — ABNORMAL LOW (ref 6.5–8.1)

## 2017-04-18 LAB — AFP TUMOR MARKER: AFP, Serum, Tumor Marker: 2.9 ng/mL (ref 0.0–8.3)

## 2017-04-18 LAB — ECHOCARDIOGRAM COMPLETE
AVLVOTPG: 3 mmHg
Area-P 1/2: 4 cm2
CHL CUP LV S' LATERAL: 14 cm/s
CHL CUP MV DEC (S): 187
CHL CUP TV REG PEAK VELOCITY: 228 cm/s
E decel time: 187 msec
EERAT: 5.42
FS: 39 % (ref 28–44)
Height: 67 in
IVS/LV PW RATIO, ED: 0.82
LA diam end sys: 28 mm
LADIAMINDEX: 1.61 cm/m2
LASIZE: 28 mm
LAVOLA4C: 55.6 mL
LDCA: 3.8 cm2
LV E/e' medial: 5.42
LV E/e'average: 5.42
LV PW d: 11 mm — AB (ref 0.6–1.1)
LV TDI E'LATERAL: 11
LVELAT: 11 cm/s
LVOT SV: 74 mL
LVOT VTI: 19.5 cm
LVOT diameter: 22 mm
LVOT peak vel: 81.4 cm/s
MVPKEVEL: 59.6 m/s
P 1/2 time: 55 ms
TAPSE: 16.5 mm
TDI e' medial: 10.4
TRMAXVEL: 228 cm/s
Weight: 2232.82 oz

## 2017-04-18 LAB — TYPE AND SCREEN
ABO/RH(D): O POS
Antibody Screen: NEGATIVE
UNIT DIVISION: 0
Unit division: 0

## 2017-04-18 LAB — PROTIME-INR
INR: 1.22
Prothrombin Time: 15.3 seconds — ABNORMAL HIGH (ref 11.4–15.2)

## 2017-04-18 LAB — AMMONIA: Ammonia: 66 umol/L — ABNORMAL HIGH (ref 9–35)

## 2017-04-18 LAB — CBC
HEMATOCRIT: 32.8 % — AB (ref 39.0–52.0)
HEMOGLOBIN: 10.4 g/dL — AB (ref 13.0–17.0)
MCH: 29.1 pg (ref 26.0–34.0)
MCHC: 31.7 g/dL (ref 30.0–36.0)
MCV: 91.6 fL (ref 78.0–100.0)
Platelets: 222 10*3/uL (ref 150–400)
RBC: 3.58 MIL/uL — ABNORMAL LOW (ref 4.22–5.81)
RDW: 19.2 % — ABNORMAL HIGH (ref 11.5–15.5)
WBC: 10.1 10*3/uL (ref 4.0–10.5)

## 2017-04-18 LAB — BPAM RBC
Blood Product Expiration Date: 201903142359
Blood Product Expiration Date: 201903142359
ISSUE DATE / TIME: 201902190435
ISSUE DATE / TIME: 201902190204
UNIT TYPE AND RH: 5100
Unit Type and Rh: 5100

## 2017-04-18 LAB — POTASSIUM: Potassium: 3.6 mmol/L (ref 3.5–5.1)

## 2017-04-18 LAB — MAGNESIUM: Magnesium: 1.3 mg/dL — ABNORMAL LOW (ref 1.7–2.4)

## 2017-04-18 MED ORDER — POTASSIUM CHLORIDE CRYS ER 20 MEQ PO TBCR
50.0000 meq | EXTENDED_RELEASE_TABLET | Freq: Once | ORAL | Status: AC
  Administered 2017-04-18: 50 meq via ORAL
  Filled 2017-04-18: qty 2

## 2017-04-18 MED ORDER — LACTULOSE 10 GM/15ML PO SOLN
10.0000 g | Freq: Every day | ORAL | Status: DC
  Administered 2017-04-19 – 2017-04-20 (×2): 10 g via ORAL
  Filled 2017-04-18 (×2): qty 15

## 2017-04-18 MED ORDER — PANTOPRAZOLE SODIUM 40 MG PO TBEC
40.0000 mg | DELAYED_RELEASE_TABLET | Freq: Two times a day (BID) | ORAL | Status: DC
  Administered 2017-04-18: 40 mg via ORAL
  Filled 2017-04-18: qty 1

## 2017-04-18 MED ORDER — SPIRONOLACTONE 25 MG PO TABS
100.0000 mg | ORAL_TABLET | Freq: Every day | ORAL | Status: DC
  Administered 2017-04-18 – 2017-04-20 (×3): 100 mg via ORAL
  Filled 2017-04-18 (×3): qty 4

## 2017-04-18 MED ORDER — LORAZEPAM 2 MG/ML IJ SOLN
1.0000 mg | Freq: Once | INTRAMUSCULAR | Status: AC
  Administered 2017-04-18: 1 mg via INTRAVENOUS
  Filled 2017-04-18: qty 1

## 2017-04-18 NOTE — H&P (View-Only) (Signed)
[facility] Progress Note   Chief Complaint:   Black stools, cirrhosis   SUBJECTIVE:    hungry. No further bleeding. No abdominal pain.   He complains of many loose BMs since admission. ASSESSMENT AND PLAN:   1. Decompensated ETOH cirrhosis with ascites, hx of HE - s/p 3L paracentesis yesterday. No SBP . Continue Rocephin for SBP prophylaxis -liver lesions on non-contrast CT scan, AFP normal. Will need follow imaging at some point.  -Hx of HE, continue lactulose -2 gram sodium diet.  -outpatient HBV, HCV vaccination   2. Acute blood loss anemia with black stools at home. Resolved. No bleeding since arriving to hospital.  Hgb 14.9 >>> 7.4.  -Great response to 2 units of blood with hgb of 10.4 now. -continue ppi gtt -He needs EGD but awaiting results of 2 D echo first. EGD planned for tomorrow. The risks and benefits of EGD were discussed yesterday and the patient agrees to proceed.   3. Ongoing ETOH abuse  4. Elevated alk phos. It was 129 in April 2018, now 230. Normal AMA in 2016.  -awaiting echo    OBJECTIVE:    Results for DILLEN, BELMONTES (MRN [id number]) as of 04/18/2017 11:00  Ref. Range 04/17/2017 15:42  Monocyte-Macrophage-Serous Fluid Latest Ref Range: 50 - 90 % 83  Albumin, Fluid Latest Units: g/dL <1.0  Fluid Type-FALB Unknown FLUID  Total protein, fluid Latest Units: g/dL <3.0  Fluid Type-FCT Unknown FLUID  Fluid Type-FTP Unknown FLUID  Color, Fluid Latest Ref Range: YELLOW  YELLOW (A)  WBC, Fluid Latest Ref Range: 0 - 1,000 cu mm 128  Lymphs, Fluid Latest Units: % 12  Eos, Fluid Latest Units: % 0  Appearance, Fluid Latest Ref Range: CLEAR  HAZY (A)  Neutrophil Count, Fluid Latest Ref Range: 0 - 25 % 5    Vital signs in last 24 hours: Temp:  [98.1 F (36.7 C)-98.8 F (37.1 C)] 98.1 F (36.7 C) (02/20 0700) Pulse Rate:  [94-116] 99 (02/20 0700) Resp:  [11-21] 11 (02/20 0700) BP: (106-142)/(68-95) 112/83 (02/20 0700) SpO2:  [95  %-99 %] 95 % (02/20 0700) Weight:  [139 lb 8.8 oz (63.3 kg)] 139 lb 8.8 oz (63.3 kg) (02/19 1640) Last BM Date: 04/18/17 General:   Alert, thin male in NAD EENT:  Normal hearing, non icteric sclera, conjunctive pink.  Heart:  Regular rate and rhythm; no murmurs, lower extremity edema Pulm: Normal respiratory effort, lungs CTA bilaterally without wheezes or crackles. Abdomen:  Soft, moderately distended but soft, normal bowel sounds, nontender.     Neurologic:  Alert and  oriented x4;  grossly normal neurologically. Psych:  Pleasant, cooperative.  Normal mood and affect.   Intake/Output from previous day: 02/19 0701 - 02/20 0700 In: 3305 [P.O.:120; I.V.:2205; Blood:280; IV Piggyback:700] Out: 600 [Urine:600] Intake/Output this shift: No intake/output data recorded.  Lab Results: Recent Labs    04/17/17 0202 04/17/17 1826 04/18/17 0211  WBC 15.4* 9.2 10.1  HGB 7.4* 10.5* 10.4*  HCT 23.7* 33.2* 32.8*  PLT 330 233 222   BMET Recent Labs    04/16/17 1540 04/17/17 0202 04/18/17 0211  NA 137 137 139  K 3.8 3.3* 3.0*  CL 98* 99* 101  CO2 19* 18* 24  GLUCOSE 80 43* 139*  BUN <5* <5* <5*  CREATININE 0.65 0.57* 0.53*  CALCIUM 8.0* 7.5* 7.5*   LFT Recent Labs    04/18/17 0211  PROT 6.2*  ALBUMIN 2.5*  AST 71*  ALT 18  ALKPHOS  231*  BILITOT 1.1   PT/INR Recent Labs    04/18/17 0211  LABPROT 15.3*  INR 1.22   AFP 2.9 INR 1.22 NH3 = 66  Hepatitis Panel No results for input(s): HEPBSAG, HCVAB, HEPAIGM, HEPBIGM in the last 72 hours.  Ct Abdomen Pelvis Wo Contrast  Result Date: 04/17/2017 CLINICAL DATA:  Shortness of breath and abdominal swelling EXAM: CT ABDOMEN AND PELVIS WITHOUT CONTRAST TECHNIQUE: Multidetector CT imaging of the abdomen and pelvis was performed following the standard protocol without IV contrast. COMPARISON:  Ultrasound abdomen 06/25/2015, CT abdomen 10/20/2014 FINDINGS: Lower chest: Small left greater than right pleural effusions. Partial  consolidations in the left greater than right lung bases and the lingula, likely atelectasis with pneumonia not excluded. Coronary vascular calcification. Normal heart size. Hepatobiliary: Poorly defined liver with heterogenous hypodense areas in the liver parenchyma. Nodular contour consistent with cirrhosis. No calcified gallstones. No biliary dilatation Pancreas: Unremarkable. No pancreatic ductal dilatation or surrounding inflammatory changes. Spleen: Normal in size without focal abnormality. Adrenals/Urinary Tract: Adrenal glands are unremarkable. Kidneys are normal, without renal calculi, focal lesion, or hydronephrosis. Bladder is unremarkable. Stomach/Bowel: Stomach is within normal limits. Appendix not well seen but no right lower quadrant inflammation. No evidence of bowel wall thickening, distention, or inflammatory changes. Vascular/Lymphatic: Marked aortic atherosclerosis without aneurysmal dilatation. No significant pelvic nodes. Small upper abdominal lymph nodes measuring up to 9 mm in size. Reproductive: Prostate is unremarkable. Other: Fat in the inguinal canals. No free air. Moderate ascites within the abdomen and pelvis. Musculoskeletal: No acute or significant osseous findings. IMPRESSION: 1. Moderate ascites within the abdomen and pelvis 2. Nodular liver contour consistent with cirrhosis. Liver is poorly defined and very heterogeneous with multifocal hypodense areas in the parenchyma. When the patient is clinically stable and able to follow directions and hold their breath (preferably as an outpatient) further evaluation with dedicated abdominal MRI should be considered to evaluate for possible liver mass or masses. 3. Small bilateral pleural effusions with partial atelectasis or infiltrates at the lung bases. Electronically Signed   By: Donavan Foil M.D.   On: 04/17/2017 01:52   Dg Chest 2 View  Result Date: 04/16/2017 CLINICAL DATA:  Patient here with increasing SOB and abdominal swelling  x 2-3 weeks. Patient has cirrhosis and continuing to drink ETOH. Alert but slow to respond. Skin with yellowish tent. No chest hx per pt. EXAM: CHEST  2 VIEW COMPARISON:  06/25/2015 FINDINGS: Heart size is normal. There is linear and patchy density at both lung bases, more confluent at the left. Small left pleural effusion is present. No pulmonary edema. IMPRESSION: Left lower lobe infiltrate.  Bibasilar atelectasis. Electronically Signed   By: Nolon Nations M.D.   On: 04/16/2017 17:09   Ir Paracentesis  Result Date: 04/17/2017 INDICATION: Patient with history of cirrhosis, ascites. Request is made for diagnostic and therapeutic paracentesis. EXAM: ULTRASOUND GUIDED DIAGNOSTIC AND THERAPEUTIC PARACENTESIS MEDICATIONS: 10 mL 2% lidocaine COMPLICATIONS: None immediate. PROCEDURE: Informed written consent was obtained from the patient after a discussion of the risks, benefits and alternatives to treatment. A timeout was performed prior to the initiation of the procedure. Initial ultrasound scanning demonstrates a moderate amount of ascites within the right lower abdominal quadrant. The right lower abdomen was prepped and draped in the usual sterile fashion. 2% lidocaine was used for local anesthesia. Following this, a 19 gauge, 7-cm, Yueh catheter was introduced. An ultrasound image was saved for documentation purposes. The paracentesis was performed. The catheter was removed and a  dressing was applied. The patient tolerated the procedure well without immediate post procedural complication. FINDINGS: A total of approximately 3.0 liters of yellow fluid was removed. Samples were sent to the laboratory as requested by the clinical team. IMPRESSION: Successful ultrasound-guided diagnostic and therapeutic paracentesis yielding 3.0 liters of peritoneal fluid. Read by: Brynda Greathouse PA-C Electronically Signed   By: Jacqulynn Cadet M.D.   On: 04/17/2017 15:50    Cardiac echo shows normal LV and RV function without  pulmonary HTN.  Mild valvular abnormalities  Principal Problem:   Anemia Active Problems:   Community acquired pneumonia   Alcohol intoxication ([place])   Melena   Tachycardia     LOS: 2 days   Tye Savoy ,NP 04/18/2017, 10:59 AM  Pager number [phone]  I have discussed the case with the PA, and that is the plan I formulated. I personally interviewed and examined the patient.  CC: Alcohol cirrhosis Large volume portal hypertensive ascites  (no SBP).  Fluid Albumin < 1.0 Anemia of chronic disease and probably subacute blood loss Reported melena Acute diarrhea - from Abx and lactulose  Worsened ascites recently appears to be from decompensation of liver disease with ongoing EtOH abuse.  No HCC, PVT or pulm HTN Not clinically encephalopathic  Plan: EGD tomorrow - he is agreeable.  The benefits and risks of the planned procedure were described in detail with the patient or (when appropriate) their health care proxy.  Risks were outlined as including, but not limited to, bleeding, infection, perforation, adverse medication reaction leading to cardiac or pulmonary decompensation, or pancreatitis (if ERCP).  The limitation of incomplete mucosal visualization was also discussed.  No guarantees or warranties were given.  Start spironolactone 100 mg once daily.  Will hold off on furosemide since potassium low at 3.0  If the Abx are not for suspected pulmonary infection (of note, no fever or cough), then please stop them.  His ascites is not infected.  Lactulose dose decreased.  Total time 40 minutes, over half spent counseling and coordinating plan with patient and care team.   Nelida Meuse III Pager [phone]  Mon-Fri 8a-5p [phone] after 5p, weekends, holidays

## 2017-04-18 NOTE — Progress Notes (Addendum)
[facility] Progress Note   Chief Complaint:   Black stools, cirrhosis   SUBJECTIVE:    hungry. No further bleeding. No abdominal pain.   He complains of many loose BMs since admission. ASSESSMENT AND PLAN:   1. Decompensated ETOH cirrhosis with ascites, hx of HE - s/p 3L paracentesis yesterday. No SBP . Continue Rocephin for SBP prophylaxis -liver lesions on non-contrast CT scan, AFP normal. Will need follow imaging at some point.  -Hx of HE, continue lactulose -2 gram sodium diet.  -outpatient HBV, HCV vaccination   2. Acute blood loss anemia with black stools at home. Resolved. No bleeding since arriving to hospital.  Hgb 14.9 >>> 7.4.  -Great response to 2 units of blood with hgb of 10.4 now. -continue ppi gtt -He needs EGD but awaiting results of 2 D echo first. EGD planned for tomorrow. The risks and benefits of EGD were discussed yesterday and the patient agrees to proceed.   3. Ongoing ETOH abuse  4. Elevated alk phos. It was 129 in April 2018, now 230. Normal AMA in 2016.  -awaiting echo    OBJECTIVE:    Results for [name], [name] (MRN [id number]) as of 04/18/2017 11:00  Ref. Range 04/17/2017 15:42  Monocyte-Macrophage-Serous Fluid Latest Ref Range: 50 - 90 % 83  Albumin, Fluid Latest Units: g/dL <1.0  Fluid Type-FALB Unknown FLUID  Total protein, fluid Latest Units: g/dL <3.0  Fluid Type-FCT Unknown FLUID  Fluid Type-FTP Unknown FLUID  Color, Fluid Latest Ref Range: YELLOW  YELLOW (A)  WBC, Fluid Latest Ref Range: 0 - 1,000 cu mm 128  Lymphs, Fluid Latest Units: % 12  Eos, Fluid Latest Units: % 0  Appearance, Fluid Latest Ref Range: CLEAR  HAZY (A)  Neutrophil Count, Fluid Latest Ref Range: 0 - 25 % 5    Vital signs in last 24 hours: Temp:  [98.1 F (36.7 C)-98.8 F (37.1 C)] 98.1 F (36.7 C) (02/20 0700) Pulse Rate:  [94-116] 99 (02/20 0700) Resp:  [11-21] 11 (02/20 0700) BP: (106-142)/(68-95) 112/83 (02/20 0700) SpO2:  [95  %-99 %] 95 % (02/20 0700) Weight:  [139 lb 8.8 oz (63.3 kg)] 139 lb 8.8 oz (63.3 kg) (02/19 1640) Last BM Date: 04/18/17 General:   Alert, thin male in NAD EENT:  Normal hearing, non icteric sclera, conjunctive pink.  Heart:  Regular rate and rhythm; no murmurs, lower extremity edema Pulm: Normal respiratory effort, lungs CTA bilaterally without wheezes or crackles. Abdomen:  Soft, moderately distended but soft, normal bowel sounds, nontender.     Neurologic:  Alert and  oriented x4;  grossly normal neurologically. Psych:  Pleasant, cooperative.  Normal mood and affect.   Intake/Output from previous day: 02/19 0701 - 02/20 0700 In: 3305 [P.O.:120; I.V.:2205; Blood:280; IV Piggyback:700] Out: 600 [Urine:600] Intake/Output this shift: No intake/output data recorded.  Lab Results: Recent Labs    04/17/17 0202 04/17/17 1826 04/18/17 0211  WBC 15.4* 9.2 10.1  HGB 7.4* 10.5* 10.4*  HCT 23.7* 33.2* 32.8*  PLT 330 233 222   BMET Recent Labs    04/16/17 1540 04/17/17 0202 04/18/17 0211  NA 137 137 139  K 3.8 3.3* 3.0*  CL 98* 99* 101  CO2 19* 18* 24  GLUCOSE 80 43* 139*  BUN <5* <5* <5*  CREATININE 0.65 0.57* 0.53*  CALCIUM 8.0* 7.5* 7.5*   LFT Recent Labs    04/18/17 0211  PROT 6.2*  ALBUMIN 2.5*  AST 71*  ALT 18  ALKPHOS  231*  BILITOT 1.1   PT/INR Recent Labs    04/18/17 0211  LABPROT 15.3*  INR 1.22   AFP 2.9 INR 1.22 NH3 = 66  Hepatitis Panel No results for input(s): HEPBSAG, HCVAB, HEPAIGM, HEPBIGM in the last 72 hours.  Ct Abdomen Pelvis Wo Contrast  Result Date: 04/17/2017 CLINICAL DATA:  Shortness of breath and abdominal swelling EXAM: CT ABDOMEN AND PELVIS WITHOUT CONTRAST TECHNIQUE: Multidetector CT imaging of the abdomen and pelvis was performed following the standard protocol without IV contrast. COMPARISON:  Ultrasound abdomen 06/25/2015, CT abdomen 10/20/2014 FINDINGS: Lower chest: Small left greater than right pleural effusions. Partial  consolidations in the left greater than right lung bases and the lingula, likely atelectasis with pneumonia not excluded. Coronary vascular calcification. Normal heart size. Hepatobiliary: Poorly defined liver with heterogenous hypodense areas in the liver parenchyma. Nodular contour consistent with cirrhosis. No calcified gallstones. No biliary dilatation Pancreas: Unremarkable. No pancreatic ductal dilatation or surrounding inflammatory changes. Spleen: Normal in size without focal abnormality. Adrenals/Urinary Tract: Adrenal glands are unremarkable. Kidneys are normal, without renal calculi, focal lesion, or hydronephrosis. Bladder is unremarkable. Stomach/Bowel: Stomach is within normal limits. Appendix not well seen but no right lower quadrant inflammation. No evidence of bowel wall thickening, distention, or inflammatory changes. Vascular/Lymphatic: Marked aortic atherosclerosis without aneurysmal dilatation. No significant pelvic nodes. Small upper abdominal lymph nodes measuring up to 9 mm in size. Reproductive: Prostate is unremarkable. Other: Fat in the inguinal canals. No free air. Moderate ascites within the abdomen and pelvis. Musculoskeletal: No acute or significant osseous findings. IMPRESSION: 1. Moderate ascites within the abdomen and pelvis 2. Nodular liver contour consistent with cirrhosis. Liver is poorly defined and very heterogeneous with multifocal hypodense areas in the parenchyma. When the patient is clinically stable and able to follow directions and hold their breath (preferably as an outpatient) further evaluation with dedicated abdominal MRI should be considered to evaluate for possible liver mass or masses. 3. Small bilateral pleural effusions with partial atelectasis or infiltrates at the lung bases. Electronically Signed   By: Donavan Foil M.D.   On: 04/17/2017 01:52   Dg Chest 2 View  Result Date: 04/16/2017 CLINICAL DATA:  Patient here with increasing SOB and abdominal swelling  x 2-3 weeks. Patient has cirrhosis and continuing to drink ETOH. Alert but slow to respond. Skin with yellowish tent. No chest hx per pt. EXAM: CHEST  2 VIEW COMPARISON:  06/25/2015 FINDINGS: Heart size is normal. There is linear and patchy density at both lung bases, more confluent at the left. Small left pleural effusion is present. No pulmonary edema. IMPRESSION: Left lower lobe infiltrate.  Bibasilar atelectasis. Electronically Signed   By: Nolon Nations M.D.   On: 04/16/2017 17:09   Ir Paracentesis  Result Date: 04/17/2017 INDICATION: Patient with history of cirrhosis, ascites. Request is made for diagnostic and therapeutic paracentesis. EXAM: ULTRASOUND GUIDED DIAGNOSTIC AND THERAPEUTIC PARACENTESIS MEDICATIONS: 10 mL 2% lidocaine COMPLICATIONS: None immediate. PROCEDURE: Informed written consent was obtained from the patient after a discussion of the risks, benefits and alternatives to treatment. A timeout was performed prior to the initiation of the procedure. Initial ultrasound scanning demonstrates a moderate amount of ascites within the right lower abdominal quadrant. The right lower abdomen was prepped and draped in the usual sterile fashion. 2% lidocaine was used for local anesthesia. Following this, a 19 gauge, 7-cm, Yueh catheter was introduced. An ultrasound image was saved for documentation purposes. The paracentesis was performed. The catheter was removed and a  dressing was applied. The patient tolerated the procedure well without immediate post procedural complication. FINDINGS: A total of approximately 3.0 liters of yellow fluid was removed. Samples were sent to the laboratory as requested by the clinical team. IMPRESSION: Successful ultrasound-guided diagnostic and therapeutic paracentesis yielding 3.0 liters of peritoneal fluid. Read by: Brynda Greathouse PA-C Electronically Signed   By: Jacqulynn Cadet M.D.   On: 04/17/2017 15:50    Cardiac echo shows normal LV and RV function without  pulmonary HTN.  Mild valvular abnormalities  Principal Problem:   Anemia Active Problems:   Community acquired pneumonia   Alcohol intoxication ([place])   Melena   Tachycardia     LOS: 2 days   Tye Savoy ,NP 04/18/2017, 10:59 AM  Pager number [phone]  I have discussed the case with the PA, and that is the plan I formulated. I personally interviewed and examined the patient.  CC: Alcohol cirrhosis Large volume portal hypertensive ascites  (no SBP).  Fluid Albumin < 1.0 Anemia of chronic disease and probably subacute blood loss Reported melena Acute diarrhea - from Abx and lactulose  Worsened ascites recently appears to be from decompensation of liver disease with ongoing EtOH abuse.  No HCC, PVT or pulm HTN Not clinically encephalopathic  Plan: EGD tomorrow - he is agreeable.  The benefits and risks of the planned procedure were described in detail with the patient or (when appropriate) their health care proxy.  Risks were outlined as including, but not limited to, bleeding, infection, perforation, adverse medication reaction leading to cardiac or pulmonary decompensation, or pancreatitis (if ERCP).  The limitation of incomplete mucosal visualization was also discussed.  No guarantees or warranties were given.  Start spironolactone 100 mg once daily.  Will hold off on furosemide since potassium low at 3.0  If the Abx are not for suspected pulmonary infection (of note, no fever or cough), then please stop them.  His ascites is not infected.  Lactulose dose decreased.  Total time 40 minutes, over half spent counseling and coordinating plan with patient and care team.   Nelida Meuse III Pager [phone]  Mon-Fri 8a-5p [phone] after 5p, weekends, holidays

## 2017-04-18 NOTE — Progress Notes (Signed)
PROGRESS NOTE    [name]  OVF:[id number] DOB: 09/27/1965 DOA: 04/16/2017 PCP: [name], MD   Brief Narrative:  52 y.o. Male  PMHx EtOH Cirrhosis, EtOH abuse, HLD,  Presented w/ c/o abdominal distention x 4 weeks, along w/ black stool for 3 days.     In the ED a CXR suggested a Left lower lobe infiltrate.  WBC was 17.6, Hgb 8.6, Plt 486 FOBT +, and Ammonia 44.  GI was consulted by the ED for ? GI bleeding.     Subjective: 2/20   A/O 4, states drinks 24 ounce beers x 2 per day. Wants to go to inpatient rehabilitation once discharged from hospital. Has attempted to stop cold [place] in the past unsuccessfully. Negative CP, negative SOB, negative abdominal pain   Assessment & Plan:   Principal Problem:   Anemia Active Problems:   Community acquired pneumonia   Alcohol intoxication ([place])   Melena   Tachycardia  EtOH cirrhosis of the liver -S/P paracentesis  Melena -EGD pending. Once cleared by cardiology with echocardiogram GI will perform. -PPI drip  Liver lesions? -Lesions noted on noncontrast CT abdomen -Obtain MRI HCC protocol    LLL pneumonia -Continue empiric antibiotic  Anemia of chronic disease/acute blood loss anemia(baseline HGb 11 June 2016) -Transfused 2 units PRBC -Follow hemoglobin level transfuse for hemoglobin<7 Recent Labs  Lab 04/16/17 1540 04/17/17 0202 04/17/17 1826 04/18/17 0211  HGB 8.6* 7.4* 10.5* 10.4*   Recurrent hypoglycemia Likely due to liver disease - pt tolerates remarkable well - follow trend     Hypokalemia -K-Dur 50 mEq -Recheck K/Mg at 1500     DVT prophylaxis: SCD Code Status: Full Family Communication: None Disposition Plan: TBD   Consultants:  [facility]    Procedures/Significant Events:  2/18 admit 2/19 paracentesis in IR  2/19 CT abdomen pelvis WO contrast:Moderate ascites within the abdomen and pelvis 2. Nodular liver contour consistent with cirrhosis. Liver is poorly defined  and very heterogeneous with multifocal hypodense areas in the parenchyma. When the patient is clinically stable and able to follow directions and hold their breath (preferably as an outpatient) further evaluation with dedicated abdominal MRI should be considered to evaluate for possible liver mass or masses. 3. Small bilateral pleural effusions with partial atelectasis or infiltrates at the lung bases.    I have personally reviewed and interpreted all radiology studies and my findings are as above.  VENTILATOR SETTINGS:    Cultures   Antimicrobials: . Anti-infectives (From admission, onward)   Start     Stop   04/16/17 2315  cefTRIAXone (ROCEPHIN) 1 g in sodium chloride 0.9 % 100 mL IVPB         04/16/17 2315  azithromycin (ZITHROMAX) 500 mg in sodium chloride 0.9 % 250 mL IVPB             Devices    LINES / TUBES:      Continuous Infusions: . azithromycin Stopped (04/18/17 0040)  . cefTRIAXone (ROCEPHIN)  IV Stopped (04/18/17 0010)  . dextrose 5 % and 0.9% NaCl 50 mL/hr at 04/17/17 1746  . pantoprozole (PROTONIX) infusion 8 mg/hr (04/18/17 0645)     Objective: Vitals:   04/17/17 1914 04/17/17 2316 04/18/17 0330 04/18/17 0700  BP: 129/90 (!) 138/93 106/68 112/83  Pulse: (!) 102 (!) 106 (!) 114 99  Resp: (!) 21 18 19 11   Temp: 98.7 F (37.1 C) 98.6 F (37 C) 98.7 F (37.1 C) 98.1 F (36.7 C)  TempSrc: Oral Oral  Oral Oral  SpO2: 99%  95% 95%  Weight:      Height:        Intake/Output Summary (Last 24 hours) at 04/18/2017 8182 Last data filed at 04/18/2017 0400 Gross per 24 hour  Intake 3025 ml  Output 600 ml  Net 2425 ml   Filed Weights   04/17/17 1640  Weight: 139 lb 8.8 oz (63.3 kg)    Examination:  General: A/O 4, No acute respiratory distress, cachectic Eyes: negative scleral hemorrhage, negative anisocoria, negative icterus Neck:  Negative scars, masses, torticollis, lymphadenopathy, JVD Lungs: Clear to auscultation bilaterally  without wheezes or crackles Cardiovascular: Regular rate and rhythm without murmur gallop or rub normal S1 and S2 Abdomen: negative abdominal pain, nondistended, positive soft, bowel sounds, no rebound, no ascites, no appreciable mass Extremities: No significant cyanosis, clubbing, or edema bilateral lower extremities Skin: Negative rashes, lesions, ulcers Psychiatric:  Negative depression, negative anxiety, negative fatigue, negative mania  Central nervous system:  Cranial nerves II through XII intact, tongue/uvula midline, all extremities muscle strength 5/5, sensation intact throughout,  negative dysarthria, negative expressive aphasia, negative receptive aphasia.  .     Data Reviewed: Care during the described time interval was provided by me .  I have reviewed this patient's available data, including medical history, events of note, physical examination, and all test results as part of my evaluation.   CBC: Recent Labs  Lab 04/16/17 1540 04/17/17 0202 04/17/17 1826 04/18/17 0211  WBC 17.6* 15.4* 9.2 10.1  HGB 8.6* 7.4* 10.5* 10.4*  HCT 28.1* 23.7* 33.2* 32.8*  MCV 94.6 94.4 91.7 91.6  PLT 486* 330 233 993   Basic Metabolic Panel: Recent Labs  Lab 04/16/17 1540 04/17/17 0202 04/18/17 0211  NA 137 137 139  K 3.8 3.3* 3.0*  CL 98* 99* 101  CO2 19* 18* 24  GLUCOSE 80 43* 139*  BUN <5* <5* <5*  CREATININE 0.65 0.57* 0.53*  CALCIUM 8.0* 7.5* 7.5*   GFR: Estimated Creatinine Clearance: 95.6 mL/min (A) (by C-G formula based on SCr of 0.53 mg/dL (L)). Liver Function Tests: Recent Labs  Lab 04/17/17 0202 04/18/17 0211  AST 91* 71*  ALT 21 18  ALKPHOS 259* 231*  BILITOT 1.2 1.1  PROT 6.3* 6.2*  ALBUMIN 2.6* 2.5*   Recent Labs  Lab 04/17/17 0202  LIPASE 25   Recent Labs  Lab 04/16/17 1547 04/18/17 0211  AMMONIA 44* 66*   Coagulation Profile: Recent Labs  Lab 04/18/17 0211  INR 1.22   Cardiac Enzymes: Recent Labs  Lab 04/17/17 0202  TROPONINI <0.03    BNP (last 3 results) No results for input(s): PROBNP in the last 8760 hours. HbA1C: No results for input(s): HGBA1C in the last 72 hours. CBG: Recent Labs  Lab 04/17/17 0255 04/17/17 0421 04/17/17 0614 04/17/17 1319  GLUCAP 40* 55* 41* 162*   Lipid Profile: No results for input(s): CHOL, HDL, LDLCALC, TRIG, CHOLHDL, LDLDIRECT in the last 72 hours. Thyroid Function Tests: No results for input(s): TSH, T4TOTAL, FREET4, T3FREE, THYROIDAB in the last 72 hours. Anemia Panel: No results for input(s): VITAMINB12, FOLATE, FERRITIN, TIBC, IRON, RETICCTPCT in the last 72 hours. Urine analysis:    Component Value Date/Time   COLORURINE YELLOW 04/16/2017 2226   APPEARANCEUR HAZY (A) 04/16/2017 2226   LABSPEC 1.016 04/16/2017 2226   PHURINE 6.0 04/16/2017 2226   GLUCOSEU NEGATIVE 04/16/2017 2226   HGBUR NEGATIVE 04/16/2017 2226   BILIRUBINUR NEGATIVE 04/16/2017 2226   KETONESUR 80 (A) 04/16/2017 2226  PROTEINUR NEGATIVE 04/16/2017 2226   UROBILINOGEN 1.0 10/20/2014 1026   NITRITE NEGATIVE 04/16/2017 2226   LEUKOCYTESUR NEGATIVE 04/16/2017 2226   Sepsis Labs: @LABRCNTIP (procalcitonin:4,lacticidven:4)  ) Recent Results (from the past 240 hour(s))  MRSA PCR Screening     Status: None   Collection Time: 04/17/17  3:12 PM  Result Value Ref Range Status   MRSA by PCR NEGATIVE NEGATIVE Final    Comment:        The GeneXpert MRSA Assay (FDA approved for NASAL specimens only), is one component of a comprehensive MRSA colonization surveillance program. It is not intended to diagnose MRSA infection nor to guide or monitor treatment for MRSA infections. Performed at [facility], [place] [address], [place], [place] [address]   Gram stain     Status: None   Collection Time: 04/17/17  3:42 PM  Result Value Ref Range Status   Specimen Description PERITONEAL  Final   Special Requests NONE  Final   Gram Stain   Final    FEW WBC PRESENT, PREDOMINANTLY MONONUCLEAR NO ORGANISMS  SEEN Performed at [facility], [address], [place], [place] [address]    Report Status 04/17/2017 FINAL  Final         Radiology Studies: Ct Abdomen Pelvis Wo Contrast  Result Date: 04/17/2017 CLINICAL DATA:  Shortness of breath and abdominal swelling EXAM: CT ABDOMEN AND PELVIS WITHOUT CONTRAST TECHNIQUE: Multidetector CT imaging of the abdomen and pelvis was performed following the standard protocol without IV contrast. COMPARISON:  Ultrasound abdomen 06/25/2015, CT abdomen 10/20/2014 FINDINGS: Lower chest: Small left greater than right pleural effusions. Partial consolidations in the left greater than right lung bases and the lingula, likely atelectasis with pneumonia not excluded. Coronary vascular calcification. Normal heart size. Hepatobiliary: Poorly defined liver with heterogenous hypodense areas in the liver parenchyma. Nodular contour consistent with cirrhosis. No calcified gallstones. No biliary dilatation Pancreas: Unremarkable. No pancreatic ductal dilatation or surrounding inflammatory changes. Spleen: Normal in size without focal abnormality. Adrenals/Urinary Tract: Adrenal glands are unremarkable. Kidneys are normal, without renal calculi, focal lesion, or hydronephrosis. Bladder is unremarkable. Stomach/Bowel: Stomach is within normal limits. Appendix not well seen but no right lower quadrant inflammation. No evidence of bowel wall thickening, distention, or inflammatory changes. Vascular/Lymphatic: Marked aortic atherosclerosis without aneurysmal dilatation. No significant pelvic nodes. Small upper abdominal lymph nodes measuring up to 9 mm in size. Reproductive: Prostate is unremarkable. Other: Fat in the inguinal canals. No free air. Moderate ascites within the abdomen and pelvis. Musculoskeletal: No acute or significant osseous findings. IMPRESSION: 1. Moderate ascites within the abdomen and pelvis 2. Nodular liver contour consistent with cirrhosis. Liver is poorly  defined and very heterogeneous with multifocal hypodense areas in the parenchyma. When the patient is clinically stable and able to follow directions and hold their breath (preferably as an outpatient) further evaluation with dedicated abdominal MRI should be considered to evaluate for possible liver mass or masses. 3. Small bilateral pleural effusions with partial atelectasis or infiltrates at the lung bases. Electronically Signed   By: Donavan Foil M.D.   On: 04/17/2017 01:52   Dg Chest 2 View  Result Date: 04/16/2017 CLINICAL DATA:  Patient here with increasing SOB and abdominal swelling x 2-3 weeks. Patient has cirrhosis and continuing to drink ETOH. Alert but slow to respond. Skin with yellowish tent. No chest hx per pt. EXAM: CHEST  2 VIEW COMPARISON:  06/25/2015 FINDINGS: Heart size is normal. There is linear and patchy density at  both lung bases, more confluent at the left. Small left pleural effusion is present. No pulmonary edema. IMPRESSION: Left lower lobe infiltrate.  Bibasilar atelectasis. Electronically Signed   By: Nolon Nations M.D.   On: 04/16/2017 17:09   Ir Paracentesis  Result Date: 04/17/2017 INDICATION: Patient with history of cirrhosis, ascites. Request is made for diagnostic and therapeutic paracentesis. EXAM: ULTRASOUND GUIDED DIAGNOSTIC AND THERAPEUTIC PARACENTESIS MEDICATIONS: 10 mL 2% lidocaine COMPLICATIONS: None immediate. PROCEDURE: Informed written consent was obtained from the patient after a discussion of the risks, benefits and alternatives to treatment. A timeout was performed prior to the initiation of the procedure. Initial ultrasound scanning demonstrates a moderate amount of ascites within the right lower abdominal quadrant. The right lower abdomen was prepped and draped in the usual sterile fashion. 2% lidocaine was used for local anesthesia. Following this, a 19 gauge, 7-cm, Yueh catheter was introduced. An ultrasound image was saved for documentation purposes.  The paracentesis was performed. The catheter was removed and a dressing was applied. The patient tolerated the procedure well without immediate post procedural complication. FINDINGS: A total of approximately 3.0 liters of yellow fluid was removed. Samples were sent to the laboratory as requested by the clinical team. IMPRESSION: Successful ultrasound-guided diagnostic and therapeutic paracentesis yielding 3.0 liters of peritoneal fluid. Read by: Brynda Greathouse PA-C Electronically Signed   By: Jacqulynn Cadet M.D.   On: 04/17/2017 15:50        Scheduled Meds: . folic acid  1 mg Oral Daily  . lactulose  10 g Oral BID  . thiamine  100 mg Oral Daily   Continuous Infusions: . azithromycin Stopped (04/18/17 0040)  . cefTRIAXone (ROCEPHIN)  IV Stopped (04/18/17 0010)  . dextrose 5 % and 0.9% NaCl 50 mL/hr at 04/17/17 1746  . pantoprozole (PROTONIX) infusion 8 mg/hr (04/18/17 0645)     LOS: 2 days    Time spent: 40 minutes    Steph Cheadle, Geraldo Docker, MD [facility] Pager [phone]   If 7PM-7AM, please contact night-coverage [email or web address] Password [facility] 04/18/2017, 7:37 AM

## 2017-04-18 NOTE — Progress Notes (Signed)
  Echocardiogram 2D Echocardiogram has been performed.  [name] [name] 04/18/2017, 10:53 AM

## 2017-04-19 ENCOUNTER — Inpatient Hospital Stay (HOSPITAL_COMMUNITY): Payer: Self-pay | Admitting: Anesthesiology

## 2017-04-19 ENCOUNTER — Encounter (HOSPITAL_COMMUNITY)
Admission: EM | Disposition: A | Discharge status: Home / Self Care | Payer: Self-pay | Source: Home / Self Care | Attending: Internal Medicine

## 2017-04-19 ENCOUNTER — Encounter (HOSPITAL_COMMUNITY): Payer: Self-pay | Admitting: *Deleted

## 2017-04-19 HISTORY — PX: ESOPHAGOGASTRODUODENOSCOPY: SHX5428

## 2017-04-19 LAB — CBC
HCT: 33 % — ABNORMAL LOW (ref 39.0–52.0)
Hemoglobin: 10.5 g/dL — ABNORMAL LOW (ref 13.0–17.0)
MCH: 29.7 pg (ref 26.0–34.0)
MCHC: 31.8 g/dL (ref 30.0–36.0)
MCV: 93.2 fL (ref 78.0–100.0)
Platelets: 207 10*3/uL (ref 150–400)
RBC: 3.54 MIL/uL — ABNORMAL LOW (ref 4.22–5.81)
RDW: 18.8 % — AB (ref 11.5–15.5)
WBC: 11.8 10*3/uL — AB (ref 4.0–10.5)

## 2017-04-19 LAB — BASIC METABOLIC PANEL
Anion gap: 11 (ref 5–15)
BUN: <5 mg/dL — ABNORMAL LOW (ref 6–20)
CALCIUM: 7.6 mg/dL — AB (ref 8.9–10.3)
CO2: 24 mmol/L (ref 22–32)
CREATININE: 0.53 mg/dL — AB (ref 0.61–1.24)
Chloride: 103 mmol/L (ref 101–111)
Glucose, Bld: 94 mg/dL (ref 65–99)
Potassium: 3.2 mmol/L — ABNORMAL LOW (ref 3.5–5.1)
SODIUM: 138 mmol/L (ref 135–145)

## 2017-04-19 LAB — MAGNESIUM
MAGNESIUM: 1.2 mg/dL — AB (ref 1.7–2.4)
Magnesium: 1.7 mg/dL (ref 1.7–2.4)

## 2017-04-19 LAB — POTASSIUM: POTASSIUM: 3.5 mmol/L (ref 3.5–5.1)

## 2017-04-19 SURGERY — EGD (ESOPHAGOGASTRODUODENOSCOPY)
Anesthesia: Monitor Anesthesia Care

## 2017-04-19 MED ORDER — POTASSIUM CHLORIDE CRYS ER 20 MEQ PO TBCR
30.0000 meq | EXTENDED_RELEASE_TABLET | Freq: Two times a day (BID) | ORAL | Status: DC
  Administered 2017-04-19 – 2017-04-20 (×3): 30 meq via ORAL
  Filled 2017-04-19 (×3): qty 1

## 2017-04-19 MED ORDER — PROPOFOL 10 MG/ML IV BOLUS
INTRAVENOUS | Status: DC | PRN
  Administered 2017-04-19: 30 mg via INTRAVENOUS

## 2017-04-19 MED ORDER — MAGNESIUM SULFATE 2 GM/50ML IV SOLN
2.0000 g | Freq: Once | INTRAVENOUS | Status: AC
  Administered 2017-04-19: 2 g via INTRAVENOUS
  Filled 2017-04-19: qty 50

## 2017-04-19 MED ORDER — FUROSEMIDE 40 MG PO TABS
40.0000 mg | ORAL_TABLET | Freq: Every day | ORAL | Status: DC
  Administered 2017-04-19 – 2017-04-20 (×2): 40 mg via ORAL
  Filled 2017-04-19 (×2): qty 1

## 2017-04-19 MED ORDER — PROPOFOL 500 MG/50ML IV EMUL
INTRAVENOUS | Status: DC | PRN
  Administered 2017-04-19: 125 ug/kg/min via INTRAVENOUS

## 2017-04-19 MED ORDER — LACTATED RINGERS IV SOLN
INTRAVENOUS | Status: AC | PRN
  Administered 2017-04-19: 11:00:00 via INTRAVENOUS
  Administered 2017-04-19: 1000 mL via INTRAVENOUS

## 2017-04-19 MED ORDER — PROPOFOL 10 MG/ML IV BOLUS: INTRAVENOUS | Status: DC | PRN

## 2017-04-19 MED ORDER — POTASSIUM CHLORIDE 10 MEQ/100ML IV SOLN
10.0000 meq | INTRAVENOUS | Status: AC
  Administered 2017-04-19 (×2): 10 meq via INTRAVENOUS
  Filled 2017-04-19 (×2): qty 100

## 2017-04-19 NOTE — Interval H&P Note (Signed)
History and Physical Interval Note:  04/19/2017 11:12 AM  [name]  has presented today for surgery, with the diagnosis of gastrointestinal bleeding, cirrhosis, anemia  The various methods of treatment have been discussed with the patient and family. After consideration of risks, benefits and other options for treatment, the patient has consented to  Procedure(s): ESOPHAGOGASTRODUODENOSCOPY (EGD) (N/A) as a surgical intervention .  The patient's history has been reviewed, patient examined, no change in status, stable for surgery.  I have reviewed the patient's chart and labs.  Questions were answered to the patient's satisfaction.     Nelida Meuse III

## 2017-04-19 NOTE — Anesthesia Preprocedure Evaluation (Signed)
Anesthesia Evaluation  Patient identified by MRN, date of birth, ID band Patient awake    Reviewed: Allergy & Precautions, NPO status , Patient's Chart, lab work & pertinent test results  Airway Mallampati: I       Dental  (+) Poor Dentition, Missing   Pulmonary Current Smoker,    Pulmonary exam normal breath sounds clear to auscultation       Cardiovascular negative cardio ROS Normal cardiovascular exam Rhythm:Regular Rate:Normal     Neuro/Psych    GI/Hepatic Neg liver ROS,   Endo/Other  negative endocrine ROS  Renal/GU negative Renal ROS  negative genitourinary   Musculoskeletal   Abdominal (+) + scaphoid   Peds  Hematology  (+) Blood dyscrasia, anemia ,   Anesthesia Other Findings   Reproductive/Obstetrics                             Anesthesia Physical Anesthesia Plan  ASA: III  Anesthesia Plan: MAC   Post-op Pain Management:    Induction:   PONV Risk Score and Plan: 0  Airway Management Planned: Mask and Natural Airway  Additional Equipment:   Intra-op Plan:   Post-operative Plan:   Informed Consent: I have reviewed the patients History and Physical, chart, labs and discussed the procedure including the risks, benefits and alternatives for the proposed anesthesia with the patient or authorized representative who has indicated his/her understanding and acceptance.     Plan Discussed with: CRNA  Anesthesia Plan Comments:         Anesthesia Quick Evaluation

## 2017-04-19 NOTE — Progress Notes (Signed)
PROGRESS NOTE    [name]  BHA:[id number] DOB: 1965-12-02 DOA: 04/16/2017 PCP: [name], MD   Brief Narrative:  52 y.o. Male  PMHx EtOH Cirrhosis, EtOH abuse, HLD,  Presented w/ c/o abdominal distention x 4 weeks, along w/ black stool for 3 days.     In the ED a CXR suggested a Left lower lobe infiltrate.  WBC was 17.6, Hgb 8.6, Plt 486 FOBT +, and Ammonia 44.  GI was consulted by the ED for ? GI bleeding.     Subjective: 2/21 attempted to see patient on 2 occasions remained in endoscopy lab. No charge     Assessment & Plan:   Principal Problem:   Anemia Active Problems:   Community acquired pneumonia   Alcohol intoxication ([place])   Melena   Tachycardia  EtOH cirrhosis of the liver -S/P paracentesis  Melena -EGD pending. Once cleared by cardiology with echocardiogram GI will perform. -PPI drip  Liver lesions? -Lesions noted on noncontrast CT abdomen -Obtain MRI HCC protocol    LLL pneumonia -Continue empiric antibiotic  Anemia of chronic disease/acute blood loss anemia(baseline HGb 11 June 2016) -Transfused 2 units PRBC -Follow hemoglobin level transfuse for hemoglobin<7 Recent Labs  Lab 04/16/17 1540 04/17/17 0202 04/17/17 1826 04/18/17 0211 04/19/17 0252  HGB 8.6* 7.4* 10.5* 10.4* 10.5*   Recurrent hypoglycemia Likely due to liver disease - pt tolerates remarkable well - follow trend     Hypokalemia -Potassium goal> 4 -Potassium IV 40 mEq -Recheck K/Mg @1200   Hypomagnesemia  -Magnesium goal> 2 -Magnesium IV 2 g     DVT prophylaxis: SCD Code Status: Full Family Communication: None Disposition Plan: TBD   Consultants:  [facility]    Procedures/Significant Events:  2/18 admit 2/19 paracentesis in IR  2/19 CT abdomen pelvis WO contrast:Moderate ascites within the abdomen and pelvis 2. Nodular liver contour consistent with cirrhosis. Liver is poorly defined and very heterogeneous with multifocal hypodense areas  in the parenchyma. When the patient is clinically stable and able to follow directions and hold their breath (preferably as an outpatient) further evaluation with dedicated abdominal MRI should be considered to evaluate for possible liver mass or masses. 3. Small bilateral pleural effusions with partial atelectasis or infiltrates at the lung bases.    I have personally reviewed and interpreted all radiology studies and my findings are as above.  VENTILATOR SETTINGS:    Cultures   Antimicrobials: . Anti-infectives (From admission, onward)   Start     Stop   04/16/17 2315  cefTRIAXone (ROCEPHIN) 1 g in sodium chloride 0.9 % 100 mL IVPB         04/16/17 2315  azithromycin (ZITHROMAX) 500 mg in sodium chloride 0.9 % 250 mL IVPB             Devices    LINES / TUBES:      Continuous Infusions: . azithromycin Stopped (04/19/17 0007)  . cefTRIAXone (ROCEPHIN)  IV Stopped (04/18/17 2310)  . potassium chloride 10 mEq (04/19/17 0831)     Objective: Vitals:   04/18/17 1929 04/18/17 2332 04/19/17 0505 04/19/17 0759  BP: 121/90 114/79 109/79 112/76  Pulse: (!) 104 (!) 110 64 93  Resp: 15 16 14 11   Temp: 99.1 F (37.3 C) 98.6 F (37 C) 98.6 F (37 C) 98.5 F (36.9 C)  TempSrc: Oral Oral Oral Oral  SpO2: 97% 97% 99% 99%  Weight:   139 lb 5.3 oz (63.2 kg)   Height:  Intake/Output Summary (Last 24 hours) at 04/19/2017 0851 Last data filed at 04/19/2017 7902 Gross per 24 hour  Intake 1232.5 ml  Output -  Net 1232.5 ml   Filed Weights   04/17/17 1640 04/19/17 0505  Weight: 139 lb 8.8 oz (63.3 kg) 139 lb 5.3 oz (63.2 kg)    Examination:  Attempted to evaluate patient on 2 occasions in endoscopy lab no charge .     Data Reviewed: Care during the described time interval was provided by me .  I have reviewed this patient's available data, including medical history, events of note, physical examination, and all test results as part of my evaluation.    CBC: Recent Labs  Lab 04/16/17 1540 04/17/17 0202 04/17/17 1826 04/18/17 0211 04/19/17 0252  WBC 17.6* 15.4* 9.2 10.1 11.8*  HGB 8.6* 7.4* 10.5* 10.4* 10.5*  HCT 28.1* 23.7* 33.2* 32.8* 33.0*  MCV 94.6 94.4 91.7 91.6 93.2  PLT 486* 330 233 222 409   Basic Metabolic Panel: Recent Labs  Lab 04/16/17 1540 04/17/17 0202 04/18/17 0211 04/18/17 1405 04/19/17 0252  NA 137 137 139  --  138  K 3.8 3.3* 3.0* 3.6 3.2*  CL 98* 99* 101  --  103  CO2 19* 18* 24  --  24  GLUCOSE 80 43* 139*  --  94  BUN <5* <5* <5*  --  <5*  CREATININE 0.65 0.57* 0.53*  --  0.53*  CALCIUM 8.0* 7.5* 7.5*  --  7.6*  MG  --   --   --  1.3* 1.2*   GFR: Estimated Creatinine Clearance: 95.5 mL/min (A) (by C-G formula based on SCr of 0.53 mg/dL (L)). Liver Function Tests: Recent Labs  Lab 04/17/17 0202 04/18/17 0211  AST 91* 71*  ALT 21 18  ALKPHOS 259* 231*  BILITOT 1.2 1.1  PROT 6.3* 6.2*  ALBUMIN 2.6* 2.5*   Recent Labs  Lab 04/17/17 0202  LIPASE 25   Recent Labs  Lab 04/16/17 1547 04/18/17 0211  AMMONIA 44* 66*   Coagulation Profile: Recent Labs  Lab 04/18/17 0211  INR 1.22   Cardiac Enzymes: Recent Labs  Lab 04/17/17 0202  TROPONINI <0.03   BNP (last 3 results) No results for input(s): PROBNP in the last 8760 hours. HbA1C: No results for input(s): HGBA1C in the last 72 hours. CBG: Recent Labs  Lab 04/17/17 0255 04/17/17 0421 04/17/17 0614 04/17/17 1319  GLUCAP 40* 55* 41* 162*   Lipid Profile: No results for input(s): CHOL, HDL, LDLCALC, TRIG, CHOLHDL, LDLDIRECT in the last 72 hours. Thyroid Function Tests: No results for input(s): TSH, T4TOTAL, FREET4, T3FREE, THYROIDAB in the last 72 hours. Anemia Panel: No results for input(s): VITAMINB12, FOLATE, FERRITIN, TIBC, IRON, RETICCTPCT in the last 72 hours. Urine analysis:    Component Value Date/Time   COLORURINE YELLOW 04/16/2017 2226   APPEARANCEUR HAZY (A) 04/16/2017 2226   LABSPEC 1.016 04/16/2017 2226    PHURINE 6.0 04/16/2017 2226   GLUCOSEU NEGATIVE 04/16/2017 2226   HGBUR NEGATIVE 04/16/2017 2226   BILIRUBINUR NEGATIVE 04/16/2017 2226   KETONESUR 80 (A) 04/16/2017 2226   PROTEINUR NEGATIVE 04/16/2017 2226   UROBILINOGEN 1.0 10/20/2014 1026   NITRITE NEGATIVE 04/16/2017 2226   LEUKOCYTESUR NEGATIVE 04/16/2017 2226   Sepsis Labs: @LABRCNTIP (procalcitonin:4,lacticidven:4)  ) Recent Results (from the past 240 hour(s))  MRSA PCR Screening     Status: None   Collection Time: 04/17/17  3:12 PM  Result Value Ref Range Status   MRSA by PCR NEGATIVE NEGATIVE  Final    Comment:        The GeneXpert MRSA Assay (FDA approved for NASAL specimens only), is one component of a comprehensive MRSA colonization surveillance program. It is not intended to diagnose MRSA infection nor to guide or monitor treatment for MRSA infections. Performed at [facility], [place] [address] [place], [place] [address]   Gram stain     Status: None   Collection Time: 04/17/17  3:42 PM  Result Value Ref Range Status   Specimen Description PERITONEAL  Final   Special Requests NONE  Final   Gram Stain   Final    FEW WBC PRESENT, PREDOMINANTLY MONONUCLEAR NO ORGANISMS SEEN Performed at [facility], [address], [place], [place] [address]    Report Status 04/17/2017 FINAL  Final  Culture, body fluid-bottle     Status: None (Preliminary result)   Collection Time: 04/17/17  3:42 PM  Result Value Ref Range Status   Specimen Description PERITONEAL  Final   Special Requests NONE  Final   Culture   Final    NO GROWTH < 24 HOURS Performed at [facility], [place] [address], [place], [place] [address]    Report Status PENDING  Incomplete         Radiology Studies: Ir Paracentesis  Result Date: 04/17/2017 INDICATION: Patient with history of cirrhosis, ascites. Request is made for diagnostic and therapeutic paracentesis. EXAM: ULTRASOUND GUIDED DIAGNOSTIC AND THERAPEUTIC PARACENTESIS  MEDICATIONS: 10 mL 2% lidocaine COMPLICATIONS: None immediate. PROCEDURE: Informed written consent was obtained from the patient after a discussion of the risks, benefits and alternatives to treatment. A timeout was performed prior to the initiation of the procedure. Initial ultrasound scanning demonstrates a moderate amount of ascites within the right lower abdominal quadrant. The right lower abdomen was prepped and draped in the usual sterile fashion. 2% lidocaine was used for local anesthesia. Following this, a 19 gauge, 7-cm, Yueh catheter was introduced. An ultrasound image was saved for documentation purposes. The paracentesis was performed. The catheter was removed and a dressing was applied. The patient tolerated the procedure well without immediate post procedural complication. FINDINGS: A total of approximately 3.0 liters of yellow fluid was removed. Samples were sent to the laboratory as requested by the clinical team. IMPRESSION: Successful ultrasound-guided diagnostic and therapeutic paracentesis yielding 3.0 liters of peritoneal fluid. Read by: Brynda Greathouse PA-C Electronically Signed   By: Jacqulynn Cadet M.D.   On: 04/17/2017 15:50        Scheduled Meds: . folic acid  1 mg Oral Daily  . lactulose  10 g Oral Daily  . pantoprazole  40 mg Oral BID AC  . spironolactone  100 mg Oral Daily  . thiamine  100 mg Oral Daily   Continuous Infusions: . azithromycin Stopped (04/19/17 0007)  . cefTRIAXone (ROCEPHIN)  IV Stopped (04/18/17 2310)  . potassium chloride 10 mEq (04/19/17 0831)     LOS: 3 days    Time spent: 40 minutes    Lonnie Rosado, Geraldo Docker, MD [facility] Pager [phone]   If 7PM-7AM, please contact night-coverage [email or web address] Password [facility] 04/19/2017, 8:51 AM

## 2017-04-19 NOTE — Care Management Note (Addendum)
Case Management Note  Patient Details  Name: [name] MRN: [id number] Date of Birth: 23-Jun-1965  Subjective/Objective:       Pt admitted for abdominal swelling, GI bleed and  and CIWA   - pt has hx of cirrhosis with ascites.  Pt is s/p paracentesis         Action/Plan:  PTA from home.  CSW consulted for current substance abuse.  Pt is active with [facility] and gets prescriptions filled at clinic pharmacy.  CM encouraged pt to request post discharge follow up appt.     Expected Discharge Date:                  Expected Discharge Plan:  Home/Self Care  In-House Referral:     Discharge planning Services  CM Consult  Post Acute Care Choice:    Choice offered to:     DME Arranged:    DME Agency:     HH Arranged:    HH Agency:     Status of Service:     If discussed at [employer] of Stay Meetings, dates discussed:    Additional Comments:  Maryclare Labrador, RN 04/19/2017, 2:34 PM

## 2017-04-19 NOTE — Anesthesia Postprocedure Evaluation (Signed)
Anesthesia Post Note  Patient: [name]  Procedure(s) Performed: ESOPHAGOGASTRODUODENOSCOPY (EGD) (N/A )     Patient location during evaluation: Endoscopy Anesthesia Type: MAC Level of consciousness: awake Pain management: pain level controlled Vital Signs Assessment: post-procedure vital signs reviewed and stable Respiratory status: spontaneous breathing Cardiovascular status: stable Postop Assessment: no apparent nausea or vomiting Anesthetic complications: no    Last Vitals:  Vitals:   04/19/17 1021 04/19/17 1147  BP: 119/83 107/67  Pulse:  (!) 102  Resp: 13 17  Temp: 37.2 C 36.6 C  SpO2: 98% 100%    Last Pain:  Vitals:   04/19/17 1147  TempSrc: Oral  PainSc:    Pain Goal: Patients Stated Pain Goal: 0 (04/18/17 2220)               [place]

## 2017-04-19 NOTE — Transfer of Care (Signed)
Immediate Anesthesia Transfer of Care Note  Patient: [name]  Procedure(s) Performed: ESOPHAGOGASTRODUODENOSCOPY (EGD) (N/A )  Patient Location: Endoscopy Unit  Anesthesia Type:MAC  Level of Consciousness: awake, alert , oriented and patient cooperative  Airway & Oxygen Therapy: Patient Spontanous Breathing and Patient connected to nasal cannula oxygen  Post-op Assessment: Report given to RN and Post -op Vital signs reviewed and stable  Post vital signs: Reviewed and stable  Last Vitals:  Vitals:   04/19/17 0759 04/19/17 1021  BP: 112/76 119/83  Pulse: 93   Resp: 11 13  Temp: 36.9 C 37.2 C  SpO2: 99% 98%    Last Pain:  Vitals:   04/19/17 1021  TempSrc: Oral  PainSc:       Patients Stated Pain Goal: 0 (84/53/64 6803)  Complications: No apparent anesthesia complications

## 2017-04-19 NOTE — Op Note (Signed)
[facility] Patient Name: [name] Procedure Date : 04/19/2017 MRN: [id number] Attending MD: Estill Cotta. Loletha Carrow , MD Date of Birth: 1965-09-12 CSN: [id number] Age: 52 Admit Type: Inpatient Procedure:                Upper GI endoscopy Indications:              Cirrhosis rule out esophageal varices, normocytic                            anemia Providers:                Mallie Mussel L. Loletha Carrow, MD, Kingsley Plan, RN, Elspeth Cho Tech., Technician, Charolette Child,                            Technician, Gershon Crane CRNA, CRNA Referring MD:              Medicines:                Monitored Anesthesia Care Complications:            No immediate complications. Estimated Blood Loss:     Estimated blood loss: none. Procedure:                Pre-Anesthesia Assessment:                           - Prior to the procedure, a History and Physical                            was performed, and patient medications and                            allergies were reviewed. The patient's tolerance of                            previous anesthesia was also reviewed. The risks                            and benefits of the procedure and the sedation                            options and risks were discussed with the patient.                            All questions were answered, and informed consent                            was obtained. Prior Anticoagulants: The patient has                            taken no previous anticoagulant or antiplatelet                            agents.  ASA Grade Assessment: III - A patient with                            severe systemic disease. After reviewing the risks                            and benefits, the patient was deemed in                            satisfactory condition to undergo the procedure.                           After obtaining informed consent, the endoscope was                            passed under  direct vision. Throughout the                            procedure, the patient's blood pressure, pulse, and                            oxygen saturations were monitored continuously. The                            [id number] ([id number]) scope was introduced through the                            mouth, and advanced to the second part of duodenum.                            The upper GI endoscopy was accomplished without                            difficulty. The patient tolerated the procedure                            well. Scope In: Scope Out: Findings:      The esophagus was normal.      There is no endoscopic evidence of varices in the entire esophagus.      The stomach was normal.      There is no endoscopic evidence of varices in the cardia.      The cardia and gastric fundus were normal on retroflexion.      The examined duodenum was normal. Impression:               - Normal esophagus.                           - Normal stomach.                           - Normal examined duodenum.                           - No specimens collected.                           -  This appears to be anemia of chronic disease. No                            UGI bleeding source found despite recent report of                            "dark stool". Moderate Sedation:      MAC sedation used Recommendation:           - Return patient to hospital ward for possible                            discharge same day.                           - Low sodium diet indefinitely.                           - Continue spironolactone 100 mg once daily                           - Start furosemide 40 mg once daily.                           - BMP in 5-7 days.                           - Continue present medications.                           - Patient can be discharged home any time from a GI                            perspective. When that occurs, he sould be on                             ciprofloxacin 750 mg once weekly for primary                            prophylaxis of SBP since ascitic albumin < 1.0                           As noted yesterday, if medical service does not                            think this patient has pneumonia, please                            discontinue other antibiotics. Procedure Code(s):        --- Professional ---                           [phone], Esophagogastroduodenoscopy, flexible,                            transoral; diagnostic, including  collection of                            specimen(s) by brushing or washing, when performed                            (separate procedure) Diagnosis Code(s):        --- Professional ---                           K74.60, Unspecified cirrhosis of liver CPT copyright 2016 American Medical Association. All rights reserved. The codes documented in this report are preliminary and upon coder review may  be revised to meet current compliance requirements. Shirah Roseman L. Loletha Carrow, MD 04/19/2017 11:54:38 AM This report has been signed electronically. Number of Addenda: 0

## 2017-04-20 DIAGNOSIS — D62 Acute posthemorrhagic anemia: Secondary | ICD-10-CM

## 2017-04-20 DIAGNOSIS — K922 Gastrointestinal hemorrhage, unspecified: Secondary | ICD-10-CM

## 2017-04-20 DIAGNOSIS — K7031 Alcoholic cirrhosis of liver with ascites: Secondary | ICD-10-CM

## 2017-04-20 DIAGNOSIS — D600 Chronic acquired pure red cell aplasia: Secondary | ICD-10-CM

## 2017-04-20 DIAGNOSIS — K7030 Alcoholic cirrhosis of liver without ascites: Secondary | ICD-10-CM

## 2017-04-20 DIAGNOSIS — J181 Lobar pneumonia, unspecified organism: Secondary | ICD-10-CM

## 2017-04-20 LAB — MAGNESIUM: MAGNESIUM: 1.5 mg/dL — AB (ref 1.7–2.4)

## 2017-04-20 LAB — BASIC METABOLIC PANEL
Anion gap: 12 (ref 5–15)
CO2: 22 mmol/L (ref 22–32)
Calcium: 7.8 mg/dL — ABNORMAL LOW (ref 8.9–10.3)
Chloride: 104 mmol/L (ref 101–111)
Creatinine, Ser: 0.45 mg/dL — ABNORMAL LOW (ref 0.61–1.24)
Glucose, Bld: 92 mg/dL (ref 65–99)
POTASSIUM: 3.8 mmol/L (ref 3.5–5.1)
SODIUM: 138 mmol/L (ref 135–145)

## 2017-04-20 LAB — CBC
HCT: 35.2 % — ABNORMAL LOW (ref 39.0–52.0)
HEMOGLOBIN: 11.1 g/dL — AB (ref 13.0–17.0)
MCH: 29.4 pg (ref 26.0–34.0)
MCHC: 31.5 g/dL (ref 30.0–36.0)
MCV: 93.1 fL (ref 78.0–100.0)
Platelets: 212 10*3/uL (ref 150–400)
RBC: 3.78 MIL/uL — ABNORMAL LOW (ref 4.22–5.81)
RDW: 18.6 % — AB (ref 11.5–15.5)
WBC: 12.8 10*3/uL — ABNORMAL HIGH (ref 4.0–10.5)

## 2017-04-20 MED ORDER — MAGNESIUM SULFATE 2 GM/50ML IV SOLN
2.0000 g | Freq: Once | INTRAVENOUS | Status: AC
  Administered 2017-04-20: 2 g via INTRAVENOUS
  Filled 2017-04-20: qty 50

## 2017-04-20 MED ORDER — CEFIXIME 400 MG PO CAPS: 400.0000 mg | ORAL_CAPSULE | Freq: Every day | ORAL | 0 refills | Status: DC

## 2017-04-20 MED ORDER — THIAMINE HCL 100 MG PO TABS: 100.0000 mg | ORAL_TABLET | Freq: Every day | ORAL | 0 refills | Status: DC

## 2017-04-20 MED ORDER — CEFIXIME 400 MG PO CAPS
400.0000 mg | ORAL_CAPSULE | Freq: Every day | ORAL | Status: DC
  Administered 2017-04-20: 400 mg via ORAL
  Filled 2017-04-20: qty 1

## 2017-04-20 MED ORDER — AZITHROMYCIN 250 MG PO TABS: ORAL_TABLET | ORAL | 0 refills | Status: DC

## 2017-04-20 MED ORDER — FOLIC ACID 1 MG PO TABS: 1.0000 mg | ORAL_TABLET | Freq: Every day | ORAL | 0 refills | Status: DC

## 2017-04-20 MED ORDER — AZITHROMYCIN 500 MG PO TABS: 250.0000 mg | ORAL_TABLET | Freq: Every day | ORAL | Status: DC

## 2017-04-20 MED ORDER — AZITHROMYCIN 250 MG PO TABS
250.0000 mg | ORAL_TABLET | Freq: Every day | ORAL | Status: DC
  Administered 2017-04-20: 250 mg via ORAL
  Filled 2017-04-20: qty 1

## 2017-04-20 MED ORDER — LACTULOSE 10 GM/15ML PO SOLN: 10.0000 g | Freq: Every day | ORAL | 0 refills | Status: DC

## 2017-04-20 MED ORDER — CEFIXIME 400 MG PO CAPS: 400.0000 mg | ORAL_CAPSULE | Freq: Every day | ORAL | Status: DC

## 2017-04-20 MED ORDER — SPIRONOLACTONE 100 MG PO TABS: 100.0000 mg | ORAL_TABLET | Freq: Every day | ORAL | 0 refills | Status: DC

## 2017-04-20 MED ORDER — FUROSEMIDE 40 MG PO TABS: 40.0000 mg | ORAL_TABLET | Freq: Every day | ORAL | 0 refills | Status: DC

## 2017-04-20 MED FILL — SUPRAX 400 MG CAPSULE: 400 | 3 days supply | Qty: 3 | Fill #0

## 2017-04-20 MED FILL — ?FOLIC ACID 1 MG TABLET: 1 | 30 days supply | Qty: 30 | Fill #0

## 2017-04-20 MED FILL — LACTULOSE Solution 10g/15ml: 10 | 16 days supply | Qty: 240 | Fill #0

## 2017-04-20 MED FILL — ?SPIRONOLACTONE 100 MG TAB: 100 | 30 days supply | Qty: 30 | Fill #0

## 2017-04-20 MED FILL — ?FUROSEMIDE 40 MG TABLET: 40 | 30 days supply | Qty: 30 | Fill #0

## 2017-04-20 MED FILL — AZITHROMYCIN 250 MG TABLET: 250 | 3 days supply | Qty: 3 | Fill #0

## 2017-04-20 NOTE — Progress Notes (Signed)
Discharge Planning- Pt has appt to follow up at [facility] on 04/30/2017 at 1:45 pm. Information on discharge instructions. CSW referral for follow up on drug rehab programs outpatient. Jonnie Finner RN CCM Case Mgmt phone [phone]

## 2017-04-20 NOTE — Discharge Summary (Signed)
Physician Discharge Summary  [name] HWE:[id number] DOB: 1965-03-24 DOA: 04/16/2017  PCP: Ian Pier, MD  Admit date: 04/16/2017 Discharge date: 04/20/2017  Time spent: 35 minutes  Recommendations for Outpatient Follow-up:   EtOH cirrhosis of the liver -S/P paracentesis: 3.0 L yellow fluid  Liver lesions? -Lesions noted on noncontrast CT abdomen -Obtain MRI HCC protocol  -Schedule follow-up with Dr. Karle Barnes in 1-2 weeks EtOH cirrhosis of liver, possible liver lesions. Will require MRI liver protocol   Melena -EGD normal see results below. .   LLL pneumonia/CAP -Complete course of antibiotics.  Acute Diastolic CHF -Echocardiogram consistent with CHF see results below -Currently patient's BP would not tolerate CHF medication. Will allow patient's PCP to decide when/if to start appropriate medication.   Anemia of chronic disease/acute blood loss anemia(baseline HGb 11 June 2016) -Transfused 2 units PRBC during this hospitalization -Follow hemoglobin level transfuse for hemoglobin<7   Recurrent hypoglycemia -Likely due to liver disease  -Resolved     Hypokalemia -Potassium goal> 4   Hypomagnesemia  -Magnesium goal> 2 -Magnesium IV 2 g prior to discharge      Discharge Diagnoses:  Principal Problem:   Anemia Active Problems:   Community acquired pneumonia   Alcohol intoxication ([place])   Melena   Tachycardia   Discharge Condition: Stable  Diet recommendation:   Filed Weights   04/19/17 0505 04/19/17 1021 04/20/17 0438  Weight: 139 lb 5.3 oz (63.2 kg) 140 lb (63.5 kg) 137 lb 9.6 oz (62.4 kg)    History of present illness:  52 y.o. Male  PMHx EtOH Cirrhosis, EtOH abuse, HLD,   Presented w/ c/o abdominal distention x 4 weeks, along w/ black stool for 3 days.     In the ED a CXR suggested a Left lower lobe infiltrate.  WBC was 17.6, Hgb 8.6, Plt 486 FOBT +, and Ammonia 44.  GI was consulted by the ED for ? GI bleeding During this  hospitalization patient treated for anemia of chronic disease/acute blood loss anemia. Patient received EGD which was negative for acute source of bleed. Patient's bleeding most likely secondary to his alcohol abuse. Currently hemoglobin level stable. Stable for discharge.   Procedures: 2/18 admit 2/19 paracentesis in IR : 3.0 L yellow fluid 2/19 CT abdomen pelvis WO contrast:Moderate ascites within the abdomen and pelvis -Nodular liver contour consistent with cirrhosis. -multifocal hypodense areas in the parenchyma. When the patient is clinically stable and able to follow directions and hold their breath (preferably as an outpatient) further evaluation with dedicated abdominal MRI should be considered to evaluate for possible liver mass or masses. - Small bilateral pleural effusions with partial atelectasis or infiltrates at the lung bases. 2/20 Echocardiogram: LVEF=: 55% to 60%. Wall motion was normal; -Grade 1 diastolic dysfunction 8/93 EGD:-Esophagus was normal.-Negative varices in the entire esophagus. -stomach was normal. -Negative evidence of varices in the cardia.-duodenum was normal.    Consultations: GI  Cultures   2/19 peritoneal fluid negative malignant cells. NGTD     Antibiotics Anti-infectives (From admission, onward)   Start     Stop   04/20/17 1000  azithromycin (ZITHROMAX) tablet 250 mg  Status:  Discontinued     04/20/17 0758   04/20/17 1000  Cefixime (SUPRAX) capsule 400 mg  Status:  Discontinued     04/20/17 0758   04/20/17 1000  azithromycin (ZITHROMAX) tablet 250 mg     04/22/17 2359   04/20/17 1000  Cefixime (SUPRAX) capsule 400 mg     04/22/17 2359  04/16/17 2315  cefTRIAXone (ROCEPHIN) 1 g in sodium chloride 0.9 % 100 mL IVPB  Status:  Discontinued     04/20/17 0756   04/16/17 2315  azithromycin (ZITHROMAX) 500 mg in sodium chloride 0.9 % 250 mL IVPB  Status:  Discontinued     04/20/17 0756        Discharge Exam: Vitals:   04/19/17 2337  04/20/17 0332 04/20/17 0438 04/20/17 0701  BP: 106/90 93/70  100/68  Pulse: (!) 105 (!) 107    Resp: 12 (!) 30    Temp: 98.3 F (36.8 C) 98.5 F (36.9 C)  98.4 F (36.9 C)  TempSrc: Oral Oral  Oral  SpO2: 100% 98%  98%  Weight:   137 lb 9.6 oz (62.4 kg)   Height:        General: A/O 4, No acute respiratory distress, cachectic Eyes: negative scleral hemorrhage, negative anisocoria, negative icterus Neck:  Negative scars, masses, torticollis, lymphadenopathy, JVD Lungs: Clear to auscultation bilaterally without wheezes or crackles Cardiovascular: Regular rate and rhythm without murmur gallop or rub normal S1 and S2 Abdomen: negative abdominal pain, nondistended, positive soft, bowel sounds, no rebound, no ascites, no appreciable mass   Discharge Instructions   Allergies as of 04/20/2017   No Known Allergies     Medication List    STOP taking these medications   ibuprofen 200 MG tablet Commonly known as:  ADVIL,MOTRIN     TAKE these medications   azithromycin 250 MG tablet Commonly known as:  ZITHROMAX 1 TAB BY MOUTH DAILY   Cefixime 400 MG Caps capsule Commonly known as:  SUPRAX Take 1 capsule (400 mg total) by mouth daily.   folic acid 1 MG tablet Commonly known as:  FOLVITE Take 1 tablet (1 mg total) by mouth daily. Start taking on:  04/21/2017   furosemide 40 MG tablet Commonly known as:  LASIX Take 1 tablet (40 mg total) by mouth daily. Start taking on:  04/21/2017   lactulose 10 GM/15ML solution Commonly known as:  CHRONULAC Take 15 mLs (10 g total) by mouth daily. Start taking on:  04/21/2017 What changed:  when to take this   spironolactone 100 MG tablet Commonly known as:  ALDACTONE Take 1 tablet (100 mg total) by mouth daily. Start taking on:  04/21/2017   thiamine 100 MG tablet Take 1 tablet (100 mg total) by mouth daily. Start taking on:  04/21/2017      No Known Allergies Follow-up Information    Ian Pier, MD. Go on 04/30/2017.    Specialty:  Internal Medicine Why:  Follow-up appoitnment with [name] scheduled for 04/30/2017 at 1:45 EtOH cirrhosis of liver, possible liver lesions. Will require MRI liver protocol Contact information: [place] [address] [phone]            The results of significant diagnostics from this hospitalization (including imaging, microbiology, ancillary and laboratory) are listed below for reference.    Significant Diagnostic Studies: Ct Abdomen Pelvis Wo Contrast  Result Date: 04/17/2017 CLINICAL DATA:  Shortness of breath and abdominal swelling EXAM: CT ABDOMEN AND PELVIS WITHOUT CONTRAST TECHNIQUE: Multidetector CT imaging of the abdomen and pelvis was performed following the standard protocol without IV contrast. COMPARISON:  Ultrasound abdomen 06/25/2015, CT abdomen 10/20/2014 FINDINGS: Lower chest: Small left greater than right pleural effusions. Partial consolidations in the left greater than right lung bases and the lingula, likely atelectasis with pneumonia not excluded. Coronary vascular calcification. Normal heart size. Hepatobiliary: Poorly  defined liver with heterogenous hypodense areas in the liver parenchyma. Nodular contour consistent with cirrhosis. No calcified gallstones. No biliary dilatation Pancreas: Unremarkable. No pancreatic ductal dilatation or surrounding inflammatory changes. Spleen: Normal in size without focal abnormality. Adrenals/Urinary Tract: Adrenal glands are unremarkable. Kidneys are normal, without renal calculi, focal lesion, or hydronephrosis. Bladder is unremarkable. Stomach/Bowel: Stomach is within normal limits. Appendix not well seen but no right lower quadrant inflammation. No evidence of bowel wall thickening, distention, or inflammatory changes. Vascular/Lymphatic: Marked aortic atherosclerosis without aneurysmal dilatation. No significant pelvic nodes. Small upper abdominal lymph nodes measuring up to 9 mm in size.  Reproductive: Prostate is unremarkable. Other: Fat in the inguinal canals. No free air. Moderate ascites within the abdomen and pelvis. Musculoskeletal: No acute or significant osseous findings. IMPRESSION: 1. Moderate ascites within the abdomen and pelvis 2. Nodular liver contour consistent with cirrhosis. Liver is poorly defined and very heterogeneous with multifocal hypodense areas in the parenchyma. When the patient is clinically stable and able to follow directions and hold their breath (preferably as an outpatient) further evaluation with dedicated abdominal MRI should be considered to evaluate for possible liver mass or masses. 3. Small bilateral pleural effusions with partial atelectasis or infiltrates at the lung bases. Electronically Signed   By: Donavan Foil M.D.   On: 04/17/2017 01:52   Dg Chest 2 View  Result Date: 04/16/2017 CLINICAL DATA:  Patient here with increasing SOB and abdominal swelling x 2-3 weeks. Patient has cirrhosis and continuing to drink ETOH. Alert but slow to respond. Skin with yellowish tent. No chest hx per pt. EXAM: CHEST  2 VIEW COMPARISON:  06/25/2015 FINDINGS: Heart size is normal. There is linear and patchy density at both lung bases, more confluent at the left. Small left pleural effusion is present. No pulmonary edema. IMPRESSION: Left lower lobe infiltrate.  Bibasilar atelectasis. Electronically Signed   By: Nolon Nations M.D.   On: 04/16/2017 17:09   Ir Paracentesis  Result Date: 04/17/2017 INDICATION: Patient with history of cirrhosis, ascites. Request is made for diagnostic and therapeutic paracentesis. EXAM: ULTRASOUND GUIDED DIAGNOSTIC AND THERAPEUTIC PARACENTESIS MEDICATIONS: 10 mL 2% lidocaine COMPLICATIONS: None immediate. PROCEDURE: Informed written consent was obtained from the patient after a discussion of the risks, benefits and alternatives to treatment. A timeout was performed prior to the initiation of the procedure. Initial ultrasound scanning  demonstrates a moderate amount of ascites within the right lower abdominal quadrant. The right lower abdomen was prepped and draped in the usual sterile fashion. 2% lidocaine was used for local anesthesia. Following this, a 19 gauge, 7-cm, Yueh catheter was introduced. An ultrasound image was saved for documentation purposes. The paracentesis was performed. The catheter was removed and a dressing was applied. The patient tolerated the procedure well without immediate post procedural complication. FINDINGS: A total of approximately 3.0 liters of yellow fluid was removed. Samples were sent to the laboratory as requested by the clinical team. IMPRESSION: Successful ultrasound-guided diagnostic and therapeutic paracentesis yielding 3.0 liters of peritoneal fluid. Read by: Brynda Greathouse PA-C Electronically Signed   By: Jacqulynn Cadet M.D.   On: 04/17/2017 15:50    Microbiology: Recent Results (from the past 240 hour(s))  MRSA PCR Screening     Status: None   Collection Time: 04/17/17  3:12 PM  Result Value Ref Range Status   MRSA by PCR NEGATIVE NEGATIVE Final    Comment:        The GeneXpert MRSA Assay (FDA approved for NASAL specimens only),  is one component of a comprehensive MRSA colonization surveillance program. It is not intended to diagnose MRSA infection nor to guide or monitor treatment for MRSA infections. Performed at [facility], [place] [address] [place], [place] [address]   Gram stain     Status: None   Collection Time: 04/17/17  3:42 PM  Result Value Ref Range Status   Specimen Description PERITONEAL  Final   Special Requests NONE  Final   Gram Stain   Final    FEW WBC PRESENT, PREDOMINANTLY MONONUCLEAR NO ORGANISMS SEEN Performed at [facility], [address], [place], [place] [address]    Report Status 04/17/2017 FINAL  Final  Culture, body fluid-bottle     Status: None (Preliminary result)   Collection Time: 04/17/17  3:42 PM  Result Value Ref Range  Status   Specimen Description PERITONEAL  Final   Special Requests NONE  Final   Culture   Final    NO GROWTH 2 DAYS Performed at [facility] [address] [place],  [address]    Report Status PENDING  Incomplete     Labs: Basic Metabolic Panel: Recent Labs  Lab 04/16/17 1540 04/17/17 0202 04/18/17 0211 04/18/17 1405 04/19/17 0252 04/19/17 1317 04/20/17 0421  NA 137 137 139  --  138  --  138  K 3.8 3.3* 3.0* 3.6 3.2* 3.5 3.8  CL 98* 99* 101  --  103  --  104  CO2 19* 18* 24  --  24  --  22  GLUCOSE 80 43* 139*  --  94  --  92  BUN <5* <5* <5*  --  <5*  --  <5*  CREATININE 0.65 0.57* 0.53*  --  0.53*  --  0.45*  CALCIUM 8.0* 7.5* 7.5*  --  7.6*  --  7.8*  MG  --   --   --  1.3* 1.2* 1.7 1.5*   Liver Function Tests: Recent Labs  Lab 04/17/17 0202 04/18/17 0211  AST 91* 71*  ALT 21 18  ALKPHOS 259* 231*  BILITOT 1.2 1.1  PROT 6.3* 6.2*  ALBUMIN 2.6* 2.5*   Recent Labs  Lab 04/17/17 0202  LIPASE 25   Recent Labs  Lab 04/16/17 1547 04/18/17 0211  AMMONIA 44* 66*   CBC: Recent Labs  Lab 04/17/17 0202 04/17/17 1826 04/18/17 0211 04/19/17 0252 04/20/17 0421  WBC 15.4* 9.2 10.1 11.8* 12.8*  HGB 7.4* 10.5* 10.4* 10.5* 11.1*  HCT 23.7* 33.2* 32.8* 33.0* 35.2*  MCV 94.4 91.7 91.6 93.2 93.1  PLT 330 233 222 207 212   Cardiac Enzymes: Recent Labs  Lab 04/17/17 0202  TROPONINI <0.03   BNP: BNP (last 3 results) No results for input(s): BNP in the last 8760 hours.  ProBNP (last 3 results) No results for input(s): PROBNP in the last 8760 hours.  CBG: Recent Labs  Lab 04/17/17 0255 04/17/17 0421 04/17/17 0614 04/17/17 1319  GLUCAP 40* 55* 41* 162*       Signed:  Dia Crawford, MD [facility] [phone] pager

## 2017-04-20 NOTE — Progress Notes (Signed)
Pt discharged per MD order, all discharge instructions reviewed and all questions answered. Discharge instructions reviewed with sister as well.

## 2017-04-20 NOTE — Clinical Social Work Note (Signed)
Clinical Social Work Assessment  Patient Details  Name: [name] MRN: [id number] Date of Birth: May 23, 1965  Date of referral:  04/20/17               Reason for consult:  Substance Use/ETOH Abuse                Permission sought to share information with:  Family Supports Permission granted to share information::  Yes, Verbal Permission Granted  Name::     [name]  Agency::  [facility]  Relationship::  Sister  Contact Information:  [phone]  Housing/Transportation Living arrangements for the past 2 months:  [place] of Information:  Patient, Medical Team, Siblings Patient Interpreter Needed:  None Criminal Activity/Legal Involvement Pertinent to Current Situation/Hospitalization:  No - Comment as needed Significant Relationships:  Siblings, Parents Lives with:    Do you feel safe going back to the place where you live?  Yes Need for family participation in patient care:  Yes (Comment)  Care giving concerns:  Substance abuse.   Social Worker assessment / plan:  CSW met with patient. Sister at bedside. CSW introduced role and inquired about interest in substance abuse resources. Patient's sister has an intake appointment for him at [facility] in [facility] on Tuesday at 8:00 am. She has been in contact with [name] at the facility. They asked that she have the social worker call to confirm medications, etc. CSW called and spoke with [name] as [name] was unavailable. She stated that patient just needed ID and medications/prescriptions when he arrived to the appointment. Patient confirmed he has ID. Since he is discharging today, [name] said it was fine if he got his prescriptions filled at the pharmacy and brought them to the appointment with him since there is a 4 day gap between today and the appointment. Patient and his sister notified and expressed understanding. CSW also provided patient's sister with list of local residential and outpatient  treatment centers. No further concerns. CSW signing off as social work intervention is no longer needed.   Employment status:  Cytogeneticist information:  Self Pay (Medicaid Pending) PT Recommendations:  Not assessed at this time Information / Referral to community resources:  Residential Substance Abuse Treatment Options, Outpatient Substance Abuse Treatment Options  Patient/Family's Response to care:  Patient and his sister agreeable to receiving resources. Patient's mother and sister supportive and involved in patient's care. Patient and his sister appreciated social work intervention.  Patient/Family's Understanding of and Emotional Response to Diagnosis, Current Treatment, and Prognosis:  Patient and his sister have a good understanding of the reason for admission and social work consult. Patient and his sister appear happy with hospital care.  Emotional Assessment Appearance:  Appears stated age Attitude/Demeanor/Rapport:  Engaged, Gracious Affect (typically observed):  Accepting, Appropriate, Calm, Pleasant Orientation:  Oriented to Self, Oriented to Place, Oriented to  Time, Oriented to Situation Alcohol / Substance use:  Alcohol Use Psych involvement (Current and /or in the community):  No (Comment)  Discharge Needs  Concerns to be addressed:  Care Coordination Readmission within the last 30 days:  No Current discharge risk:  Substance Abuse Barriers to Discharge:  No Barriers Identified   Ian Chroman, LCSW 04/20/2017, 1:28 PM

## 2017-04-22 LAB — CULTURE, BODY FLUID W GRAM STAIN -BOTTLE: Culture: NO GROWTH

## 2017-04-22 LAB — CULTURE, BODY FLUID-BOTTLE

## 2017-04-23 ENCOUNTER — Telehealth: Payer: Self-pay | Admitting: Internal Medicine

## 2017-04-23 NOTE — Telephone Encounter (Signed)
Pt called to say that in his rehab appointment they told him his medications must say one refill.He would like to know if this affects his chart in any way

## 2017-04-23 NOTE — Telephone Encounter (Signed)
Contacted pt and he stated that [facility] wants him to have 1 refill on medication before his appointment. I informed pt that PCP will have to look at hospital notes before she sends a 1 refill on medication.   Contacted [facility] and spoke with Lashanda(nurse) and she stated that pt is going to need a 1 refill on rx because I informed her that is on an antibiotic and they don't refill those. Per Caryl Ada pt is just going to have to reschedule appointment after PCP looks over hospital note and medications.   Contacted pt back and informed him of what the nurse said from [facility] and pt states he understands he will call ad reschedule and I informed pt that I will contact him once PCP looks over everything

## 2017-04-24 MED ORDER — FUROSEMIDE 40 MG PO TABS: 40.0000 mg | ORAL_TABLET | Freq: Every day | ORAL | 6 refills | Status: DC

## 2017-04-24 MED ORDER — LACTULOSE 10 GM/15ML PO SOLN: 10.0000 g | Freq: Every day | ORAL | 6 refills | Status: DC

## 2017-04-24 MED ORDER — THIAMINE HCL 100 MG PO TABS: 100.0000 mg | ORAL_TABLET | Freq: Every day | ORAL | 4 refills | Status: DC

## 2017-04-24 MED ORDER — FOLIC ACID 1 MG PO TABS: 1.0000 mg | ORAL_TABLET | Freq: Every day | ORAL | 3 refills | Status: DC

## 2017-04-24 NOTE — Addendum Note (Signed)
Addended by: [name] B on: 04/24/2017 08:25 AM   Modules accepted: Orders

## 2017-04-24 NOTE — Telephone Encounter (Signed)
Contacted pt lvm informing pt that Dr. Wynetta Emery sent in medication refills to the pharmacy and he will need to contact [facility] to schedule his appointment

## 2017-04-30 ENCOUNTER — Telehealth: Payer: Self-pay

## 2017-04-30 ENCOUNTER — Ambulatory Visit: Payer: Self-pay | Attending: Internal Medicine | Admitting: Internal Medicine

## 2017-04-30 ENCOUNTER — Other Ambulatory Visit: Payer: Self-pay

## 2017-04-30 ENCOUNTER — Encounter: Payer: Self-pay | Admitting: Internal Medicine

## 2017-04-30 VITALS — BP 150/82 | HR 94 | Temp 98.1°F | Resp 16 | Ht 66.0 in | Wt 129.2 lb

## 2017-04-30 DIAGNOSIS — F17210 Nicotine dependence, cigarettes, uncomplicated: Secondary | ICD-10-CM | POA: Insufficient documentation

## 2017-04-30 DIAGNOSIS — K7030 Alcoholic cirrhosis of liver without ascites: Secondary | ICD-10-CM | POA: Insufficient documentation

## 2017-04-30 DIAGNOSIS — G4700 Insomnia, unspecified: Secondary | ICD-10-CM | POA: Insufficient documentation

## 2017-04-30 DIAGNOSIS — F419 Anxiety disorder, unspecified: Secondary | ICD-10-CM | POA: Insufficient documentation

## 2017-04-30 DIAGNOSIS — Z79899 Other long term (current) drug therapy: Secondary | ICD-10-CM | POA: Insufficient documentation

## 2017-04-30 DIAGNOSIS — D500 Iron deficiency anemia secondary to blood loss (chronic): Secondary | ICD-10-CM | POA: Insufficient documentation

## 2017-04-30 DIAGNOSIS — E785 Hyperlipidemia, unspecified: Secondary | ICD-10-CM | POA: Insufficient documentation

## 2017-04-30 DIAGNOSIS — Z8701 Personal history of pneumonia (recurrent): Secondary | ICD-10-CM | POA: Insufficient documentation

## 2017-04-30 DIAGNOSIS — F329 Major depressive disorder, single episode, unspecified: Secondary | ICD-10-CM | POA: Insufficient documentation

## 2017-04-30 DIAGNOSIS — K769 Liver disease, unspecified: Secondary | ICD-10-CM | POA: Insufficient documentation

## 2017-04-30 DIAGNOSIS — F1010 Alcohol abuse, uncomplicated: Secondary | ICD-10-CM

## 2017-04-30 DIAGNOSIS — F32A Depression, unspecified: Secondary | ICD-10-CM

## 2017-04-30 MED ORDER — ZOLPIDEM TARTRATE ER 6.25 MG PO TBCR: 6.2500 mg | EXTENDED_RELEASE_TABLET | Freq: Every evening | ORAL | 1 refills | Status: DC | PRN

## 2017-04-30 MED ORDER — MIRTAZAPINE 15 MG PO TABS: 15.0000 mg | ORAL_TABLET | Freq: Every day | ORAL | 1 refills | Status: DC

## 2017-04-30 MED FILL — MIRTAZAPINE 15 MG TAB: 15 | 30 days supply | Qty: 30 | Fill #0

## 2017-04-30 MED FILL — ZOLPIDEM TART ER 6.25 MG TA: 6.25 | 30 days supply | Qty: 30 | Fill #0

## 2017-04-30 NOTE — Patient Instructions (Signed)
Continue Remeron. Use the Ambien as needed for insomnia.  Decrease Lactulose to 10 ml daily. Goal is to have at least 3-4 loose stools daily.

## 2017-04-30 NOTE — Telephone Encounter (Signed)
Caryl Ada from [facility] called and stated Dr. Wynetta Emery sent him back with 2 rx's and no refills on his other medicines. I informed her that he does have refills on his rx and they were sent to his pharmacy. Caryl Ada stated that how are they suppose to get them I informed Caryl Ada that pt called before he went to rehab and stated he needed refills on medication and if he couldn't get them he wouldn't be able to go. Caryl Ada then stated she will have to call pt sister to see if she will pick up medications from [facility]. Caryl Ada then stated Dr. Beola Cord wrote his a rx for [facility] and he is not allowed to have it but they have trazadone and seraquil. I informed Caryl Ada that I will have to speak with Dr. Wynetta Emery regarding this and I will have to contact her back.   I spoke with Dr. Wynetta Emery regarding this and she stated for pt to take the remeron till he gets out. I contacted Caryl Ada back and informed her of this and she stated well pt has been taking this and it has not helped and he will need something to help him sleep because she will have to deal with him tonight and if he can't get anything for sleep he will be kicked out and she will inform her Market researcher. I informed her that I will let Dr. Wynetta Emery know.   Few minutes late pt called and stated what is going on I informed pt of the situation and pt stated that he needs something for sleep and they have trazadone and seraquil. Pt then stated he has taking trazadone before. I informed pt that I will have to speak with Dr. Wynetta Emery about it but right now she has patients so it will be after clinic. Pt states he would like for [place] to call Lash[place] when Dr. Wynetta Emery comes up with something

## 2017-04-30 NOTE — Progress Notes (Signed)
Patient ID: [name], male    DOB: 11/24/1965  MRN: [id number]  CC: Hospitalization Follow-up   Subjective: [name] is a 52 y.o. male who presents for hosp f/u His concerns today include:  Pt with hx of ETOH cirrhosis, tobacco dep, insomnia and hyperlipidemia  Patient hospitalized 2/18-22/2019 with left lower lobe pneumonia and decompensated cirrhosis. -had 3 Lt ascites fluid removed from abdomen.  No peritonitis. EGD neg for varices and ulcers. Found to have multifocal hypodense areas in the liver.  MRI recommended as outpatient. -Found to be anemic with heme positive stools.  Transfuse 2 units PRBC Found to have grade 1 DD on echo with EF 55-60% -Post hospital patient went to [facility] for 30-day rehab  Today: Currently at [facility] -taking Lactulose 11ml daily.  Having 6 loose stools/day which interferes with his classes/counseling sessions at [facility].  No black stools or blood in stools.  Eating better Craving for ETOH has decreased.  He quit smoking Problems sleeping at nights. Very anxious At [facility] almost 1 wk.  Plan to be there a minimal of 30 days  His main complaint today is not sleeping well.  He is on Remeron 15 mg which helps with his appetite and anxious feelings but he is still not able to sleep.  He is requesting to be put back on Ambien. Patient Active Problem List   Diagnosis Date Noted  . Ascites due to alcoholic cirrhosis ([place])   . Gastrointestinal hemorrhage   . Acute on chronic blood loss anemia (HCC)   . Community acquired pneumonia 04/16/2017  . Alcohol intoxication ([place]) 04/16/2017  . Melena 04/16/2017  . Tachycardia 04/16/2017  . Hyperlipidemia 06/09/2016  . Anemia 11/27/2014  . Insomnia 11/26/2014  . Liver cirrhosis ([place]) 09/21/2014  . Tobacco abuse 03/15/2011     Current Outpatient Medications on File Prior to Visit  Medication Sig Dispense Refill  . Cefixime (SUPRAX) 400 MG CAPS capsule Take 1 capsule (400 mg total) by  mouth daily. (Patient not taking: Reported on 04/30/2017) 3 capsule 0  . folic acid (FOLVITE) 1 MG tablet Take 1 tablet (1 mg total) by mouth daily. 30 tablet 3  . furosemide (LASIX) 40 MG tablet Take 1 tablet (40 mg total) by mouth daily. 30 tablet 6  . lactulose (CHRONULAC) 10 GM/15ML solution Take 15 mLs (10 g total) by mouth daily. 240 mL 6  . spironolactone (ALDACTONE) 100 MG tablet Take 1 tablet (100 mg total) by mouth daily. 30 tablet 0  . thiamine 100 MG tablet Take 1 tablet (100 mg total) by mouth daily. 30 tablet 4   No current facility-administered medications on file prior to visit.     No Known Allergies  Social History   Socioeconomic History  . Marital status: Legally Separated    Spouse name: Not on file  . Number of children: Not on file  . Years of education: Not on file  . Highest education level: Not on file  Social Needs  . Financial resource strain: Not on file  . Food insecurity - worry: Not on file  . Food insecurity - inability: Not on file  . Transportation needs - medical: Not on file  . Transportation needs - non-medical: Not on file  Occupational History  . Not on file  Tobacco Use  . Smoking status: Current Every Day Smoker    Packs/day: 0.50    Types: Cigarettes  . Smokeless tobacco: Never Used  Substance and Sexual Activity  . Alcohol use:  Yes    Comment: pt states last drink was 3 days ago  . Drug use: No  . Sexual activity: No  Other Topics Concern  . Not on file  Social History Narrative  . Not on file    Family History  Problem Relation Age of Onset  . Coronary artery disease Unknown   . Diabetes type II Unknown     Past Surgical History:  Procedure Laterality Date  . ESOPHAGOGASTRODUODENOSCOPY N/A 04/19/2017   Procedure: ESOPHAGOGASTRODUODENOSCOPY (EGD);  Surgeon: Doran Stabler, MD;  Location: [place];  Service: Gastroenterology;  Laterality: N/A;  . IR PARACENTESIS  04/17/2017  . NO PAST SURGERIES      ROS: Review  of Systems Negative except as stated above PHYSICAL EXAM: BP (!) 150/82   Pulse 94   Temp 98.1 F (36.7 C) (Oral)   Resp 16   Ht 5\' 6"  (1.676 m)   Wt 129 lb 3.2 oz (58.6 kg)   SpO2 98%   BMI 20.85 kg/m   Wt Readings from Last 3 Encounters:  04/30/17 129 lb 3.2 oz (58.6 kg)  04/20/17 137 lb 9.6 oz (62.4 kg)  04/12/17 144 lb 12.8 oz (65.7 kg)    Physical Exam General appearance - alert, well appearing, and in no distress Mental status - alert, oriented to person, place, and time, normal mood, behavior, speech, dress, motor activity, and thought processes Mouth - mucous membranes moist, pharynx normal without lesions Neck - supple, no significant adenopathy Chest - clear to auscultation, no wheezes, rales or rhonchi, symmetric air entry Heart - normal rate, regular rhythm, normal S1, S2, no murmurs, rubs, clicks or gallops Abdomen - nondistended, + liver enlargement. nontender Extremities - no LE edema  Results for orders placed or performed in visit on 04/30/17  CBC  Result Value Ref Range   WBC 14.6 (H) 3.4 - 10.8 x10E3/uL   RBC 3.68 (L) 4.14 - 5.80 x10E6/uL   Hemoglobin 10.9 (L) 13.0 - 17.7 g/dL   Hematocrit 33.8 (L) 37.5 - 51.0 %   MCV 92 79 - 97 fL   MCH 29.6 26.6 - 33.0 pg   MCHC 32.2 31.5 - 35.7 g/dL   RDW 15.9 (H) 12.3 - 15.4 %   Platelets 484 (H) 150 - 379 x10E3/uL  Comprehensive metabolic panel  Result Value Ref Range   Glucose 87 65 - 99 mg/dL   BUN 14 6 - 24 mg/dL   Creatinine, Ser 0.74 (L) 0.76 - 1.27 mg/dL   GFR calc non Af Amer 105 >59 mL/min/1.73   GFR calc Af Amer 122 >59 mL/min/1.73   BUN/Creatinine Ratio 19 9 - 20   Sodium 143 134 - 144 mmol/L   Potassium 3.9 3.5 - 5.2 mmol/L   Chloride 100 96 - 106 mmol/L   CO2 25 20 - 29 mmol/L   Calcium 8.8 8.7 - 10.2 mg/dL   Total Protein 7.6 6.0 - 8.5 g/dL   Albumin 3.6 3.5 - 5.5 g/dL   Globulin, Total 4.0 1.5 - 4.5 g/dL   Albumin/Globulin Ratio 0.9 (L) 1.2 - 2.2   Bilirubin Total 0.4 0.0 - 1.2 mg/dL    Alkaline Phosphatase 168 (H) 39 - 117 IU/L   AST 70 (H) 0 - 40 IU/L   ALT 29 0 - 44 IU/L   Depression screen [facility] 2/9 04/30/2017 04/12/2017 01/23/2017  Decreased Interest 2 2 1   Down, Depressed, Hopeless 2 2 1   PHQ - 2 Score 4 4 2   Altered sleeping  3 2 1   Tired, decreased energy 2 2 1   Change in appetite 2 2 2   Feeling bad or failure about yourself  1 2 1   Trouble concentrating 1 2 2   Moving slowly or fidgety/restless 2 2 2   Suicidal thoughts 0 1 0  PHQ-9 Score 15 17 11   Difficult doing work/chores - - -    GAD 7 : Generalized Anxiety Score 04/30/2017 04/12/2017 01/23/2017 05/05/2016  Nervous, Anxious, on Edge 2 1 1  0  Control/stop worrying 2 1 2 1   Worry too much - different things 2 1 2 1   Trouble relaxing 2 2 2 1   Restless 1 1 2 1   Easily annoyed or irritable 1 1 2 1   Afraid - awful might happen 1 1 1  0  Total GAD 7 Score 11 8 12 5   Anxiety Difficulty - - - -     ASSESSMENT AND PLAN: 1. Alcoholic cirrhosis of liver without ascites ([place]) Commended him on going to the rehab program.  Encouraged him to stay at least 1-2 months.  It is imperative that he abstain from alcohol to prevent progression of his liver disease. -We will schedule MRI of the abdomen to evaluate the liver lesions. -He will need to see GI for follow-up.  He has the paperwork for the orange card/cone discount but has not filled out as yet.  I have encouraged him to do so ASAP - CBC - Comprehensive metabolic panel  2. Anxiety and depression - mirtazapine (REMERON) 15 MG tablet; Take 1 tablet (15 mg total) by mouth at bedtime.  Dispense: 30 tablet; Refill: 1  3. Iron deficiency anemia due to chronic blood loss - CBC  4. Liver lesion - MR LIVER W CONTRAST; Future  6. Insomnia, unspecified type -Patient mentally seems to be in a different place compared to when I saw him several weeks ago prior to hospitalization.  We will put him back on low-dose Ambien.  If it causes any mental confusion for him or if he  returns to drinking, we will have to discontinue. - zolpidem (AMBIEN CR) 6.25 MG CR tablet; Take 1 tablet (6.25 mg total) by mouth at bedtime as needed for sleep.  Dispense: 30 tablet; Refill: 1  Patient was given the opportunity to ask questions.  Patient verbalized understanding of the plan and was able to repeat key elements of the plan.   Orders Placed This Encounter  Procedures  . MR LIVER W CONTRAST  . CBC  . Comprehensive metabolic panel     Requested Prescriptions   Signed Prescriptions Disp Refills  . zolpidem (AMBIEN CR) 6.25 MG CR tablet 30 tablet 1    Sig: Take 1 tablet (6.25 mg total) by mouth at bedtime as needed for sleep.  . mirtazapine (REMERON) 15 MG tablet 30 tablet 1    Sig: Take 1 tablet (15 mg total) by mouth at bedtime.    Return in about 6 weeks (around 06/11/2017).  [name], MD, FACP

## 2017-05-01 ENCOUNTER — Telehealth: Payer: Self-pay | Admitting: Internal Medicine

## 2017-05-01 LAB — COMPREHENSIVE METABOLIC PANEL
ALBUMIN: 3.6 g/dL (ref 3.5–5.5)
ALT: 29 IU/L (ref 0–44)
AST: 70 IU/L — ABNORMAL HIGH (ref 0–40)
Albumin/Globulin Ratio: 0.9 — ABNORMAL LOW (ref 1.2–2.2)
Alkaline Phosphatase: 168 IU/L — ABNORMAL HIGH (ref 39–117)
BUN / CREAT RATIO: 19 (ref 9–20)
BUN: 14 mg/dL (ref 6–24)
Bilirubin Total: 0.4 mg/dL (ref 0.0–1.2)
CALCIUM: 8.8 mg/dL (ref 8.7–10.2)
CO2: 25 mmol/L (ref 20–29)
CREATININE: 0.74 mg/dL — AB (ref 0.76–1.27)
Chloride: 100 mmol/L (ref 96–106)
GFR, EST AFRICAN AMERICAN: 122 mL/min/{1.73_m2} (ref >59)
GFR, EST NON AFRICAN AMERICAN: 105 mL/min/{1.73_m2} (ref >59)
GLOBULIN, TOTAL: 4 g/dL (ref 1.5–4.5)
GLUCOSE: 87 mg/dL (ref 65–99)
Potassium: 3.9 mmol/L (ref 3.5–5.2)
Sodium: 143 mmol/L (ref 134–144)
TOTAL PROTEIN: 7.6 g/dL (ref 6.0–8.5)

## 2017-05-01 LAB — CBC
HEMOGLOBIN: 10.9 g/dL — AB (ref 13.0–17.7)
Hematocrit: 33.8 % — ABNORMAL LOW (ref 37.5–51.0)
MCH: 29.6 pg (ref 26.6–33.0)
MCHC: 32.2 g/dL (ref 31.5–35.7)
MCV: 92 fL (ref 79–97)
Platelets: 484 10*3/uL — ABNORMAL HIGH (ref 150–379)
RBC: 3.68 x10E6/uL — AB (ref 4.14–5.80)
RDW: 15.9 % — ABNORMAL HIGH (ref 12.3–15.4)
WBC: 14.6 10*3/uL — ABNORMAL HIGH (ref 3.4–10.8)

## 2017-05-01 MED ORDER — TRAZODONE HCL 50 MG PO TABS: 25.0000 mg | ORAL_TABLET | Freq: Every evening | ORAL | 3 refills | Status: DC | PRN

## 2017-05-01 MED ORDER — FERROUS SULFATE 325 (65 FE) MG PO TABS: 325.0000 mg | ORAL_TABLET | Freq: Every day | ORAL | 1 refills | Status: DC

## 2017-05-01 NOTE — Telephone Encounter (Signed)
I spoke with nurse Darcella Cheshire and [facility] in [place].  She reports that they cannot give the patient Ambien that was prescribed on his visit yesterday as they are rehab facility. Rxn was placed in a lock box and will be given to him when he is discharged from their facility.  However she reports that patient is wanting something to help with sleep and threatening to leave if he does not get something. She reports that he is doing very well with his treatment and they do not want him to leave.  I will prescribe Trazodone.   I also informed her to let patient know that he is still anemic.  I would like to add daily iron supplement.  Both prescription for iron and trazodone will be sent to [facility].

## 2017-05-02 DIAGNOSIS — K769 Liver disease, unspecified: Secondary | ICD-10-CM | POA: Insufficient documentation

## 2017-05-02 DIAGNOSIS — F329 Major depressive disorder, single episode, unspecified: Secondary | ICD-10-CM | POA: Insufficient documentation

## 2017-05-02 DIAGNOSIS — F419 Anxiety disorder, unspecified: Secondary | ICD-10-CM

## 2017-05-02 DIAGNOSIS — F1011 Alcohol abuse, in remission: Secondary | ICD-10-CM | POA: Insufficient documentation

## 2017-05-09 ENCOUNTER — Other Ambulatory Visit: Payer: Self-pay | Admitting: Internal Medicine

## 2017-05-09 ENCOUNTER — Ambulatory Visit (HOSPITAL_COMMUNITY)
Admission: RE | Admit: 2017-05-09 | Discharge: 2017-05-09 | Disposition: A | Discharge status: Home / Self Care | Payer: Self-pay | Source: Ambulatory Visit | Attending: Internal Medicine | Admitting: Internal Medicine

## 2017-05-09 ENCOUNTER — Telehealth: Payer: Self-pay | Admitting: Internal Medicine

## 2017-05-09 DIAGNOSIS — K769 Liver disease, unspecified: Secondary | ICD-10-CM

## 2017-05-09 DIAGNOSIS — R591 Generalized enlarged lymph nodes: Secondary | ICD-10-CM | POA: Insufficient documentation

## 2017-05-09 DIAGNOSIS — K7460 Unspecified cirrhosis of liver: Secondary | ICD-10-CM | POA: Insufficient documentation

## 2017-05-09 DIAGNOSIS — I700 Atherosclerosis of aorta: Secondary | ICD-10-CM | POA: Insufficient documentation

## 2017-05-09 DIAGNOSIS — I8500 Esophageal varices without bleeding: Secondary | ICD-10-CM | POA: Insufficient documentation

## 2017-05-09 MED ORDER — GADOBENATE DIMEGLUMINE 529 MG/ML IV SOLN
11.0000 mL | Freq: Once | INTRAVENOUS | Status: AC | PRN
  Administered 2017-05-09: 11 mL via INTRAVENOUS

## 2017-05-09 MED ORDER — TRAZODONE HCL 50 MG PO TABS: 100.0000 mg | ORAL_TABLET | Freq: Every evening | ORAL | 0 refills | Status: DC | PRN

## 2017-05-09 MED FILL — FOLIC ACID 1 MG TABLET: 1 | 30 days supply | Qty: 30 | Fill #0

## 2017-05-09 MED FILL — FUROSEMIDE 40 MG TAB: 40 | 30 days supply | Qty: 30 | Fill #0

## 2017-05-09 MED FILL — LACTULOSE Solution 10g/15ml: 10 | 16 days supply | Qty: 240 | Fill #0

## 2017-05-09 NOTE — Telephone Encounter (Signed)
[facility] called for a fu with sleep medication. He told them that the trazodone is not work and wants something else please fu with [place] asap. At [place]

## 2017-05-09 NOTE — Telephone Encounter (Signed)
Phone call returned to [place].  She reports that patient feels the trazodone is not helping.  He is requesting to try a higher dose.  He is currently receiving 50 mg at bedtime.  We will increase 100 mg

## 2017-05-11 ENCOUNTER — Telehealth: Payer: Self-pay

## 2017-05-11 NOTE — Telephone Encounter (Signed)
Contacted pt to go over MRI results pt didn't answer left a vm asking pt to give me a call at his earliest convenience   If pt calls back please give results: MRI of his abdomen revealed no masses or suggestive cancerous lesions in the liver. Please inquire whether he has completed enrollment for the orange card/cone discount as I do need to refer him to see a gastroenterologist because of his cirrhosis.

## 2017-05-15 ENCOUNTER — Other Ambulatory Visit: Payer: Self-pay

## 2017-05-15 MED ORDER — SPIRONOLACTONE 100 MG PO TABS: 100.0000 mg | ORAL_TABLET | Freq: Every day | ORAL | 3 refills | Status: DC

## 2017-05-23 ENCOUNTER — Ambulatory Visit: Payer: Self-pay | Attending: Internal Medicine

## 2017-06-01 MED FILL — MIRTAZAPINE 15 MG TAB: 15 | 30 days supply | Qty: 30 | Fill #1

## 2017-06-01 MED FILL — LACTULOSE Solution 10g/15ml: 10 | 16 days supply | Qty: 240 | Fill #1

## 2017-06-01 MED FILL — ZOLPIDEM TART ER 6.25 MG TA: 6.25 | 30 days supply | Qty: 30 | Fill #1

## 2017-06-22 ENCOUNTER — Encounter: Payer: Self-pay | Admitting: Internal Medicine

## 2017-06-22 ENCOUNTER — Ambulatory Visit: Payer: Self-pay | Attending: Internal Medicine | Admitting: Internal Medicine

## 2017-06-22 VITALS — BP 104/73 | HR 93 | Temp 98.6°F | Resp 16 | Wt 137.6 lb

## 2017-06-22 DIAGNOSIS — L720 Epidermal cyst: Secondary | ICD-10-CM

## 2017-06-22 DIAGNOSIS — L729 Follicular cyst of the skin and subcutaneous tissue, unspecified: Secondary | ICD-10-CM | POA: Insufficient documentation

## 2017-06-22 DIAGNOSIS — E785 Hyperlipidemia, unspecified: Secondary | ICD-10-CM | POA: Insufficient documentation

## 2017-06-22 DIAGNOSIS — F17210 Nicotine dependence, cigarettes, uncomplicated: Secondary | ICD-10-CM | POA: Insufficient documentation

## 2017-06-22 DIAGNOSIS — G4700 Insomnia, unspecified: Secondary | ICD-10-CM

## 2017-06-22 DIAGNOSIS — K7030 Alcoholic cirrhosis of liver without ascites: Secondary | ICD-10-CM

## 2017-06-22 DIAGNOSIS — F419 Anxiety disorder, unspecified: Secondary | ICD-10-CM

## 2017-06-22 DIAGNOSIS — F329 Major depressive disorder, single episode, unspecified: Secondary | ICD-10-CM

## 2017-06-22 DIAGNOSIS — Z79899 Other long term (current) drug therapy: Secondary | ICD-10-CM | POA: Insufficient documentation

## 2017-06-22 DIAGNOSIS — F1010 Alcohol abuse, uncomplicated: Secondary | ICD-10-CM | POA: Insufficient documentation

## 2017-06-22 MED ORDER — MIRTAZAPINE 15 MG PO TABS: 7.5000 mg | ORAL_TABLET | Freq: Every day | ORAL | 2 refills | Status: DC

## 2017-06-22 NOTE — Progress Notes (Addendum)
Patient ID: [name] Record, male    DOB: December 04, 1965  MRN: [id number]  CC: Follow-up   Subjective: [name] is a 52 y.o. male who presents for chronic ds management.  Mother is with him His concerns today include:  Pt with hx of ETOH cirrhosis, tobacco dep, insomnia and hyperlipidemia  ETOH abuse:  discgh from [facility] 05/22/2017. He has been free of ETOH x 2 mths -not taking Lactulose because it was causing too much diarrhea. Compliant with Furosemide and Spironolactone No LE edema.  No abdominal fluid inc.  MRI without hepatic masses Taking Ambien for insomnia.  Denies any inc daytime drowsiness Now has OC so can be referred to GI  Small cyst on RT chest wall.  Inc in size recently.  Patient Active Problem List   Diagnosis Date Noted  . Anxiety and depression 05/02/2017  . Liver lesion 05/02/2017  . Alcohol abuse 05/02/2017  . Ascites due to alcoholic cirrhosis ([place])   . Gastrointestinal hemorrhage   . Tachycardia 04/16/2017  . Hyperlipidemia 06/09/2016  . Anemia 11/27/2014  . Insomnia 11/26/2014  . Liver cirrhosis ([place]) 09/21/2014  . Tobacco abuse 03/15/2011     Current Outpatient Medications on File Prior to Visit  Medication Sig Dispense Refill  . Cefixime (SUPRAX) 400 MG CAPS capsule Take 1 capsule (400 mg total) by mouth daily. (Patient not taking: Reported on 04/30/2017) 3 capsule 0  . ferrous sulfate (FERROUSUL) 325 (65 FE) MG tablet Take 1 tablet (325 mg total) by mouth daily with breakfast. 60 tablet 1  . folic acid (FOLVITE) 1 MG tablet Take 1 tablet (1 mg total) by mouth daily. 30 tablet 3  . furosemide (LASIX) 40 MG tablet Take 1 tablet (40 mg total) by mouth daily. 30 tablet 6  . lactulose (CHRONULAC) 10 GM/15ML solution Take 15 mLs (10 g total) by mouth daily. 240 mL 6  . spironolactone (ALDACTONE) 100 MG tablet Take 1 tablet (100 mg total) by mouth daily. 30 tablet 3  . thiamine 100 MG tablet Take 1 tablet (100 mg total) by mouth daily. 30  tablet 4  . zolpidem (AMBIEN CR) 6.25 MG CR tablet Take 1 tablet (6.25 mg total) by mouth at bedtime as needed for sleep. 30 tablet 1   No current facility-administered medications on file prior to visit.     No Known Allergies  Social History   Socioeconomic History  . Marital status: Legally Separated    Spouse name: Not on file  . Number of children: Not on file  . Years of education: Not on file  . Highest education level: Not on file  Occupational History  . Not on file  Social Needs  . Financial resource strain: Not on file  . Food insecurity:    Worry: Not on file    Inability: Not on file  . Transportation needs:    Medical: Not on file    Non-medical: Not on file  Tobacco Use  . Smoking status: Current Every Day Smoker    Packs/day: 0.50    Types: Cigarettes  . Smokeless tobacco: Never Used  Substance and Sexual Activity  . Alcohol use: Yes    Comment: pt states last drink was 3 days ago  . Drug use: No  . Sexual activity: Never  Lifestyle  . Physical activity:    Days per week: Not on file    Minutes per session: Not on file  . Stress: Not on file  Relationships  . Social connections:  Talks on phone: Not on file    Gets together: Not on file    Attends religious service: Not on file    Active member of club or organization: Not on file    Attends meetings of clubs or organizations: Not on file    Relationship status: Not on file  . Intimate partner violence:    Fear of current or ex partner: Not on file    Emotionally abused: Not on file    Physically abused: Not on file    Forced sexual activity: Not on file  Other Topics Concern  . Not on file  Social History Narrative  . Not on file    Family History  Problem Relation Age of Onset  . Coronary artery disease Unknown   . Diabetes type II Unknown     Past Surgical History:  Procedure Laterality Date  . ESOPHAGOGASTRODUODENOSCOPY N/A 04/19/2017   Procedure: ESOPHAGOGASTRODUODENOSCOPY  (EGD);  Surgeon: Doran Stabler, MD;  Location: [place];  Service: Gastroenterology;  Laterality: N/A;  . IR PARACENTESIS  04/17/2017  . NO PAST SURGERIES      ROS: Review of Systems Neg except as stated above PHYSICAL EXAM: BP 104/73   Pulse 93   Temp 98.6 F (37 C) (Oral)   Resp 16   Wt 137 lb 9.6 oz (62.4 kg)   SpO2 98%   BMI 22.21 kg/m   Physical Exam  General appearance - alert, well appearing, and in no distress Mental status - alert, oriented to person, place, and time, normal mood, behavior, speech, dress, motor activity, and thought processes Chest - clear to auscultation, no wheezes, rales or rhonchi, symmetric air entry Heart - normal rate, regular rhythm, normal S1, S2, no murmurs, rubs, clicks or gallops Abdomen - soft, nontender, nondistended, no masses or organomegaly Extremities - peripheral pulses normal, no pedal edema, no clubbing or cyanosis Skin: 1-2 cm soft movable cyst RT upper anterior chest wall ASSESSMENT AND PLAN: 1. Alcoholic cirrhosis of liver without ascites ([place]) Commended on being ETOH free.  Continue abstinence -try Lactulose 1 tsp daily to achieve 2-3 soft stools/day - Ambulatory referral to Gastroenterology  2. Insomnia, unspecified type -He is on Ambien and tolerating okay Control Subst prescribing agreement reviewed and pt signed.    - mirtazapine (REMERON) 15 MG tablet; Take 0.5 tablets (7.5 mg total) by mouth at bedtime.  Dispense: 15 tablet; Refill: 2  3. Anxiety and depression - mirtazapine (REMERON) 15 MG tablet; Take 0.5 tablets (7.5 mg total) by mouth at bedtime.  Dispense: 15 tablet; Refill: 2  4. Cyst of skin and subcutaneous tissue - Ambulatory referral to Dermatology  Patient was given the opportunity to ask questions.  Patient verbalized understanding of the plan and was able to repeat key elements of the plan.   Orders Placed This Encounter  Procedures  . Ambulatory referral to Gastroenterology  . Ambulatory  referral to Dermatology     Requested Prescriptions   Signed Prescriptions Disp Refills  . mirtazapine (REMERON) 15 MG tablet 15 tablet 2    Sig: Take 0.5 tablets (7.5 mg total) by mouth at bedtime.    Return in about 3 months (around 09/21/2017).  [name], MD, FACP

## 2017-07-02 ENCOUNTER — Other Ambulatory Visit: Payer: Self-pay | Admitting: Internal Medicine

## 2017-07-02 DIAGNOSIS — G4700 Insomnia, unspecified: Secondary | ICD-10-CM

## 2017-07-03 ENCOUNTER — Encounter: Payer: Self-pay | Admitting: Internal Medicine

## 2017-07-03 MED FILL — ZOLPIDEM TART ER 6.25 MG TA: 6.25 | 30 days supply | Qty: 30 | Fill #0

## 2017-07-03 NOTE — Progress Notes (Signed)
Receive RF request on Ambien.  [facility] reviewed and is appropriate.  Rf done for Ambien CR  6.25 mg #30 with 1 RF.

## 2017-07-16 ENCOUNTER — Telehealth: Payer: Self-pay | Admitting: Internal Medicine

## 2017-07-16 NOTE — Telephone Encounter (Signed)
Will forward to pcp

## 2017-07-16 NOTE — Telephone Encounter (Signed)
Patient called wanting a to talk to his nurse about medication and increasing the dosage for zolpidem (AMBIEN CR) 6.25 MG CR tablet.  There was also a medication he wanted to get refilled but I could not find it on his med chart he said it started with tr and that you knew what it was.  Please Follow Up [phone]

## 2017-07-17 NOTE — Telephone Encounter (Signed)
I have had this conversation with pt a few times in the past already.  I will not increase the dose on the Ambien given his hx of cirrhosis.  He is on Remeron and Ambien at bedtime.

## 2017-07-18 MED FILL — traZODone HCL 50 MG TABS: 50 | 30 days supply | Qty: 60 | Fill #0

## 2017-07-19 NOTE — Telephone Encounter (Signed)
Contacted pt and made aware of Dr. Johnson response  

## 2017-07-24 MED FILL — FUROSEMIDE 40 MG TAB: 40 | 30 days supply | Qty: 30 | Fill #1

## 2017-07-24 MED FILL — FOLIC ACID 1 MG TABS: 1 | 30 days supply | Qty: 30 | Fill #1

## 2017-07-30 ENCOUNTER — Other Ambulatory Visit: Payer: Self-pay | Admitting: Internal Medicine

## 2017-07-30 DIAGNOSIS — G4700 Insomnia, unspecified: Secondary | ICD-10-CM

## 2017-07-30 DIAGNOSIS — F419 Anxiety disorder, unspecified: Principal | ICD-10-CM

## 2017-07-30 DIAGNOSIS — F329 Major depressive disorder, single episode, unspecified: Secondary | ICD-10-CM

## 2017-07-31 MED FILL — ZOLPIDEM TART ER 6.25 MG TA: 6.25 | 30 days supply | Qty: 30 | Fill #1

## 2017-08-01 ENCOUNTER — Other Ambulatory Visit: Payer: Self-pay

## 2017-08-01 MED ORDER — SPIRONOLACTONE 100 MG PO TABS: 100.0000 mg | ORAL_TABLET | Freq: Every day | ORAL | 5 refills | Status: DC

## 2017-08-01 MED FILL — SPIRONOLACTONE 100 MG TABS: 100 | 30 days supply | Qty: 30 | Fill #0

## 2017-08-01 MED FILL — MIRTAZAPINE 15 MG TAB: 15 | 30 days supply | Qty: 30 | Fill #0

## 2017-08-24 ENCOUNTER — Telehealth: Payer: Self-pay | Admitting: Internal Medicine

## 2017-08-24 NOTE — Telephone Encounter (Signed)
Patient called and states that he has the OC and CAFA. Patient would like to have his Dermatology referral processed. Please f/u

## 2017-08-24 NOTE — Telephone Encounter (Signed)
I look in [facility] program and the card is not there . I send a message to [place] Ropes because maybe she haven't entered yet  The name was wrong in gccn  . I'll send his referral next month for Dermatology referral .  Thank You

## 2017-08-28 ENCOUNTER — Other Ambulatory Visit: Payer: Self-pay | Admitting: Internal Medicine

## 2017-08-28 DIAGNOSIS — G4700 Insomnia, unspecified: Secondary | ICD-10-CM

## 2017-08-29 ENCOUNTER — Telehealth: Payer: Self-pay

## 2017-08-29 ENCOUNTER — Other Ambulatory Visit: Payer: Self-pay | Admitting: Internal Medicine

## 2017-08-29 DIAGNOSIS — G4700 Insomnia, unspecified: Secondary | ICD-10-CM

## 2017-08-29 NOTE — Telephone Encounter (Signed)
Contacted pt and made aware that ambien rx is ready for pickup

## 2017-08-31 MED FILL — ZOLPIDEM TART ER 6.25 MG TA: 6.25 | 30 days supply | Qty: 30 | Fill #0

## 2017-09-14 ENCOUNTER — Encounter: Payer: Self-pay | Admitting: Internal Medicine

## 2017-09-14 ENCOUNTER — Ambulatory Visit: Payer: Self-pay | Attending: Internal Medicine | Admitting: Internal Medicine

## 2017-09-14 VITALS — BP 105/73 | HR 107 | Temp 98.4°F | Resp 16 | Wt 142.6 lb

## 2017-09-14 DIAGNOSIS — Z79899 Other long term (current) drug therapy: Secondary | ICD-10-CM | POA: Insufficient documentation

## 2017-09-14 DIAGNOSIS — F17210 Nicotine dependence, cigarettes, uncomplicated: Secondary | ICD-10-CM | POA: Insufficient documentation

## 2017-09-14 DIAGNOSIS — K7030 Alcoholic cirrhosis of liver without ascites: Secondary | ICD-10-CM | POA: Insufficient documentation

## 2017-09-14 DIAGNOSIS — F1010 Alcohol abuse, uncomplicated: Secondary | ICD-10-CM | POA: Insufficient documentation

## 2017-09-14 DIAGNOSIS — M19049 Primary osteoarthritis, unspecified hand: Secondary | ICD-10-CM | POA: Insufficient documentation

## 2017-09-14 DIAGNOSIS — F5104 Psychophysiologic insomnia: Secondary | ICD-10-CM | POA: Insufficient documentation

## 2017-09-14 DIAGNOSIS — D649 Anemia, unspecified: Secondary | ICD-10-CM | POA: Insufficient documentation

## 2017-09-14 MED ORDER — TRAZODONE HCL 50 MG PO TABS: 25.0000 mg | ORAL_TABLET | Freq: Every evening | ORAL | 3 refills | Status: DC | PRN

## 2017-09-14 NOTE — Progress Notes (Signed)
Patient ID: [name], male    DOB: 1965/12/17  MRN: [id number]  CC: Follow-up   Subjective: [name] is a 52 y.o. male who presents for chronic disease management. His concerns today include:  Pt with hx of ETOH cirrhosis, tobacco dep, insomnia, anx/dep and hyperlipidemia  He has not drank in almost 6 months.  Referred to GI on last visit.  He was called while he was out of town and accidentally deleted the voicemail. Not taking lactulose every day. Gained 6 lbs.  Eating healthy.  Energy level better Main issue is chronic insomnia despite being on Ambien.  Gets in bed 10 p.m and turns off all lights and sounds.  He has a hard time falling asleep.  Gets up and watches TV.  Then up at 4 a.m.  Out of bed around 5 to be to work at 6 a.m.  Tried taking 1.5 of Ambien tablets and found that he slept a little better..  Patient Active Problem List   Diagnosis Date Noted  . Anxiety and depression 05/02/2017  . Liver lesion 05/02/2017  . Alcohol abuse 05/02/2017  . Ascites due to alcoholic cirrhosis ([place])   . Gastrointestinal hemorrhage   . Tachycardia 04/16/2017  . Hyperlipidemia 06/09/2016  . Anemia 11/27/2014  . Insomnia 11/26/2014  . Liver cirrhosis ([place]) 09/21/2014  . Tobacco abuse 03/15/2011     Current Outpatient Medications on File Prior to Visit  Medication Sig Dispense Refill  . ferrous sulfate (FERROUSUL) 325 (65 FE) MG tablet Take 1 tablet (325 mg total) by mouth daily with breakfast. 60 tablet 1  . furosemide (LASIX) 40 MG tablet Take 1 tablet (40 mg total) by mouth daily. 30 tablet 6  . lactulose (CHRONULAC) 10 GM/15ML solution Take 15 mLs (10 g total) by mouth daily. 240 mL 6  . spironolactone (ALDACTONE) 100 MG tablet Take 1 tablet (100 mg total) by mouth daily. 30 tablet 5  . zolpidem (AMBIEN CR) 6.25 MG CR tablet TAKE 1 TABLET BY MOUTH AT BEDTIME AS NEEDED FOR SLEEP 30 tablet 1   No current facility-administered medications on file prior to  visit.     No Known Allergies  Social History   Socioeconomic History  . Marital status: Legally Separated    Spouse name: Not on file  . Number of children: Not on file  . Years of education: Not on file  . Highest education level: Not on file  Occupational History  . Not on file  Social Needs  . Financial resource strain: Not on file  . Food insecurity:    Worry: Not on file    Inability: Not on file  . Transportation needs:    Medical: Not on file    Non-medical: Not on file  Tobacco Use  . Smoking status: Current Every Day Smoker    Packs/day: 0.50    Types: Cigarettes  . Smokeless tobacco: Never Used  Substance and Sexual Activity  . Alcohol use: Yes    Comment: pt states last drink was 3 days ago  . Drug use: No  . Sexual activity: Never  Lifestyle  . Physical activity:    Days per week: Not on file    Minutes per session: Not on file  . Stress: Not on file  Relationships  . Social connections:    Talks on phone: Not on file    Gets together: Not on file    Attends religious service: Not on file    Active member  of club or organization: Not on file    Attends meetings of clubs or organizations: Not on file    Relationship status: Not on file  . Intimate partner violence:    Fear of current or ex partner: Not on file    Emotionally abused: Not on file    Physically abused: Not on file    Forced sexual activity: Not on file  Other Topics Concern  . Not on file  Social History Narrative  . Not on file    Family History  Problem Relation Age of Onset  . Coronary artery disease Unknown   . Diabetes type II Unknown     Past Surgical History:  Procedure Laterality Date  . ESOPHAGOGASTRODUODENOSCOPY N/A 04/19/2017   Procedure: ESOPHAGOGASTRODUODENOSCOPY (EGD);  Surgeon: Doran Stabler, MD;  Location: [facility];  Service: Gastroenterology;  Laterality: N/A;  . IR PARACENTESIS  04/17/2017  . NO PAST SURGERIES      ROS: Review of Systems MSK:  Complains of some soreness and stiffness in the DIP joints first thing in the mornings that lasts less than 30 minutes.  No swelling of the joints.  PHYSICAL EXAM: BP 105/73   Pulse (!) 107   Temp 98.4 F (36.9 C) (Oral)   Resp 16   Wt 142 lb 9.6 oz (64.7 kg)   SpO2 97%   BMI 23.02 kg/m   Wt Readings from Last 3 Encounters:  09/14/17 142 lb 9.6 oz (64.7 kg)  06/22/17 137 lb 9.6 oz (62.4 kg)  04/30/17 129 lb 3.2 oz (58.6 kg)    Physical Exam  General appearance - alert, well appearing, and in no distress Mental status - normal mood, behavior, speech, dress, motor activity, and thought processes Chest - clear to auscultation, no wheezes, rales or rhonchi, symmetric air entry Heart - normal rate, regular rhythm, normal S1, S2, no murmurs, rubs, clicks or gallops Abdomen - soft, nontender, nondistended, no masses or organomegaly Extremities - peripheral pulses normal, no pedal edema, no clubbing or cyanosis MSK: Hands -no joint enlargement or deformities of the MCP PIP and DIP joints.  He has good range of motion.  Lab Results  Component Value Date   WBC 14.6 (H) 04/30/2017   HGB 10.9 (L) 04/30/2017   HCT 33.8 (L) 04/30/2017   MCV 92 04/30/2017   PLT 484 (H) 04/30/2017    ASSESSMENT AND PLAN: 1. Chronic insomnia Continue to stress sleep hygiene. Advised against breaking Ambien CR and half as controlled release tablets and not meant to be cut in half.  Continue current dose of Ambien.  Will add low-dose of trazodone. - traZODone (DESYREL) 50 MG tablet; Take 0.5 tablets (25 mg total) by mouth at bedtime as needed for sleep.  Dispense: 15 tablet; Refill: 3  2. Alcoholic cirrhosis of liver without ascites ([facility]) Patient in remission.  Commended him on this and encouraged him to continue to abstain. - Ambulatory referral to Gastroenterology  3. Anemia, unspecified type He has stopped taking iron consistently.  Will check CBC today. - CBC  4. Osteoarthritis of finger,  unspecified laterality Symptoms suggest early OA.  Patient was given the opportunity to ask questions.  Patient verbalized understanding of the plan and was able to repeat key elements of the plan.   Orders Placed This Encounter  Procedures  . CBC  . Ambulatory referral to Gastroenterology     Requested Prescriptions   Signed Prescriptions Disp Refills  . traZODone (DESYREL) 50 MG tablet 15 tablet 3  Sig: Take 0.5 tablets (25 mg total) by mouth at bedtime as needed for sleep.    Return in about 4 months (around 01/15/2018).  [name], MD, FACP

## 2017-09-15 ENCOUNTER — Other Ambulatory Visit: Payer: Self-pay | Admitting: Internal Medicine

## 2017-09-15 LAB — CBC
HEMOGLOBIN: 14.2 g/dL (ref 13.0–17.7)
Hematocrit: 43.1 % (ref 37.5–51.0)
MCH: 27 pg (ref 26.6–33.0)
MCHC: 32.9 g/dL (ref 31.5–35.7)
MCV: 82 fL (ref 79–97)
Platelets: 317 10*3/uL (ref 150–450)
RBC: 5.25 x10E6/uL (ref 4.14–5.80)
RDW: 14.7 % (ref 12.3–15.4)
WBC: 12.2 10*3/uL — ABNORMAL HIGH (ref 3.4–10.8)

## 2017-09-17 ENCOUNTER — Telehealth: Payer: Self-pay

## 2017-09-17 NOTE — Telephone Encounter (Signed)
Contacted pt to go over lab results pt is aware and doesn't have any questions or concerns 

## 2017-09-21 ENCOUNTER — Ambulatory Visit: Payer: Self-pay | Admitting: Internal Medicine

## 2017-09-28 MED FILL — ZOLPIDEM TART ER 6.25 MG TA: 6.25 | 30 days supply | Qty: 30 | Fill #1

## 2017-10-09 ENCOUNTER — Ambulatory Visit: Payer: Self-pay | Attending: Internal Medicine | Admitting: Internal Medicine

## 2017-10-09 ENCOUNTER — Encounter: Payer: Self-pay | Admitting: Internal Medicine

## 2017-10-09 VITALS — BP 108/71 | HR 78 | Temp 98.5°F | Resp 16 | Wt 145.4 lb

## 2017-10-09 DIAGNOSIS — S025XXA Fracture of tooth (traumatic), initial encounter for closed fracture: Secondary | ICD-10-CM | POA: Insufficient documentation

## 2017-10-09 DIAGNOSIS — E785 Hyperlipidemia, unspecified: Secondary | ICD-10-CM

## 2017-10-09 DIAGNOSIS — H547 Unspecified visual loss: Secondary | ICD-10-CM

## 2017-10-09 DIAGNOSIS — F5104 Psychophysiologic insomnia: Secondary | ICD-10-CM

## 2017-10-09 DIAGNOSIS — K7030 Alcoholic cirrhosis of liver without ascites: Secondary | ICD-10-CM

## 2017-10-09 DIAGNOSIS — D229 Melanocytic nevi, unspecified: Secondary | ICD-10-CM

## 2017-10-09 DIAGNOSIS — X58XXXA Exposure to other specified factors, initial encounter: Secondary | ICD-10-CM | POA: Insufficient documentation

## 2017-10-09 DIAGNOSIS — K029 Dental caries, unspecified: Secondary | ICD-10-CM

## 2017-10-09 DIAGNOSIS — F419 Anxiety disorder, unspecified: Secondary | ICD-10-CM | POA: Insufficient documentation

## 2017-10-09 DIAGNOSIS — F17210 Nicotine dependence, cigarettes, uncomplicated: Secondary | ICD-10-CM | POA: Insufficient documentation

## 2017-10-09 DIAGNOSIS — G4700 Insomnia, unspecified: Secondary | ICD-10-CM

## 2017-10-09 DIAGNOSIS — F329 Major depressive disorder, single episode, unspecified: Secondary | ICD-10-CM | POA: Insufficient documentation

## 2017-10-09 DIAGNOSIS — Z79899 Other long term (current) drug therapy: Secondary | ICD-10-CM | POA: Insufficient documentation

## 2017-10-09 DIAGNOSIS — D367 Benign neoplasm of other specified sites: Secondary | ICD-10-CM | POA: Insufficient documentation

## 2017-10-09 MED ORDER — ZOLPIDEM TARTRATE ER 6.25 MG PO TBCR: 6.2500 mg | EXTENDED_RELEASE_TABLET | Freq: Every evening | ORAL | 2 refills | Status: DC | PRN

## 2017-10-09 MED ORDER — LACTULOSE 10 GM/15ML PO SOLN: 10.0000 g | Freq: Every day | ORAL | 6 refills | Status: DC

## 2017-10-09 NOTE — Progress Notes (Signed)
Patient ID: [name], male    DOB: 1965/08/29  MRN: [id number]  CC: Referral (dentist/dermatology)   Subjective: [name] is a 52 y.o. male who presents for UC visits His concerns today include:  Pt with hx of ETOH cirrhosis, tobacco dep, insomnia, anx/dep and hyperlipidemia  Wants dental referral.  Has a broken tooth in upper jaw.  No jaw swelling.  Also request dermatology appointment for small growth that he is noticed on the nasal ridge medial to the right eye.  It is been there for several weeks and appears to be increasing in size.  Would like referral to have patient check.  He wears glasses for distance and has not had an eye exam in quite some time.  He feels that his vision is declining.  Requests refill on Ambien.  Would like to have his cholesterol rechecked.  Last checked in April of last year revealed hyperlipidemia. CBC done on last visit revealed that his blood count had returned to normal.  He was advised to stop iron which he has done.    Patient Active Problem List   Diagnosis Date Noted  . Anxiety and depression 05/02/2017  . Liver lesion 05/02/2017  . Alcohol abuse 05/02/2017  . Ascites due to alcoholic cirrhosis ([place])   . Gastrointestinal hemorrhage   . Tachycardia 04/16/2017  . Hyperlipidemia 06/09/2016  . Anemia 11/27/2014  . Insomnia 11/26/2014  . Liver cirrhosis ([place]) 09/21/2014  . Tobacco abuse 03/15/2011     Current Outpatient Medications on File Prior to Visit  Medication Sig Dispense Refill  . spironolactone (ALDACTONE) 100 MG tablet Take 1 tablet (100 mg total) by mouth daily. 30 tablet 5  . traZODone (DESYREL) 50 MG tablet Take 0.5 tablets (25 mg total) by mouth at bedtime as needed for sleep. 15 tablet 3   No current facility-administered medications on file prior to visit.     No Known Allergies  Social History   Socioeconomic History  . Marital status: Legally Separated    Spouse name: Not on file  .  Number of children: Not on file  . Years of education: Not on file  . Highest education level: Not on file  Occupational History  . Not on file  Social Needs  . Financial resource strain: Not on file  . Food insecurity:    Worry: Not on file    Inability: Not on file  . Transportation needs:    Medical: Not on file    Non-medical: Not on file  Tobacco Use  . Smoking status: Current Every Day Smoker    Packs/day: 0.50    Types: Cigarettes  . Smokeless tobacco: Never Used  Substance and Sexual Activity  . Alcohol use: Yes    Comment: pt states last drink was 3 days ago  . Drug use: No  . Sexual activity: Never  Lifestyle  . Physical activity:    Days per week: Not on file    Minutes per session: Not on file  . Stress: Not on file  Relationships  . Social connections:    Talks on phone: Not on file    Gets together: Not on file    Attends religious service: Not on file    Active member of club or organization: Not on file    Attends meetings of clubs or organizations: Not on file    Relationship status: Not on file  . Intimate partner violence:    Fear of current or ex partner: Not  on file    Emotionally abused: Not on file    Physically abused: Not on file    Forced sexual activity: Not on file  Other Topics Concern  . Not on file  Social History Narrative  . Not on file    Family History  Problem Relation Age of Onset  . Coronary artery disease Unknown   . Diabetes type II Unknown     Past Surgical History:  Procedure Laterality Date  . ESOPHAGOGASTRODUODENOSCOPY N/A 04/19/2017   Procedure: ESOPHAGOGASTRODUODENOSCOPY (EGD);  Surgeon: Doran Stabler, MD;  Location: [facility];  Service: Gastroenterology;  Laterality: N/A;  . IR PARACENTESIS  04/17/2017  . NO PAST SURGERIES      ROS: Review of Systems Negative except as stated above. PHYSICAL EXAM: BP 108/71   Pulse 78   Temp 98.5 F (36.9 C) (Oral)   Resp 16   Wt 145 lb 6.4 oz (66 kg)   SpO2  99%   BMI 23.47 kg/m   Physical Exam General appearance - alert, well appearing, and in no distress Mental status - alert, oriented to person, place, and time Mouth -insert in the right upper jaw right is broken off in the gum.  He has significant plaque buildup on teeth  abdomen - soft, nontender, nondistended, no masses or organomegaly Extremities -no lower extremity edema Skin -patient has a small mole on the right side of nasal ridge   ASSESSMENT AND PLAN: 1. Tooth decay Patient also given printed information on low cost dental practices in the area. - Ambulatory referral to Dentistry  2. Atypical mole - Ambulatory referral to Dermatology  3. Poor vision Orange card and cone discount do not cover for eye exam.  Advised him of low cost places in the area where he can go to have eye exam  4. Alcoholic cirrhosis of liver without ascites ([place]) -Patient no longer has ascites and is clinically stable.  We will recheck chemistry today.  Advised to stop furosemide.  Continue Spironolactone.  Limit salt in the foods. - lactulose (CHRONULAC) 10 GM/15ML solution; Take 15 mLs (10 g total) by mouth daily.  Dispense: 240 mL; Refill: 6 - Comprehensive metabolic panel; Future  5. Chronic insomnia Refill given on Ambien - zolpidem (AMBIEN CR) 6.25 MG CR tablet; Take 1 tablet (6.25 mg total) by mouth at bedtime as needed. First refill on 10/29/2017.  Each to last one month  Dispense: 30 tablet; Refill: 2  6. Hyperlipidemia, unspecified hyperlipidemia type - Lipid panel; Future  Patient was given the opportunity to ask questions.  Patient verbalized understanding of the plan and was able to repeat key elements of the plan.   Orders Placed This Encounter  Procedures  . Comprehensive metabolic panel  . Lipid panel  . Ambulatory referral to Dentistry  . Ambulatory referral to Dermatology     Requested Prescriptions   Signed Prescriptions Disp Refills  . lactulose (CHRONULAC) 10 GM/15ML  solution 240 mL 6    Sig: Take 15 mLs (10 g total) by mouth daily.  Marland Kitchen zolpidem (AMBIEN CR) 6.25 MG CR tablet 30 tablet 2    Sig: Take 1 tablet (6.25 mg total) by mouth at bedtime as needed. First refill on 10/29/2017.  Each to last one month    No follow-ups on file.  [name], MD, FACP

## 2017-10-11 ENCOUNTER — Other Ambulatory Visit: Payer: Self-pay

## 2017-10-19 ENCOUNTER — Ambulatory Visit: Payer: Self-pay | Attending: Family Medicine

## 2017-10-19 DIAGNOSIS — E785 Hyperlipidemia, unspecified: Secondary | ICD-10-CM | POA: Insufficient documentation

## 2017-10-19 DIAGNOSIS — K7030 Alcoholic cirrhosis of liver without ascites: Secondary | ICD-10-CM | POA: Insufficient documentation

## 2017-10-19 NOTE — Progress Notes (Signed)
Patient here for lab visit only 

## 2017-10-20 LAB — COMPREHENSIVE METABOLIC PANEL
A/G RATIO: 1.6 (ref 1.2–2.2)
ALK PHOS: 69 IU/L (ref 39–117)
ALT: 8 IU/L (ref 0–44)
AST: 11 IU/L (ref 0–40)
Albumin: 4.4 g/dL (ref 3.5–5.5)
BUN/Creatinine Ratio: 12 (ref 9–20)
BUN: 10 mg/dL (ref 6–24)
Bilirubin Total: 0.5 mg/dL (ref 0.0–1.2)
CALCIUM: 9.3 mg/dL (ref 8.7–10.2)
CHLORIDE: 98 mmol/L (ref 96–106)
CO2: 24 mmol/L (ref 20–29)
Creatinine, Ser: 0.83 mg/dL (ref 0.76–1.27)
GFR calc Af Amer: 116 mL/min/{1.73_m2} (ref >59)
GFR, EST NON AFRICAN AMERICAN: 100 mL/min/{1.73_m2} (ref >59)
GLOBULIN, TOTAL: 2.8 g/dL (ref 1.5–4.5)
Glucose: 100 mg/dL — ABNORMAL HIGH (ref 65–99)
POTASSIUM: 4.1 mmol/L (ref 3.5–5.2)
SODIUM: 139 mmol/L (ref 134–144)
Total Protein: 7.2 g/dL (ref 6.0–8.5)

## 2017-10-20 LAB — LIPID PANEL
Chol/HDL Ratio: 9.4 ratio — ABNORMAL HIGH (ref 0.0–5.0)
Cholesterol, Total: 272 mg/dL — ABNORMAL HIGH (ref 100–199)
HDL: 29 mg/dL — ABNORMAL LOW (ref >39)
LDL CALC: 201 mg/dL — AB (ref 0–99)
TRIGLYCERIDES: 210 mg/dL — AB (ref 0–149)
VLDL CHOLESTEROL CAL: 42 mg/dL — AB (ref 5–40)

## 2017-10-21 ENCOUNTER — Other Ambulatory Visit: Payer: Self-pay | Admitting: Internal Medicine

## 2017-10-21 DIAGNOSIS — E785 Hyperlipidemia, unspecified: Secondary | ICD-10-CM

## 2017-10-21 MED ORDER — ATORVASTATIN CALCIUM 10 MG PO TABS: 10.0000 mg | ORAL_TABLET | Freq: Every day | ORAL | 3 refills | Status: DC

## 2017-10-22 ENCOUNTER — Telehealth: Payer: Self-pay

## 2017-10-22 MED FILL — ATORVASTATIN 10 MG TABLET: 10 | 30 days supply | Qty: 30 | Fill #0

## 2017-10-22 NOTE — Telephone Encounter (Signed)
Contacted pt to go over lab results pt didn't answer lvm asking pt to give me a call at his earliest convenience   If pt calls back please give results: his liver function has normalized. Cholesterol level is elevated - bad cholesterol is 201 with goal being less than 100. I recommend started a cholesterol lowering medication called Atorvastatin. Rx has been sent to his pharmacy. We will need to recheck his liver function after being on this med for 4-6 weeks. Please come as lab only visit at that time.

## 2017-10-24 ENCOUNTER — Telehealth: Payer: Self-pay | Admitting: Internal Medicine

## 2017-10-24 NOTE — Telephone Encounter (Signed)
Returned pt call and went over lab results with pt. Pt doesn't have any questions or concerns

## 2017-10-24 NOTE — Telephone Encounter (Signed)
Patient has some question and would like you to call him back

## 2017-10-30 MED FILL — ZOLPIDEM TART ER 6.25 MG TA: 6.25 | 30 days supply | Qty: 30 | Fill #0

## 2017-11-30 ENCOUNTER — Other Ambulatory Visit: Payer: Self-pay | Admitting: Internal Medicine

## 2017-11-30 DIAGNOSIS — F5104 Psychophysiologic insomnia: Secondary | ICD-10-CM

## 2018-01-17 ENCOUNTER — Ambulatory Visit: Payer: Self-pay | Attending: Internal Medicine | Admitting: Physician Assistant

## 2018-01-17 ENCOUNTER — Ambulatory Visit: Payer: Self-pay | Admitting: Internal Medicine

## 2018-01-17 VITALS — BP 108/66 | HR 90 | Temp 99.3°F | Resp 18 | Ht 65.0 in | Wt 156.0 lb

## 2018-01-17 DIAGNOSIS — G4700 Insomnia, unspecified: Secondary | ICD-10-CM | POA: Insufficient documentation

## 2018-01-17 DIAGNOSIS — E785 Hyperlipidemia, unspecified: Secondary | ICD-10-CM | POA: Insufficient documentation

## 2018-01-17 DIAGNOSIS — Z79899 Other long term (current) drug therapy: Secondary | ICD-10-CM | POA: Insufficient documentation

## 2018-01-17 DIAGNOSIS — F5104 Psychophysiologic insomnia: Secondary | ICD-10-CM

## 2018-01-17 MED ORDER — MIRTAZAPINE 15 MG PO TABS: 15.0000 mg | ORAL_TABLET | Freq: Every day | ORAL | 2 refills | Status: DC

## 2018-01-17 MED ORDER — ZOLPIDEM TARTRATE ER 6.25 MG PO TBCR: 6.2500 mg | EXTENDED_RELEASE_TABLET | Freq: Every evening | ORAL | 2 refills | Status: DC | PRN

## 2018-01-17 MED ORDER — ATORVASTATIN CALCIUM 10 MG PO TABS: 10.0000 mg | ORAL_TABLET | Freq: Every day | ORAL | 3 refills | Status: DC

## 2018-01-17 NOTE — Progress Notes (Signed)
Patient ID: [name], male   DOB: [birth year], 52 y.o.   MRN: [id number]   [name], is a 52 y.o. male  QJF:[id number]  [id number]:[id number]  DOB - 1965/12/22  Subjective:  Chief Complaint and HPI: [name] is a 52 y.o. male here today bc not sleeping well.  Dr Adrian Blackwater used to prescribe him 10mg  Lorrin Mais and remeron.  He is currently on 6.25mg  ambien CR and says it doesn't help.  He would like to try adding remeron again.  trazadone never helped much but never took more than 50mg .  He used to drink alcohol until he went to sleep but now has been sober X9 months!  He has trouble going to sleep and staying asleep.    Not fasting so he doesn't want to do any lab work today.  He is due for LFT but says he would rather have done when fasting and also check cholesterol.  No body aches or abdominal pain since starting atorvastatin.    ROS:   Constitutional:  No f/c, No night sweats, No unexplained weight loss. EENT:  No vision changes, No blurry vision, No hearing changes. No mouth, throat, or ear problems.  Respiratory: No cough, No SOB Cardiac: No CP, no palpitations GI:  No abd pain, No N/V/D. GU: No Urinary s/sx Musculoskeletal: No joint pain Neuro: No headache, no dizziness, no motor weakness.  Skin: No rash Endocrine:  No polydipsia. No polyuria.  Psych: Denies SI/HI  No problems updated.  ALLERGIES: No Known Allergies  PAST MEDICAL HISTORY: Past Medical History:  Diagnosis Date  . Alcoholism ([place])   . Cirrhosis ([place])   . Hyperlipemia     MEDICATIONS AT HOME: Prior to Admission medications   Medication Sig Start Date End Date Taking? Authorizing Provider  atorvastatin (LIPITOR) 10 MG tablet Take 1 tablet (10 mg total) by mouth daily. 01/17/18  Yes Rotha Cassels M, PA-C  zolpidem (AMBIEN CR) 6.25 MG CR tablet Take 1 tablet (6.25 mg total) by mouth at bedtime as needed. First refill on 10/29/2017.  Each to last one month 01/17/18  Yes Brodey Bonn, Dionne Bucy,  PA-C  lactulose (CHRONULAC) 10 GM/15ML solution Take 15 mLs (10 g total) by mouth daily. Patient not taking: Reported on 01/17/2018 10/09/17   [name], MD  mirtazapine (REMERON) 15 MG tablet Take 1 tablet (15 mg total) by mouth at bedtime. 01/17/18   Argentina Donovan, PA-C     Objective:  EXAM:   Vitals:   01/17/18 1431  BP: 108/66  Pulse: 90  Resp: 18  Temp: 99.3 F (37.4 C)  TempSrc: Oral  SpO2: 98%  Weight: 156 lb (70.8 kg)  Height: 5\' 5"  (1.651 m)    General appearance : A&OX3. NAD. Non-toxic-appearing HEENT: Atraumatic and Normocephalic.  PERRLA. EOM intact.  Neck: supple, no JVD. No cervical lymphadenopathy. No thyromegaly Chest/Lungs:  Breathing-non-labored, Good air entry bilaterally, breath sounds normal without rales, rhonchi, or wheezing  CVS: S1 S2 regular, no murmurs, gallops, rubs  Neurology:  CN II-XII grossly intact, Non focal.   Psych:  TP linear. J/I WNL. Normal speech. Appropriate eye contact and affect.  Skin:  No Rash  Data Review Lab Results  Component Value Date   HGBA1C 5.1 10/23/2014     Assessment & Plan   1. Chronic insomnia Not controlled-can try adding remeron since that worked in the past with Lorrin Mais.   - mirtazapine (REMERON) 15 MG tablet; Take 1 tablet (15 mg total) by mouth at bedtime.  Dispense: 30 tablet; Refill: 2 - zolpidem (AMBIEN CR) 6.25 MG CR tablet; Take 1 tablet (6.25 mg total) by mouth at bedtime as needed. First refill on 10/29/2017.  Each to last one month  Dispense: 30 tablet; Refill: 2 Congratulated on sobriety and keep up the good work!  2. Hyperlipidemia, unspecified hyperlipidemia type - atorvastatin (LIPITOR) 10 MG tablet; Take 1 tablet (10 mg total) by mouth daily.  Dispense: 30 tablet; Refill: 3 - Hepatic Function Panel; Future - Lipid panel; Future  3. High risk medication use - Hepatic Function Panel; Future   Patient have been counseled extensively about nutrition and exercise  Return in about 2  months (around 03/19/2018) for Dr Wynetta Emery for insomnia and high cholesterol.  The patient was given clear instructions to go to ER or return to medical center if symptoms don't improve, worsen or new problems develop. The patient verbalized understanding. The patient was told to call to get lab results if they haven't heard anything in the next week.     Freeman Caldron, PA-C [facility] and [place], [place]   01/17/2018, 2:56 PM

## 2018-02-08 ENCOUNTER — Other Ambulatory Visit: Payer: Self-pay | Admitting: Physician Assistant

## 2018-02-08 DIAGNOSIS — F5104 Psychophysiologic insomnia: Secondary | ICD-10-CM

## 2018-03-01 ENCOUNTER — Ambulatory Visit: Payer: Self-pay | Attending: Family Medicine

## 2018-03-01 ENCOUNTER — Telehealth: Payer: Self-pay | Admitting: Internal Medicine

## 2018-03-01 NOTE — Telephone Encounter (Signed)
Patient called because he had questions regarding his missing documentation. Please follow up.

## 2018-03-07 ENCOUNTER — Telehealth: Payer: Self-pay | Admitting: Internal Medicine

## 2018-03-07 NOTE — Telephone Encounter (Signed)
Patient wanted to speak with you regarding his pending documentation. Please follow up.

## 2018-03-07 NOTE — Telephone Encounter (Signed)
I spoke with Pt, he will stop by todat at 4:30

## 2018-03-08 ENCOUNTER — Other Ambulatory Visit: Payer: Self-pay | Admitting: Internal Medicine

## 2018-03-08 DIAGNOSIS — F5104 Psychophysiologic insomnia: Secondary | ICD-10-CM

## 2018-03-19 ENCOUNTER — Ambulatory Visit: Payer: Self-pay | Attending: Internal Medicine | Admitting: Internal Medicine

## 2018-03-19 ENCOUNTER — Encounter: Payer: Self-pay | Admitting: Internal Medicine

## 2018-03-19 VITALS — BP 120/81 | HR 91 | Temp 98.2°F | Resp 16 | Ht 66.0 in | Wt 153.4 lb

## 2018-03-19 DIAGNOSIS — F5104 Psychophysiologic insomnia: Secondary | ICD-10-CM

## 2018-03-19 DIAGNOSIS — Z79899 Other long term (current) drug therapy: Secondary | ICD-10-CM | POA: Insufficient documentation

## 2018-03-19 DIAGNOSIS — Z833 Family history of diabetes mellitus: Secondary | ICD-10-CM | POA: Insufficient documentation

## 2018-03-19 DIAGNOSIS — Z23 Encounter for immunization: Secondary | ICD-10-CM

## 2018-03-19 DIAGNOSIS — F17200 Nicotine dependence, unspecified, uncomplicated: Secondary | ICD-10-CM

## 2018-03-19 DIAGNOSIS — Z2821 Immunization not carried out because of patient refusal: Secondary | ICD-10-CM

## 2018-03-19 DIAGNOSIS — F17210 Nicotine dependence, cigarettes, uncomplicated: Secondary | ICD-10-CM | POA: Insufficient documentation

## 2018-03-19 DIAGNOSIS — E785 Hyperlipidemia, unspecified: Secondary | ICD-10-CM

## 2018-03-19 MED ORDER — TRAZODONE HCL 50 MG PO TABS: 50.0000 mg | ORAL_TABLET | Freq: Every day | ORAL | 3 refills | Status: DC

## 2018-03-19 MED ORDER — TETANUS-DIPHTH-ACELL PERTUSSIS 5-2.5-18.5 LF-MCG/0.5 IM SUSP: 0.5000 mL | INTRAMUSCULAR | 0 refills | Status: DC

## 2018-03-19 NOTE — Patient Instructions (Signed)
Td Vaccine (Tetanus and Diphtheria): What You Need to Know 1. Why get vaccinated? Tetanus  and diphtheria are very serious diseases. They are rare in the [place] today, but people who do become infected often have severe complications. Td vaccine is used to protect adolescents and adults from both of these diseases. Both tetanus and diphtheria are infections caused by bacteria. Diphtheria spreads from person to person through coughing or sneezing. Tetanus-causing bacteria enter the body through cuts, scratches, or wounds. TETANUS (Lockjaw) causes painful muscle tightening and stiffness, usually all over the body.  It can lead to tightening of muscles in the head and neck so you can't open your mouth, swallow, or sometimes even breathe. Tetanus kills about 1 out of every 10 people who are infected even after receiving the best medical care. DIPHTHERIA can cause a thick coating to form in the back of the throat.  It can lead to breathing problems, paralysis, heart failure, and death. Before vaccines, as many as 200,000 cases of diphtheria and hundreds of cases of tetanus were reported in the [place] each year. Since vaccination began, reports of cases for both diseases have dropped by about 99%. 2. Td vaccine Td vaccine can protect adolescents and adults from tetanus and diphtheria. Td is usually given as a booster dose every 10 years but it can also be given earlier after a severe and dirty wound or burn. Another vaccine, called Tdap, which protects against pertussis in addition to tetanus and diphtheria, is sometimes recommended instead of Td vaccine. Your doctor or the person giving you the vaccine can give you more information. Td may safely be given at the same time as other vaccines. 3. Some people should not get this vaccine  A person who has ever had a life-threatening allergic reaction after a previous dose of any tetanus or diphtheria containing vaccine, OR has a severe allergy  to any part of this vaccine, should not get Td vaccine. Tell the person giving the vaccine about any severe allergies.  Talk to your doctor if you: ? had severe pain or swelling after any vaccine containing diphtheria or tetanus, ? ever had a condition called Guillain Barr Syndrome (GBS), ? aren't feeling well on the day the shot is scheduled. 4. Risks of a vaccine reaction With any medicine, including vaccines, there is a chance of side effects. These are usually mild and go away on their own. Serious reactions are also possible but are rare. Most people who get Td vaccine do not have any problems with it. Mild Problems following Td vaccine: (Did not interfere with activities)  Pain where the shot was given (about 8 people in 10)  Redness or swelling where the shot was given (about 1 person in 4)  Mild fever (rare)  Headache (about 1 person in 4)  Tiredness (about 1 person in 4) Moderate Problems following Td vaccine: (Interfered with activities, but did not require medical attention)  Fever over 102F (rare) Severe Problems following Td vaccine: (Unable to perform usual activities; required medical attention)  Swelling, severe pain, bleeding and/or redness in the arm where the shot was given (rare). Problems that could happen after any vaccine:  People sometimes faint after a medical procedure, including vaccination. Sitting or lying down for about 15 minutes can help prevent fainting, and injuries caused by a fall. Tell your doctor if you feel dizzy, or have vision changes or ringing in the ears.  Some people get severe pain in the shoulder and have   difficulty moving the arm where a shot was given. This happens very rarely.  Any medication can cause a severe allergic reaction. Such reactions from a vaccine are very rare, estimated at fewer than 1 in a million doses, and would happen within a few minutes to a few hours after the vaccination. As with any medicine, there is a  very remote chance of a vaccine causing a serious injury or death. The safety of vaccines is always being monitored. For more information, visit: www.cdc.gov/vaccinesafety/ 5. What if there is a serious reaction? What should I look for?  Look for anything that concerns you, such as signs of a severe allergic reaction, very high fever, or unusual behavior. Signs of a severe allergic reaction can include hives, swelling of the face and throat, difficulty breathing, a fast heartbeat, dizziness, and weakness. These would usually start a few minutes to a few hours after the vaccination. What should I do?  If you think it is a severe allergic reaction or other emergency that can't wait, call 9-1-1 or get the person to the nearest hospital. Otherwise, call your doctor.  Afterward, the reaction should be reported to the Vaccine Adverse Event Reporting System (VAERS). Your doctor might file this report, or you can do it yourself through the VAERS web site at www.vaers.hhs.gov, or by calling 1-800-822-7967. VAERS does not give medical advice. 6. The National Vaccine Injury Compensation Program The National Vaccine Injury Compensation Program (VICP) is a federal program that was created to compensate people who may have been injured by certain vaccines. Persons who believe they may have been injured by a vaccine can learn about the program and about filing a claim by calling 1-800-338-2382 or visiting the VICP website at www.hrsa.gov/vaccinecompensation. There is a time limit to file a claim for compensation. 7. How can I learn more?  Ask your doctor. He or she can give you the vaccine package insert or suggest other sources of information.  Call your local or state health department.  Contact the Centers for Disease Control and Prevention (CDC): ? Call 1-800-232-4636 (1-800-CDC-INFO) ? Visit CDC's website at www.cdc.gov/vaccines Vaccine Information Statement Td Vaccine (06/08/15) This information is  not intended to replace advice given to you by your health care provider. Make sure you discuss any questions you have with your health care provider. Document Released: 12/11/2005 Document Revised: 10/01/2017 Document Reviewed: 10/01/2017 Elsevier Interactive Patient Education  2019 Elsevier Inc.  

## 2018-03-19 NOTE — Progress Notes (Addendum)
Patient ID: [name], male    DOB: 1965/12/05  MRN: [id number]  CC: Follow-up (2 month )   Subjective: [name] is a 53 y.o. male who presents for chronic ds management His concerns today include:  Pt with hx of ETOH cirrhosis, tobacco dep, insomnia, anx/depand hyperlipidemia  ETOH: Patient has been clean of alcohol for almost 1 year.  He has stopped Spironolactone and lactulose.  Reports that overall he feels well.  He exercises daily doing push-ups and walking.    HL:  Tolerating Lipitor.  Due for recheck and LFTs.  Chronic insomnia: Reports that this is still his major issue.  Seen by physician assistant on last visit.  He had requested addition of Remeron to Ambien to help him sleep better.  He stopped taking because it upset stomach.  Goes to bed around 9 p.m but does not fall asleep until 11 or 12 p.m.  Then sleeps for 4 hrs. does not take any caffeinated beverages at bedtime.  He turns off all lights and sounds once he gets in bed.  He still taking Ambien CR.  He finds the Ambien CR to be expensive.  He is wondering about whether we can add back trazodone to the Ambien to help with better sleep  Backo dependence: Reports that he is now down to 1/2 pk/day from  1.5 pk/day.  He has set a quit date for the 20th of next month.   Patient Active Problem List   Diagnosis Date Noted  . Anxiety and depression 05/02/2017  . Liver lesion 05/02/2017  . H/O alcohol abuse 05/02/2017  . Ascites due to alcoholic cirrhosis ([place])   . Gastrointestinal hemorrhage   . Tachycardia 04/16/2017  . Hyperlipidemia 06/09/2016  . Anemia 11/27/2014  . Insomnia 11/26/2014  . Liver cirrhosis ([place]) 09/21/2014  . Tobacco abuse 03/15/2011     Current Outpatient Medications on File Prior to Visit  Medication Sig Dispense Refill  . atorvastatin (LIPITOR) 10 MG tablet Take 1 tablet (10 mg total) by mouth daily. 30 tablet 3  . lactulose (CHRONULAC) 10 GM/15ML solution Take 15 mLs (10 g  total) by mouth daily. (Patient not taking: Reported on 01/17/2018) 240 mL 6  . zolpidem (AMBIEN CR) 6.25 MG CR tablet Take 1 tablet (6.25 mg total) by mouth at bedtime as needed. First refill on 10/29/2017.  Each to last one month 30 tablet 2   No current facility-administered medications on file prior to visit.     No Known Allergies  Social History   Socioeconomic History  . Marital status: Legally Separated    Spouse name: Not on file  . Number of children: Not on file  . Years of education: Not on file  . Highest education level: Not on file  Occupational History  . Not on file  Social Needs  . Financial resource strain: Not on file  . Food insecurity:    Worry: Not on file    Inability: Not on file  . Transportation needs:    Medical: Not on file    Non-medical: Not on file  Tobacco Use  . Smoking status: Current Every Day Smoker    Packs/day: 0.50    Types: Cigarettes  . Smokeless tobacco: Never Used  Substance and Sexual Activity  . Alcohol use: Yes    Comment: pt states last drink was 3 days ago  . Drug use: No  . Sexual activity: Never  Lifestyle  . Physical activity:    Days  per week: Not on file    Minutes per session: Not on file  . Stress: Not on file  Relationships  . Social connections:    Talks on phone: Not on file    Gets together: Not on file    Attends religious service: Not on file    Active member of club or organization: Not on file    Attends meetings of clubs or organizations: Not on file    Relationship status: Not on file  . Intimate partner violence:    Fear of current or ex partner: Not on file    Emotionally abused: Not on file    Physically abused: Not on file    Forced sexual activity: Not on file  Other Topics Concern  . Not on file  Social History Narrative  . Not on file    Family History  Problem Relation Age of Onset  . Coronary artery disease Unknown   . Diabetes type II Unknown     Past Surgical History:    Procedure Laterality Date  . ESOPHAGOGASTRODUODENOSCOPY N/A 04/19/2017   Procedure: ESOPHAGOGASTRODUODENOSCOPY (EGD);  Surgeon: Doran Stabler, MD;  Location: [facility];  Service: Gastroenterology;  Laterality: N/A;  . IR PARACENTESIS  04/17/2017  . NO PAST SURGERIES      ROS: Review of Systems Negative except as above. PHYSICAL EXAM: BP 120/81   Pulse 91   Temp 98.2 F (36.8 C) (Oral)   Resp 16   Ht 5\' 6"  (1.676 m)   Wt 153 lb 6.4 oz (69.6 kg)   SpO2 99%   BMI 24.76 kg/m   Wt Readings from Last 3 Encounters:  03/19/18 153 lb 6.4 oz (69.6 kg)  01/17/18 156 lb (70.8 kg)  10/09/17 145 lb 6.4 oz (66 kg)   Physical Exam  General appearance - alert, well appearing, and in no distress Mental status - normal mood, behavior, speech, dress, motor activity, and thought processes Neck - supple, no significant adenopathy Chest - clear to auscultation, no wheezes, rales or rhonchi, symmetric air entry Heart - normal rate, regular rhythm, normal S1, S2, no murmurs, rubs, clicks or gallops Extremities - peripheral pulses normal, no pedal edema, no clubbing or cyanosis   ASSESSMENT AND PLAN:  1. Hyperlipidemia, unspecified hyperlipidemia type Continue Lipitor.  He will return to the lab fasting tomorrow to have lipid profile and LFTs.  2. Chronic insomnia Good sleep hygiene discussed. I gave him the option of changing to Ambien 10 mg daily versus continuing Ambien is 6.25 mg CR and adding trazodone to it.  He opted for the latter at this time.  However if he finds the combination not to be helpful then he will try the 10 mg of regular Ambien which will also be less expensive for him.  Prescription sent to his pharmacy today for the trazodone  3. Tobacco dependence Commended him on cutting back.  Patient has set a quit date for next month.  He states that he does have the patches at home to use if needed  4. Need for Tdap vaccination Given  5. Influenza vaccination  declined    Patient was given the opportunity to ask questions.  Patient verbalized understanding of the plan and was able to repeat key elements of the plan.   No orders of the defined types were placed in this encounter.    Requested Prescriptions   Signed Prescriptions Disp Refills  . Tdap (BOOSTRIX) 5-2.5-18.5 LF-MCG/0.5 injection 0.5 mL 0  Sig: Inject 0.5 mLs into the muscle as directed.  . traZODone (DESYREL) 50 MG tablet 30 tablet 3    Sig: Take 1 tablet (50 mg total) by mouth at bedtime.    Return in about 3 months (around 06/18/2018).  [name], MD, FACP

## 2018-03-21 ENCOUNTER — Other Ambulatory Visit: Payer: Self-pay | Admitting: Physician Assistant

## 2018-03-21 DIAGNOSIS — E785 Hyperlipidemia, unspecified: Secondary | ICD-10-CM

## 2018-04-10 ENCOUNTER — Other Ambulatory Visit: Payer: Self-pay | Admitting: Internal Medicine

## 2018-04-17 ENCOUNTER — Other Ambulatory Visit: Payer: Self-pay | Admitting: Physician Assistant

## 2018-04-17 DIAGNOSIS — F5104 Psychophysiologic insomnia: Secondary | ICD-10-CM

## 2018-04-18 NOTE — Telephone Encounter (Signed)
Pt last seen: 03/19/18 Next appt: n/a Last RX written on: 01/17/18 Date of original fill: 01/22/18 Date of refill(s): 02/23/18, 03/23/18  No other controlled substances were filled during this time, please refill if appropriate.

## 2018-04-19 ENCOUNTER — Other Ambulatory Visit: Payer: Self-pay | Admitting: Physician Assistant

## 2018-04-19 DIAGNOSIS — F5104 Psychophysiologic insomnia: Secondary | ICD-10-CM

## 2018-04-19 NOTE — Telephone Encounter (Signed)
Ambien rx to filled on or after 04/22/18

## 2018-04-19 NOTE — Telephone Encounter (Signed)
[facility]ed [facility] [address] [facility] to call in Ambien rx and spoke to [place]   Name: Ambien Strength: 6.25MG  Sig: Take 1 tablet by mouth every day at bedtime prn Quantity: 30  Refills: 2

## 2018-05-08 ENCOUNTER — Other Ambulatory Visit: Payer: Self-pay | Admitting: Physician Assistant

## 2018-05-08 DIAGNOSIS — F5104 Psychophysiologic insomnia: Secondary | ICD-10-CM

## 2018-07-08 ENCOUNTER — Other Ambulatory Visit: Payer: Self-pay | Admitting: Internal Medicine

## 2018-07-08 DIAGNOSIS — F5104 Psychophysiologic insomnia: Secondary | ICD-10-CM

## 2018-07-10 ENCOUNTER — Other Ambulatory Visit: Payer: Self-pay | Admitting: Internal Medicine

## 2018-07-10 DIAGNOSIS — F5104 Psychophysiologic insomnia: Secondary | ICD-10-CM

## 2018-07-10 NOTE — Telephone Encounter (Signed)
[facility] [address] to call in Ambien 6.25MG  Rx. Spoke to [place]  Name: Ambien Strength: 6.25MG  Sig: Take 1 tablet (6.25 mg total) by mouth at bedtime as needed for sleep (fill on or after 07/19/2018). Quantity: 30 Refills: 0  Per Pharmacist he wanted [place] to be aware that pt is always trying to get Ambien filled early and he also be trying to get his mother Ambien as well. [facility] pharmacist [facility]. Pharmacist wanted to make provider aware. Per pharmacist he feels that pt is drug seeking when he is unable to get [place]

## 2018-08-01 ENCOUNTER — Other Ambulatory Visit: Payer: Self-pay | Admitting: Internal Medicine

## 2018-08-02 ENCOUNTER — Other Ambulatory Visit: Payer: Self-pay

## 2018-08-02 ENCOUNTER — Ambulatory Visit: Payer: Self-pay | Attending: Internal Medicine | Admitting: Internal Medicine

## 2018-08-05 ENCOUNTER — Encounter: Payer: Self-pay | Admitting: Internal Medicine

## 2018-08-05 ENCOUNTER — Ambulatory Visit: Payer: Self-pay | Attending: Internal Medicine | Admitting: Internal Medicine

## 2018-08-05 ENCOUNTER — Other Ambulatory Visit: Payer: Self-pay

## 2018-08-05 DIAGNOSIS — K7030 Alcoholic cirrhosis of liver without ascites: Secondary | ICD-10-CM

## 2018-08-05 DIAGNOSIS — F17200 Nicotine dependence, unspecified, uncomplicated: Secondary | ICD-10-CM

## 2018-08-05 DIAGNOSIS — E785 Hyperlipidemia, unspecified: Secondary | ICD-10-CM

## 2018-08-05 DIAGNOSIS — N529 Male erectile dysfunction, unspecified: Secondary | ICD-10-CM

## 2018-08-05 DIAGNOSIS — F5104 Psychophysiologic insomnia: Secondary | ICD-10-CM

## 2018-08-05 MED ORDER — SILDENAFIL CITRATE 100 MG PO TABS: 50.0000 mg | ORAL_TABLET | Freq: Every day | ORAL | 6 refills | Status: DC | PRN

## 2018-08-05 MED ORDER — ATORVASTATIN CALCIUM 10 MG PO TABS: 10.0000 mg | ORAL_TABLET | Freq: Every day | ORAL | 6 refills | Status: DC

## 2018-08-05 NOTE — Progress Notes (Signed)
Pt is requesting something for intercourse

## 2018-08-05 NOTE — Progress Notes (Signed)
Virtual Visit via Telephone Note Due to current restrictions/limitations of in-office visits due to the COVID-19 pandemic, this scheduled clinical appointment was converted to a telehealth visit  I connected with [name] on 08/05/18 at 9:17 a.m EDT by telephone and verified that I am speaking with the correct person using two identifiers. I am in my office.  The patient is at home.  Only the patient and myself participated in this encounter.  I discussed the limitations, risks, security and privacy concerns of performing an evaluation and management service by telephone and the availability of in person appointments. I also discussed with the patient that there may be a patient responsible charge related to this service. The patient expressed understanding and agreed to proceed.   History of Present Illness: Pt with hx of ETOH cirrhosis, tobacco dep, insomnia, anx/depand hyperlipidemia  ETOH cirrhosis:  Clean x 1.5 yrs  HL:  tolerating Lipitor.  Needs RF.  Insomnia:  Still has problems sleeping despite taking Ambien and trazodone.  He feels some of it is due to the fact that he is away from his son at times for extended periods.  I informed the patient that we were notified by his pharmacy that he sometimes tries to fill the prescription for his Ambien much sooner than he is supposed to and also tries to fill the same for his mother.  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  At times he is gone for about a week and if he knows that he will be gone during that time that he will be out of the medication he would try to get it filled before he leaves.  He also states that he manages/refills his mother's medications and has been doing that for a while.  He denies abusing the medication.    Quit smoking for 1 mth but it was hard.  Used Chantix in the past but he stopped because it caused bad dreams.  Tried patches in past.  "I think when I'm ready to quit, I  will.  I don't like taking these things."  Having problems getting and maintaining erection x 3 mths.  Same partner.  Questioning medication like Viagra to help with the ED.   Observations/Objective: No direct observation done as this was a telephone encounter.  Assessment and Plan: 1. Chronic insomnia Discussed and reinforced good sleep hygiene.  He may have to deal with the issue of being away from his son if he feels that this contributes to his insomnia.  However he feels that he still benefits from the Ambien and trazodone so we will keep him on those for now.  2. Hyperlipidemia, unspecified hyperlipidemia type - atorvastatin (LIPITOR) 10 MG tablet; Take 1 tablet (10 mg total) by mouth daily.  Dispense: 30 tablet; Refill: 6  3. Tobacco dependence Patient advised to quit smoking. Discussed health risks associated with smoking including lung and other types of cancers, chronic lung diseases and CV risks.. Pt not ready to give trail of quitting.  Less than 5 minutes spent on counseling  4. Erectile dysfunction, unspecified erectile dysfunction type Will give trail of Viagra Pt advised of possible side effects of this medication including prolong erection, flushing, headaches, stuff Barnes and sudden vision and hearing changes.  Pt told to be seen in ER if he has erection lasting longer than 2 hrs, or if he has sudden vision changes or hearing loss.  Advised that it works better if taken on  a relatively empty stomach. - sildenafil (VIAGRA) 100 MG tablet; Take 0.5-1 tablets (50-100 mg total) by mouth daily as needed for erectile dysfunction.  Dispense: 10 tablet; Refill: 6  5.  Alcoholic cirrhosis of the liver without ascites Patient has done well clinically. He remains free of alcohol.  He no longer takes lactulose.  Follow Up Instructions: F/u in 4 mths   I discussed the assessment and treatment plan with the patient. The patient was provided an opportunity to ask questions and all  were answered. The patient agreed with the plan and demonstrated an understanding of the instructions.   The patient was advised to call back or seek an in-person evaluation if the symptoms worsen or if the condition fails to improve as anticipated.  I provided 12 minutes of non-face-to-face time during this encounter.   [name], MD

## 2018-08-21 ENCOUNTER — Other Ambulatory Visit: Payer: Self-pay | Admitting: Internal Medicine

## 2018-08-21 DIAGNOSIS — F5104 Psychophysiologic insomnia: Secondary | ICD-10-CM

## 2018-08-21 NOTE — Telephone Encounter (Signed)
1) Medication(s) Requested (by name): zolpidem (AMBIEN CR) 6.25 MG CR tablet [[id number]]   2) [id number] [place], [place]  3) Special Requests:   Approved medications will be sent to the pharmacy, we will reach out if there is an issue.  Requests made after 3pm may not be addressed until the following business day!  If a patient is unsure of the name of the medication(s) please note and ask patient to call back when they are able to provide all info, do not send to responsible party until all information is available!

## 2018-08-23 NOTE — Telephone Encounter (Signed)
Verbal order given to pharmacist Mitzi Hansen with read back

## 2018-08-23 NOTE — Telephone Encounter (Signed)
Please phone in [place]

## 2018-09-28 ENCOUNTER — Other Ambulatory Visit: Payer: Self-pay | Admitting: Internal Medicine

## 2018-10-02 ENCOUNTER — Ambulatory Visit: Payer: Self-pay

## 2018-11-05 ENCOUNTER — Other Ambulatory Visit: Payer: Self-pay | Admitting: Physician Assistant

## 2018-11-05 DIAGNOSIS — F5104 Psychophysiologic insomnia: Secondary | ICD-10-CM

## 2018-11-08 ENCOUNTER — Telehealth: Payer: Self-pay | Admitting: Internal Medicine

## 2018-11-08 DIAGNOSIS — F5104 Psychophysiologic insomnia: Secondary | ICD-10-CM

## 2018-11-08 MED ORDER — ZOLPIDEM TARTRATE ER 6.25 MG PO TBCR: EXTENDED_RELEASE_TABLET | ORAL | 1 refills | Status: DC

## 2018-11-08 NOTE — Telephone Encounter (Signed)
1) Medication(s) Requested (by name): zolpidem 2) Pharmacy [id number] [place], [place]

## 2018-11-08 NOTE — Telephone Encounter (Signed)
Will forward to pcp

## 2018-11-22 IMAGING — MR MR ABDOMEN WO/W CM
10 of 16 series · 21 of 48 positions shown · IV contrast (multihance)
Comparison: 04/17/2017 unenhanced CT abdomen/pelvis.

CLINICAL DATA: Hepatic cirrhosis. Heterogeneous liver on recent CT
study.

EXAM:
MRI ABDOMEN WITHOUT AND WITH CONTRAST
TECHNIQUE: Multiplanar multisequence MR imaging of the abdomen was performed
both before and after the administration of intravenous contrast.
CONTRAST:  11mL MULTIHANCE GADOBENATE DIMEGLUMINE 529 MG/ML IV SOLN

[Series 3: T2 · coronal · 5.1mm · 0.74mm/px · 1 of 42 slices shown (1 of 2)]
[im 1/42]
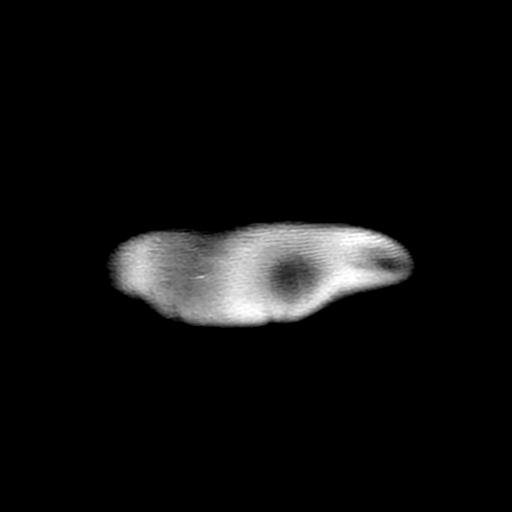

[Series 4: T2 · axial · 5.0mm · 0.74mm/px · 1 of 38 slices shown (2 of 2)]
[im 1/38]
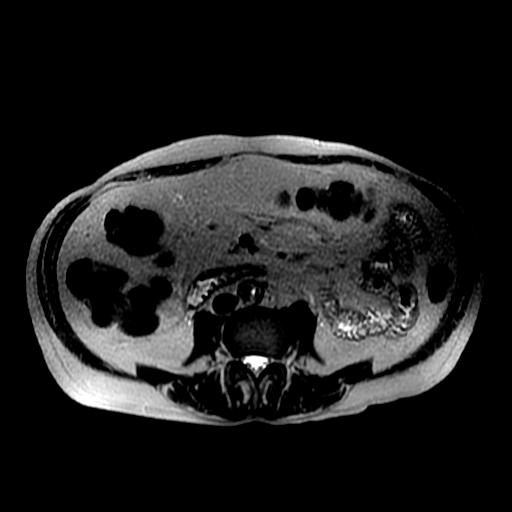

[Series 5: T2 fat-sat · axial · 5.0mm · 0.74mm/px · 1 of 38 slices shown]
[im 1/38]
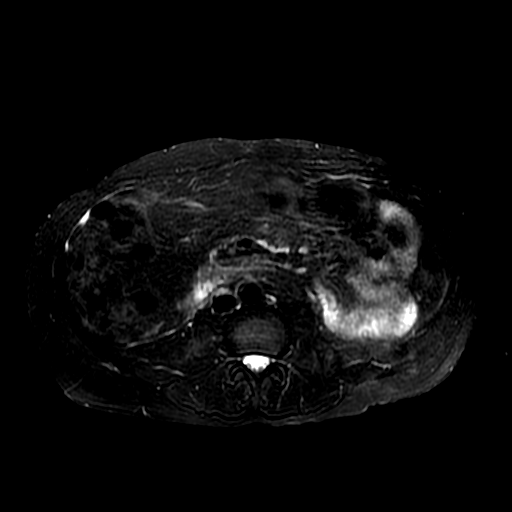

[Series 6: DWI b500 · axial · 5.0mm · 1.48mm/px · z∈[-109,+113]mm · 2 of 76 slices shown]
[im 1/76]
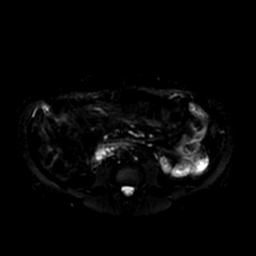
[im 76/76]
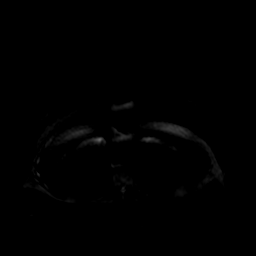

[Series 9: ax dualecho · axial · 5.0mm · 0.74mm/px · z∈[-103,+113]mm · 2 of 74 slices shown]
[im 1/74]
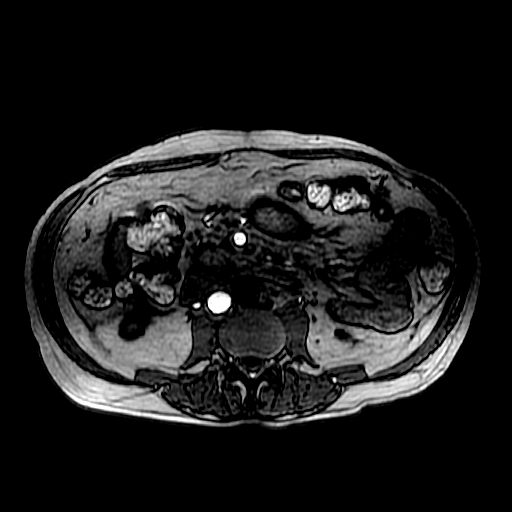
[im 74/74]
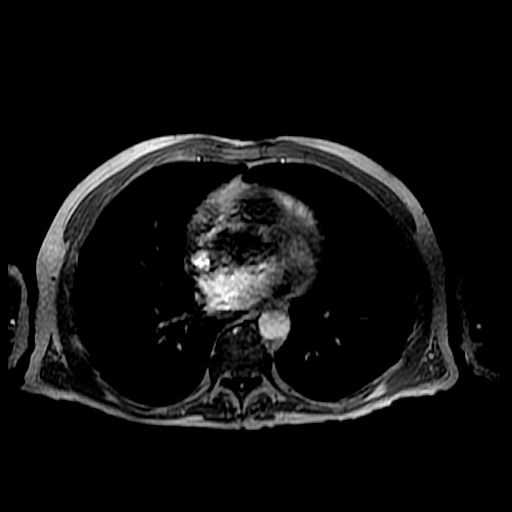

[Series 11: T1 dynamic post-contrast · coronal · 5.0mm · 0.74mm/px · 3 of 88 slices shown]
[im 1/88]
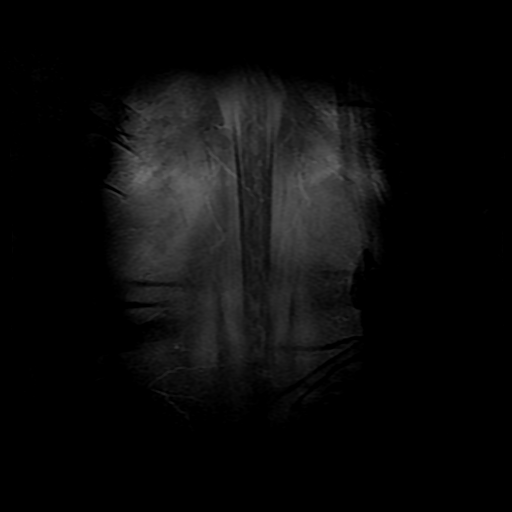
[im 44/88]
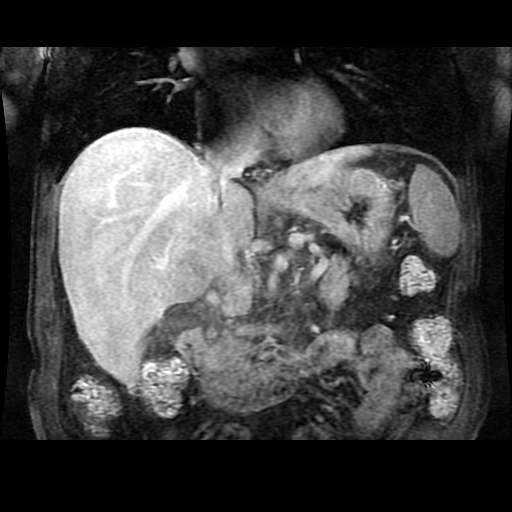
[im 88/88]
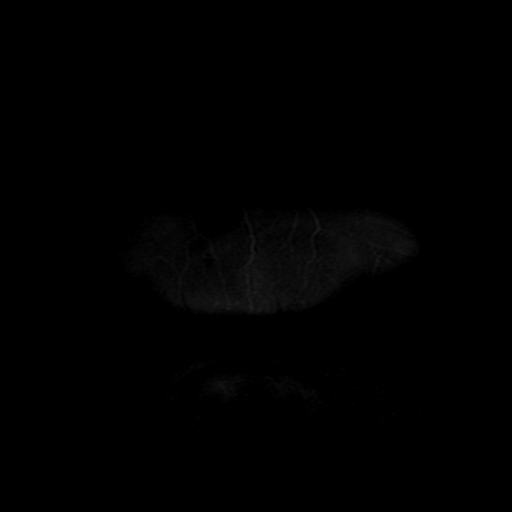

[Series 600: DWI · axial · 5.0mm · 1.48mm/px · z∈[-109,+113]mm · 2 of 38 slices shown]
[im 1/38]
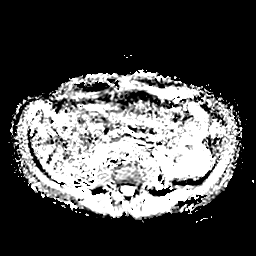
[im 38/38]
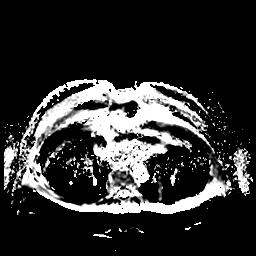

[Series 1000: T1 dynamic · axial · 5.0mm · 0.74mm/px · z∈[-106,+111]mm · 4 of 88 slices shown (1 of 3)]
[im 1/88]
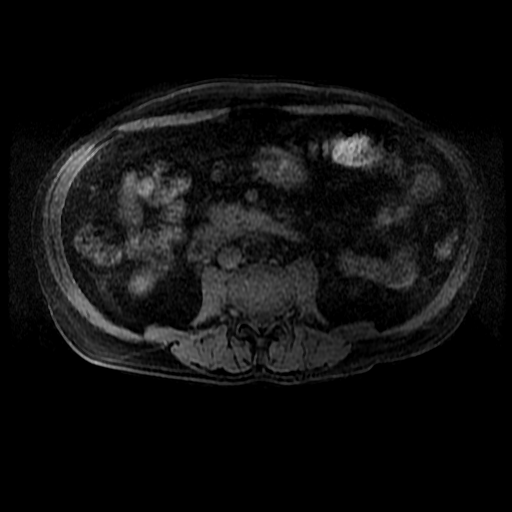
[im 30/88]
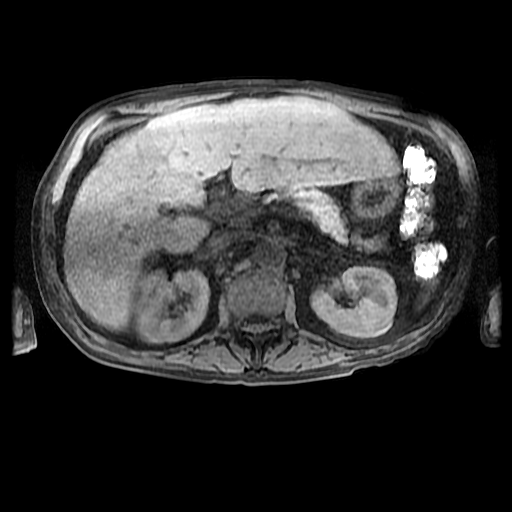
[im 59/88]
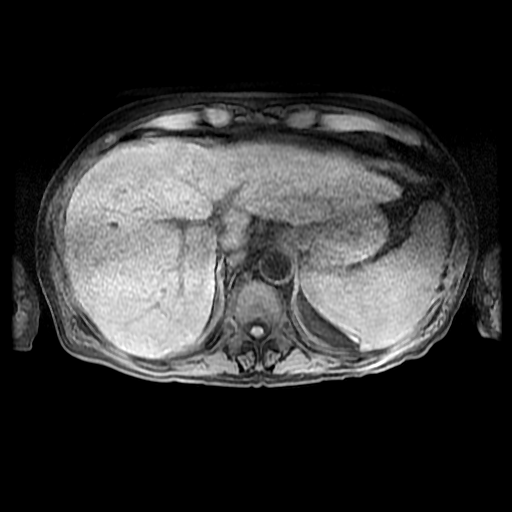
[im 88/88]
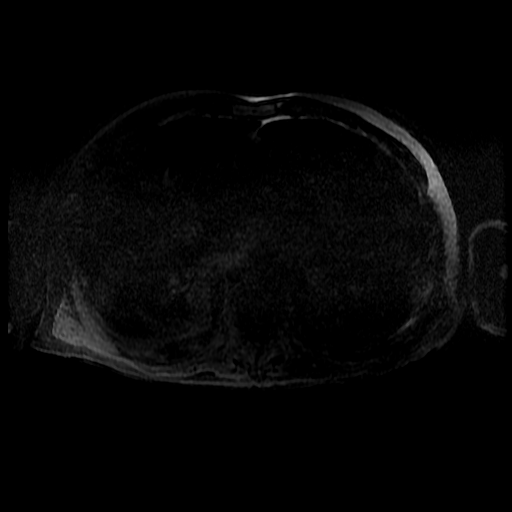

[Series 1001: T1 dynamic · axial · 5.0mm · 0.74mm/px · z∈[-106,+111]mm · 4 of 88 slices shown (2 of 3)]
[im 1/88]
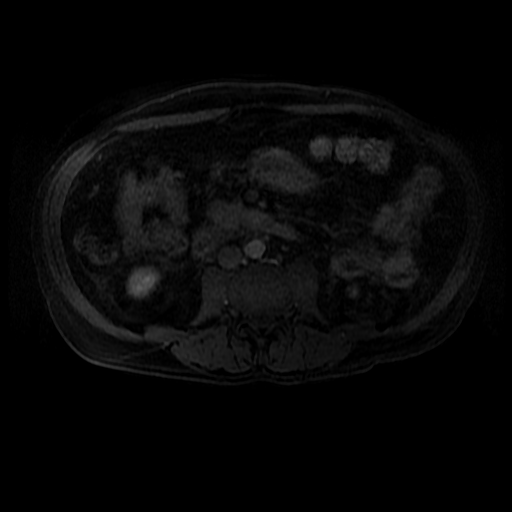
[im 30/88]
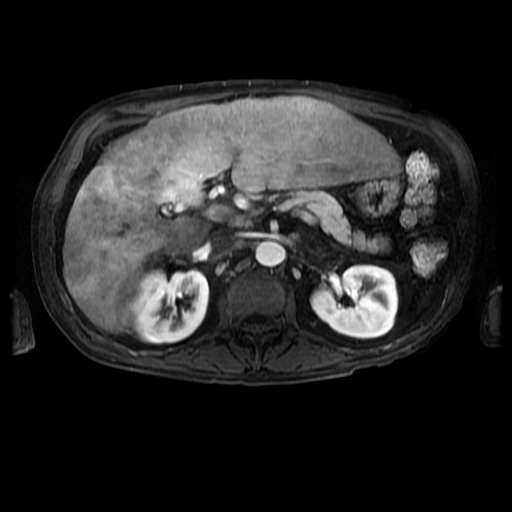
[im 59/88]
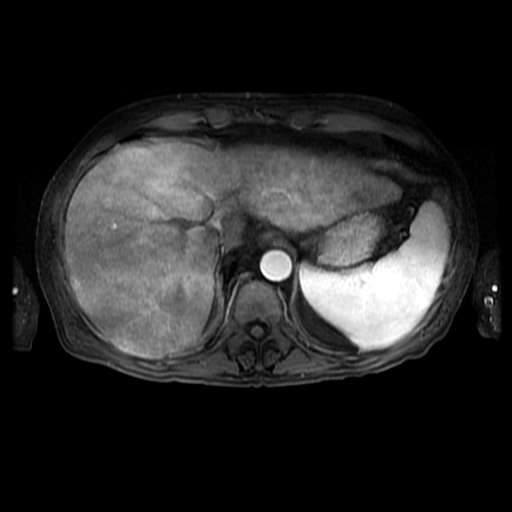
[im 88/88]
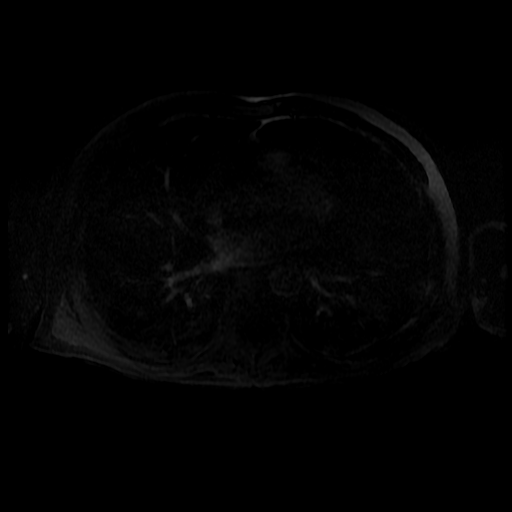

[Series 1002: T1 dynamic · axial · 5.0mm · 0.74mm/px · 1 of 88 slices shown (3 of 3)]
[im 1/88]
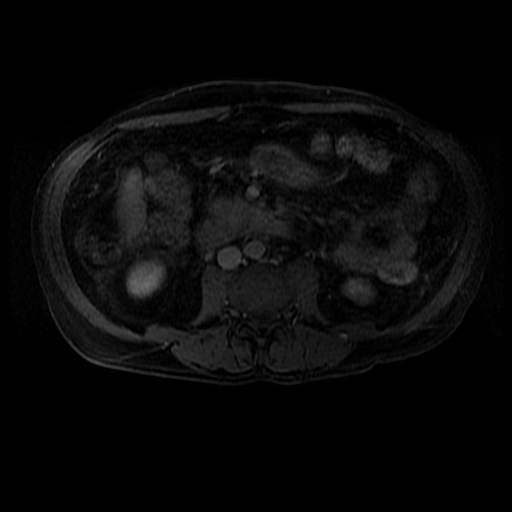

[21 of 48 positions shown; findings below may reference images not displayed]

FINDINGS: Lower chest: No acute abnormality at the lung bases.

Hepatobiliary: Mild hepatomegaly with relative hypertrophy of the
lateral segment left liver lobe and diffusely mildly irregular liver
surface, compatible with hepatic cirrhosis. No significant hepatic
steatosis. There are a few scattered subcentimeter simple liver
cysts. No suspicious liver masses. Normal gallbladder with no
cholelithiasis. No biliary ductal dilatation. Common bile duct
diameter 3 mm. No choledocholithiasis.

Pancreas: No pancreatic mass or duct dilation.  No pancreas divisum.

Spleen: Normal size. No mass.

Adrenals/Urinary Tract: Normal adrenals. No hydronephrosis. There
are a few scattered subcentimeter renal cysts bilaterally. No
suspicious renal masses.

Stomach/Bowel: Grossly normal stomach. Visualized small and large
bowel is normal caliber, with no bowel wall thickening.

Vascular/Lymphatic: Atherosclerotic nonaneurysmal abdominal aorta.
Patent portal, splenic, hepatic and renal veins. Small esophageal
and paraumbilical varices. Mild porta hepatis adenopathy up to
cm (series [DATE]/image 55), unchanged since 04/17/2017 CT. Mildly
enlarged 1.3 cm gastrohepatic ligament node (series [DATE]/image 45),
stable.

Other: No abdominal ascites or focal fluid collection.

Musculoskeletal: No aggressive appearing focal osseous lesions.
IMPRESSION: 1. Hepatic cirrhosis.  No suspicious liver masses.
2. Small esophageal and paraumbilical varices. No ascites. No
splenomegaly.
3. Nonspecific stable mild porta hepatis/gastrohepatic ligament
lymphadenopathy, more likely reactive.
4.  Aortic Atherosclerosis ([id number]).

## 2018-12-21 ENCOUNTER — Other Ambulatory Visit: Payer: Self-pay | Admitting: Internal Medicine

## 2018-12-21 DIAGNOSIS — N529 Male erectile dysfunction, unspecified: Secondary | ICD-10-CM

## 2018-12-24 ENCOUNTER — Other Ambulatory Visit: Payer: Self-pay | Admitting: Internal Medicine

## 2019-01-04 ENCOUNTER — Other Ambulatory Visit: Payer: Self-pay | Admitting: Internal Medicine

## 2019-01-04 DIAGNOSIS — N529 Male erectile dysfunction, unspecified: Secondary | ICD-10-CM

## 2019-01-08 ENCOUNTER — Other Ambulatory Visit: Payer: Self-pay | Admitting: Internal Medicine

## 2019-01-08 DIAGNOSIS — F5104 Psychophysiologic insomnia: Secondary | ICD-10-CM

## 2019-01-08 NOTE — Telephone Encounter (Signed)
[name] could please contact pt

## 2019-01-08 NOTE — Telephone Encounter (Signed)
Called pt, LVM stating his medication is at the pharmacy and that he will need an office visit before any other refills are given.

## 2019-01-10 ENCOUNTER — Other Ambulatory Visit: Payer: Self-pay | Admitting: Internal Medicine

## 2019-01-10 DIAGNOSIS — N529 Male erectile dysfunction, unspecified: Secondary | ICD-10-CM

## 2019-01-16 ENCOUNTER — Other Ambulatory Visit: Payer: Self-pay | Admitting: Internal Medicine

## 2019-01-24 ENCOUNTER — Other Ambulatory Visit: Payer: Self-pay | Admitting: Internal Medicine

## 2019-01-28 ENCOUNTER — Other Ambulatory Visit: Payer: Self-pay | Admitting: Internal Medicine

## 2019-02-06 ENCOUNTER — Ambulatory Visit: Payer: Self-pay | Attending: Internal Medicine | Admitting: Internal Medicine

## 2019-02-06 ENCOUNTER — Encounter: Payer: Self-pay | Admitting: Internal Medicine

## 2019-02-06 ENCOUNTER — Other Ambulatory Visit: Payer: Self-pay

## 2019-02-06 VITALS — BP 126/84 | HR 90 | Temp 98.4°F | Resp 16 | Ht 67.0 in | Wt 151.2 lb

## 2019-02-06 DIAGNOSIS — F5104 Psychophysiologic insomnia: Secondary | ICD-10-CM

## 2019-02-06 DIAGNOSIS — F17200 Nicotine dependence, unspecified, uncomplicated: Secondary | ICD-10-CM

## 2019-02-06 DIAGNOSIS — F17210 Nicotine dependence, cigarettes, uncomplicated: Secondary | ICD-10-CM

## 2019-02-06 DIAGNOSIS — Z125 Encounter for screening for malignant neoplasm of prostate: Secondary | ICD-10-CM

## 2019-02-06 DIAGNOSIS — R030 Elevated blood-pressure reading, without diagnosis of hypertension: Secondary | ICD-10-CM

## 2019-02-06 DIAGNOSIS — N529 Male erectile dysfunction, unspecified: Secondary | ICD-10-CM

## 2019-02-06 DIAGNOSIS — Z0000 Encounter for general adult medical examination without abnormal findings: Secondary | ICD-10-CM

## 2019-02-06 DIAGNOSIS — E785 Hyperlipidemia, unspecified: Secondary | ICD-10-CM

## 2019-02-06 DIAGNOSIS — Z2821 Immunization not carried out because of patient refusal: Secondary | ICD-10-CM

## 2019-02-06 DIAGNOSIS — Z0001 Encounter for general adult medical examination with abnormal findings: Secondary | ICD-10-CM

## 2019-02-06 MED ORDER — ZOLPIDEM TARTRATE ER 6.25 MG PO TBCR: EXTENDED_RELEASE_TABLET | ORAL | 3 refills | Status: DC

## 2019-02-06 MED ORDER — NICOTINE 21 MG/24HR TD PT24: 21.0000 mg | MEDICATED_PATCH | Freq: Every day | TRANSDERMAL | 1 refills | Status: AC

## 2019-02-06 MED ORDER — TRAZODONE HCL 50 MG PO TABS: 50.0000 mg | ORAL_TABLET | Freq: Every evening | ORAL | 0 refills | Status: DC | PRN

## 2019-02-06 MED ORDER — SILDENAFIL CITRATE 100 MG PO TABS: ORAL_TABLET | ORAL | 6 refills | Status: DC

## 2019-02-06 MED ORDER — ATORVASTATIN CALCIUM 10 MG PO TABS: 10.0000 mg | ORAL_TABLET | Freq: Every day | ORAL | 6 refills | Status: DC

## 2019-02-06 NOTE — Patient Instructions (Signed)
I have sent the prescription to the pharmacy for the nicotine patches.

## 2019-02-06 NOTE — Progress Notes (Signed)
Patient ID: [name], male    DOB: [birth year]  MRN: [id number]  CC: Annual Exam   Subjective: [name] is a 53 y.o. male who presents for annual exam His concerns today include:  Pt with hx of ETOH cirrhosis (resolved), tobacco dep, erectile dysfunction, chronic insomnia, anxiety/depressionand hyperlipidemia  ED:  Doing good on sildenafil.  ETOH Cirrhosis: clean x 2 yrs.  HL:  taking Lipitor Q 2-3 a wk.  Causes some throat irritaion for 1/2 hr after taking it and "sometimes I forget to take it." Jogging 2 miles in the mornings and does some push-ups and sit ups at night.      Tob dep:  Down to 1/2 pk a day.  Thinks he is now ready to try to quit smoking since he is kicked the alcohol addiction.  Chantix caused bad dreams in past.  Insomnia:  Ambien and Trazodone working fine.  Not getting in adequate sleep at nights.  Diastolic blood pressure noted to be elevated.  Patient reports that he drinks two 16 ounce cups of coffee a day.  He does not use salt in his foods  HM: Declines flu shot Patient Active Problem List   Diagnosis Date Noted  . Anxiety and depression 05/02/2017  . Liver lesion 05/02/2017  . H/O alcohol abuse 05/02/2017  . Tachycardia 04/16/2017  . Hyperlipidemia 06/09/2016  . Anemia 11/27/2014  . Chronic insomnia 11/26/2014  . Liver cirrhosis ([place]) 09/21/2014  . Tobacco abuse 03/15/2011     Current Outpatient Medications on File Prior to Visit  Medication Sig Dispense Refill  . Tdap (BOOSTRIX) 5-2.5-18.5 LF-MCG/0.5 injection Inject 0.5 mLs into the muscle as directed. 0.5 mL 0   No current facility-administered medications on file prior to visit.    No Known Allergies  Social History   Socioeconomic History  . Marital status: Legally Separated    Spouse name: Not on file  . Number of children: Not on file  . Years of education: Not on file  . Highest education level: Not on file  Occupational History  . Not on file  Tobacco  Use  . Smoking status: Current Every Day Smoker    Packs/day: 0.50    Types: Cigarettes  . Smokeless tobacco: Never Used  Substance and Sexual Activity  . Alcohol use: Yes    Comment: pt states last drink was 3 days ago  . Drug use: No  . Sexual activity: Never  Other Topics Concern  . Not on file  Social History Narrative  . Not on file   Social Determinants of Health   Financial Resource Strain:   . Difficulty of Paying Living Expenses: Not on file  Food Insecurity:   . Worried About Charity fundraiser in the Last Year: Not on file  . Ran Out of Food in the Last Year: Not on file  Transportation Needs:   . Lack of Transportation (Medical): Not on file  . Lack of Transportation (Non-Medical): Not on file  Physical Activity:   . Days of Exercise per Week: Not on file  . Minutes of Exercise per Session: Not on file  Stress:   . Feeling of Stress : Not on file  Social Connections:   . Frequency of Communication with Friends and Family: Not on file  . Frequency of Social Gatherings with Friends and Family: Not on file  . Attends Religious Services: Not on file  . Active Member of Clubs or Organizations: Not on file  .  Attends Archivist Meetings: Not on file  . Marital Status: Not on file  Intimate Partner Violence:   . Fear of Current or Ex-Partner: Not on file  . Emotionally Abused: Not on file  . Physically Abused: Not on file  . Sexually Abused: Not on file    Family History  Problem Relation Age of Onset  . Coronary artery disease Unknown   . Diabetes type II Unknown     Past Surgical History:  Procedure Laterality Date  . ESOPHAGOGASTRODUODENOSCOPY N/A 04/19/2017   Procedure: ESOPHAGOGASTRODUODENOSCOPY (EGD);  Surgeon: Doran Stabler, MD;  Location: [place];  Service: Gastroenterology;  Laterality: N/A;  . IR PARACENTESIS  04/17/2017  . NO PAST SURGERIES      ROS: Review of Systems Negative except as stated above  PHYSICAL EXAM: BP  126/84   Pulse 90   Temp 98.4 F (36.9 C) (Oral)   Resp 16   Ht 5\' 7"  (1.702 m)   Wt 151 lb 3.2 oz (68.6 kg)   SpO2 96%   BMI 23.68 kg/m   Wt Readings from Last 3 Encounters:  02/06/19 151 lb 3.2 oz (68.6 kg)  03/19/18 153 lb 6.4 oz (69.6 kg)  01/17/18 156 lb (70.8 kg)    Physical Exam  General appearance - alert, well appearing, middle-age male and in no distress Mental status - normal mood, behavior, speech, dress, motor activity, and thought processes Eyes -muddy sclera, pink conjunctiva  nose - normal and patent, no erythema, discharge or polyps Mouth - mucous membranes moist, pharynx normal without lesions.  Teeth in the upper jaw are have dark brown stains Neck -no cervical lymphadenopathy.  No thyroid enlargement or nodules palpated.   Lymphatics -no axillary lymphadenopathy Chest - clear to auscultation, no wheezes, rales or rhonchi, symmetric air entry Heart - normal rate, regular rhythm, normal S1, S2, no murmurs, rubs, clicks or gallops Abdomen - soft, nontender, nondistended, no masses or organomegaly Extremities - peripheral pulses normal, no pedal edema, no clubbing or cyanosis  CMP Latest Ref Rng & Units 10/19/2017 04/30/2017 04/20/2017  Glucose 65 - 99 mg/dL 100(H) 87 92  BUN 6 - 24 mg/dL 10 14 <5(L)  Creatinine 0.76 - 1.27 mg/dL 0.83 0.74(L) 0.45(L)  Sodium 134 - 144 mmol/L 139 143 138  Potassium 3.5 - 5.2 mmol/L 4.1 3.9 3.8  Chloride 96 - 106 mmol/L 98 100 104  CO2 20 - 29 mmol/L 24 25 22   Calcium 8.7 - 10.2 mg/dL 9.3 8.8 7.8(L)  Total Protein 6.0 - 8.5 g/dL 7.2 7.6 -  Total Bilirubin 0.0 - 1.2 mg/dL 0.5 0.4 -  Alkaline Phos 39 - 117 IU/L 69 168(H) -  AST 0 - 40 IU/L 11 70(H) -  ALT 0 - 44 IU/L 8 29 -   Lipid Panel     Component Value Date/Time   CHOL 272 (H) 10/19/2017 1026   TRIG 210 (H) 10/19/2017 1026   HDL 29 (L) 10/19/2017 1026   CHOLHDL 9.4 (H) 10/19/2017 1026   CHOLHDL 3.6 10/20/2010 0320   VLDL 15 10/20/2010 0320   LDLCALC 201 (H)  10/19/2017 1026    CBC    Component Value Date/Time   WBC 12.2 (H) 09/14/2017 1419   WBC 12.8 (H) 04/20/2017 0421   RBC 5.25 09/14/2017 1419   RBC 3.78 (L) 04/20/2017 0421   HGB 14.2 09/14/2017 1419   HCT 43.1 09/14/2017 1419   PLT 317 09/14/2017 1419   MCV 82 09/14/2017 1419  MCH 27.0 09/14/2017 1419   MCH 29.4 04/20/2017 0421   MCHC 32.9 09/14/2017 1419   MCHC 31.5 04/20/2017 0421   RDW 14.7 09/14/2017 1419   LYMPHSABS 3.5 06/25/2015 0043   MONOABS 1.9 (H) 06/25/2015 0043   EOSABS 0.1 06/25/2015 0043   BASOSABS 0.0 06/25/2015 0043    ASSESSMENT AND PLAN:  1. Annual physical exam - CBC - Comprehensive metabolic panel  2. Tobacco dependence Patient advised to quit smoking. Discussed health risks associated with smoking including lung and other types of cancers, chronic lung diseases and CV risks.. Pt ready to give trail of quitting.  Discussed methods to help quit.  He is willing to try the nicotine patches.  I went over the stepdown approach with him.  We will start with the 21 mg which he will use for 1 to 2 months before stepping down to the next level.  He will let me know once he is ready to step down to the 14 mg.  Less than 5 minutes spent on counseling - nicotine (NICODERM CQ - DOSED IN MG/24 HOURS) 21 mg/24hr patch; Place 1 patch (21 mg total) onto the skin daily.  Dispense: 28 patch; Refill: 1  3. Hyperlipidemia, unspecified hyperlipidemia type - atorvastatin (LIPITOR) 10 MG tablet; Take 1 tablet (10 mg total) by mouth daily.  Dispense: 30 tablet; Refill: 6 - Lipid panel  4. Chronic insomnia Refill given on trazodone and Ambien.  [place] controlled substance reporting system reviewed.  Good sleep hygiene discussed and encourage - traZODone (DESYREL) 50 MG tablet; Take 1 tablet (50 mg total) by mouth at bedtime as needed for sleep.  Dispense: 30 tablet; Refill: 0 - zolpidem (AMBIEN CR) 6.25 MG CR tablet; TAKE 1 TABLET BY MOUTH EVERY DAY AT BEDTIME AS  NEEDED.  Fill on or after 02/10/2019.  Each prescription to last 1 mth  Dispense: 30 tablet; Refill: 3  5. Erectile dysfunction, unspecified erectile dysfunction type - sildenafil (VIAGRA) 100 MG tablet; TAKE 1/2 TO 1 TABLET BY MOUTH DAILY AS NEEDED  Dispense: 10 tablet; Refill: 6  6. Elevated blood pressure reading DASH diet discussed - Comprehensive metabolic panel  7. Influenza vaccination declined Offered and was declined  8. Prostate cancer screening Discussed prostate cancer screening.  Patient agreeable to PSA level being checked. - PSA   Patient was given the opportunity to ask questions.  Patient verbalized understanding of the plan and was able to repeat key elements of the plan.   Orders Placed This Encounter  Procedures  . CBC  . Comprehensive metabolic panel  . Lipid panel  . PSA     Requested Prescriptions   Signed Prescriptions Disp Refills  . atorvastatin (LIPITOR) 10 MG tablet 30 tablet 6    Sig: Take 1 tablet (10 mg total) by mouth daily.  . sildenafil (VIAGRA) 100 MG tablet 10 tablet 6    Sig: TAKE 1/2 TO 1 TABLET BY MOUTH DAILY AS NEEDED  . traZODone (DESYREL) 50 MG tablet 30 tablet 0    Sig: Take 1 tablet (50 mg total) by mouth at bedtime as needed for sleep.  Marland Kitchen zolpidem (AMBIEN CR) 6.25 MG CR tablet 30 tablet 3    Sig: TAKE 1 TABLET BY MOUTH EVERY DAY AT BEDTIME AS NEEDED.  Fill on or after 02/10/2019.  Each prescription to last 1 mth  . nicotine (NICODERM CQ - DOSED IN MG/24 HOURS) 21 mg/24hr patch 28 patch 1    Sig: Place 1 patch (21 mg total)  onto the skin daily.    Return in about 6 months (around 08/07/2019).  [name], MD, FACP

## 2019-02-07 LAB — LIPID PANEL
Chol/HDL Ratio: 7.4 ratio — ABNORMAL HIGH (ref 0.0–5.0)
Cholesterol, Total: 287 mg/dL — ABNORMAL HIGH (ref 100–199)
HDL: 39 mg/dL — ABNORMAL LOW (ref >39)
LDL Chol Calc (NIH): 213 mg/dL — ABNORMAL HIGH (ref 0–99)
Triglycerides: 181 mg/dL — ABNORMAL HIGH (ref 0–149)
VLDL Cholesterol Cal: 35 mg/dL (ref 5–40)

## 2019-02-07 LAB — CBC
Hematocrit: 48.3 % (ref 37.5–51.0)
Hemoglobin: 16.3 g/dL (ref 13.0–17.7)
MCH: 29.3 pg (ref 26.6–33.0)
MCHC: 33.7 g/dL (ref 31.5–35.7)
MCV: 87 fL (ref 79–97)
Platelets: 306 10*3/uL (ref 150–450)
RBC: 5.57 x10E6/uL (ref 4.14–5.80)
RDW: 13.3 % (ref 11.6–15.4)
WBC: 13.4 10*3/uL — ABNORMAL HIGH (ref 3.4–10.8)

## 2019-02-07 LAB — COMPREHENSIVE METABOLIC PANEL
ALT: 8 IU/L (ref 0–44)
AST: 12 IU/L (ref 0–40)
Albumin/Globulin Ratio: 1.6 (ref 1.2–2.2)
Albumin: 4.6 g/dL (ref 3.8–4.9)
Alkaline Phosphatase: 81 IU/L (ref 39–117)
BUN/Creatinine Ratio: 6 — ABNORMAL LOW (ref 9–20)
BUN: 5 mg/dL — ABNORMAL LOW (ref 6–24)
Bilirubin Total: 0.4 mg/dL (ref 0.0–1.2)
CO2: 26 mmol/L (ref 20–29)
Calcium: 9.5 mg/dL (ref 8.7–10.2)
Chloride: 98 mmol/L (ref 96–106)
Creatinine, Ser: 0.82 mg/dL (ref 0.76–1.27)
GFR calc Af Amer: 115 mL/min/{1.73_m2} (ref >59)
GFR calc non Af Amer: 100 mL/min/{1.73_m2} (ref >59)
Globulin, Total: 2.9 g/dL (ref 1.5–4.5)
Glucose: 88 mg/dL (ref 65–99)
Potassium: 4.5 mmol/L (ref 3.5–5.2)
Sodium: 137 mmol/L (ref 134–144)
Total Protein: 7.5 g/dL (ref 6.0–8.5)

## 2019-02-07 LAB — PSA: Prostate Specific Ag, Serum: 3.1 ng/mL (ref 0.0–4.0)

## 2019-02-10 ENCOUNTER — Telehealth: Payer: Self-pay

## 2019-02-10 NOTE — Telephone Encounter (Signed)
Contacted pt to go over lab results pt is aware and doesn't have any questions or concerns 

## 2019-02-19 LAB — WHITE BLOOD COUNT AND DIFFERENTIAL

## 2019-02-19 LAB — SPECIMEN STATUS REPORT

## 2019-03-01 ENCOUNTER — Other Ambulatory Visit: Payer: Self-pay | Admitting: Internal Medicine

## 2019-03-01 DIAGNOSIS — E785 Hyperlipidemia, unspecified: Secondary | ICD-10-CM

## 2019-03-05 ENCOUNTER — Other Ambulatory Visit: Payer: Self-pay | Admitting: Internal Medicine

## 2019-03-05 DIAGNOSIS — F5104 Psychophysiologic insomnia: Secondary | ICD-10-CM

## 2019-04-02 ENCOUNTER — Other Ambulatory Visit: Payer: Self-pay | Admitting: Internal Medicine

## 2019-04-02 DIAGNOSIS — F5104 Psychophysiologic insomnia: Secondary | ICD-10-CM

## 2019-04-10 ENCOUNTER — Other Ambulatory Visit: Payer: Self-pay | Admitting: Internal Medicine

## 2019-04-10 DIAGNOSIS — F5104 Psychophysiologic insomnia: Secondary | ICD-10-CM

## 2019-06-23 ENCOUNTER — Other Ambulatory Visit: Payer: Self-pay | Admitting: Internal Medicine

## 2019-06-23 DIAGNOSIS — N529 Male erectile dysfunction, unspecified: Secondary | ICD-10-CM

## 2019-07-08 ENCOUNTER — Telehealth: Payer: Self-pay | Admitting: Internal Medicine

## 2019-07-08 DIAGNOSIS — F5104 Psychophysiologic insomnia: Secondary | ICD-10-CM

## 2019-07-08 NOTE — Telephone Encounter (Signed)
zolpidem (AMBIEN CR) 6.25 MG CR tablet [id number]   [facility] [id number]  [place], [place] [address]  Phone:  [phone] Fax:  [phone]

## 2019-07-09 MED ORDER — ZOLPIDEM TARTRATE ER 6.25 MG PO TBCR: EXTENDED_RELEASE_TABLET | ORAL | 0 refills | Status: DC

## 2019-07-09 NOTE — Telephone Encounter (Signed)
Pt is schedule for 5/13 at 130pm

## 2019-07-10 ENCOUNTER — Other Ambulatory Visit: Payer: Self-pay

## 2019-07-10 ENCOUNTER — Ambulatory Visit: Payer: Self-pay | Attending: Internal Medicine | Admitting: Internal Medicine

## 2019-07-10 ENCOUNTER — Encounter: Payer: Self-pay | Admitting: Internal Medicine

## 2019-07-10 VITALS — BP 118/85 | HR 108 | Temp 97.0°F | Resp 16 | Wt 152.2 lb

## 2019-07-10 DIAGNOSIS — F17200 Nicotine dependence, unspecified, uncomplicated: Secondary | ICD-10-CM

## 2019-07-10 DIAGNOSIS — R030 Elevated blood-pressure reading, without diagnosis of hypertension: Secondary | ICD-10-CM

## 2019-07-10 DIAGNOSIS — E785 Hyperlipidemia, unspecified: Secondary | ICD-10-CM

## 2019-07-10 DIAGNOSIS — F5104 Psychophysiologic insomnia: Secondary | ICD-10-CM

## 2019-07-10 DIAGNOSIS — K7030 Alcoholic cirrhosis of liver without ascites: Secondary | ICD-10-CM

## 2019-07-10 DIAGNOSIS — D72829 Elevated white blood cell count, unspecified: Secondary | ICD-10-CM

## 2019-07-10 MED ORDER — ATORVASTATIN CALCIUM 10 MG PO TABS: 10.0000 mg | ORAL_TABLET | Freq: Every day | ORAL | 6 refills | Status: DC

## 2019-07-10 MED ORDER — TRAZODONE HCL 50 MG PO TABS: ORAL_TABLET | ORAL | 6 refills | Status: DC

## 2019-07-10 NOTE — Progress Notes (Signed)
Patient ID: [name], male    DOB: Jan 13, 1966  MRN: [id number]  CC: Insomnia and Hyperlipidemia   Subjective: [name] is a 54 y.o. male who presents for chronic disease management His concerns today include:  Pt with hx of ETOH cirrhosis (resolved), tobacco dep, erectile dysfunction, chronic insomnia, anxiety/depressionand hyperlipidemia  HM:  Had [place] vac series 3/25, 06/16/19  BP elev on last visit.  Limiting salt in foods. Still exercising regularly - walks in mornings and works Architect  HL:  Taking Lipitor and tolerating.  Not at goal when checked on last visit but at that time patient had been off the Lipitor.  Since then he has been taking it consistently.  Tob Dep: did not get patches but reports he has cut back to 1/2 pk from 1 pk/day.  Goal is to quit eventually.  He tells me that he tried to quit cold [place] but the cravings were too strong.  ETOH cirrhosis: has not drink since 03/2017.  No increase abdominal girth.    Insomnia: Doing okay on Ambien and trazodone.  Recently requested refill on Ambien.  Patient has a mild persistent leukocytosis.  He denies any night sweats.  No major weight changes Patient Active Problem List   Diagnosis Date Noted  . Erectile dysfunction 02/06/2019  . Anxiety and depression 05/02/2017  . Liver lesion 05/02/2017  . H/O alcohol abuse 05/02/2017  . Hyperlipidemia 06/09/2016  . Anemia 11/27/2014  . Chronic insomnia 11/26/2014  . Liver cirrhosis ([place]) 09/21/2014  . Tobacco abuse 03/15/2011     Current Outpatient Medications on File Prior to Visit  Medication Sig Dispense Refill  . nicotine (NICODERM CQ - DOSED IN MG/24 HOURS) 21 mg/24hr patch Place 1 patch (21 mg total) onto the skin daily. 28 patch 1  . sildenafil (VIAGRA) 100 MG tablet TAKE 1/2 TO 1 TABLET BY MOUTH DAILY AS NEEDED 10 tablet 2  . zolpidem (AMBIEN CR) 6.25 MG CR tablet TAKE 1 TABLET BY MOUTH EVERYDAY AT BEDTIME.  Each prescription  to last 1 month 30 tablet 0   No current facility-administered medications on file prior to visit.    No Known Allergies  Social History   Socioeconomic History  . Marital status: Legally Separated    Spouse name: Not on file  . Number of children: Not on file  . Years of education: Not on file  . Highest education level: Not on file  Occupational History  . Not on file  Tobacco Use  . Smoking status: Current Every Day Smoker    Packs/day: 0.50    Types: Cigarettes  . Smokeless tobacco: Never Used  Substance and Sexual Activity  . Alcohol use: Yes    Comment: pt states last drink was 3 days ago  . Drug use: No  . Sexual activity: Never  Other Topics Concern  . Not on file  Social History Narrative  . Not on file   Social Determinants of Health   Financial Resource Strain:   . Difficulty of Paying Living Expenses:   Food Insecurity:   . Worried About Charity fundraiser in the Last Year:   . Arboriculturist in the Last Year:   Transportation Needs:   . Film/video editor (Medical):   Marland Kitchen Lack of Transportation (Non-Medical):   Physical Activity:   . Days of Exercise per Week:   . Minutes of Exercise per Session:   Stress:   . Feeling of Stress :  Social Connections:   . Frequency of Communication with Friends and Family:   . Frequency of Social Gatherings with Friends and Family:   . Attends Religious Services:   . Active Member of Clubs or Organizations:   . Attends Archivist Meetings:   Marland Kitchen Marital Status:   Intimate Partner Violence:   . Fear of Current or Ex-Partner:   . Emotionally Abused:   Marland Kitchen Physically Abused:   . Sexually Abused:     Family History  Problem Relation Age of Onset  . Coronary artery disease Unknown   . Diabetes type II Unknown     Past Surgical History:  Procedure Laterality Date  . ESOPHAGOGASTRODUODENOSCOPY N/A 04/19/2017   Procedure: ESOPHAGOGASTRODUODENOSCOPY (EGD);  Surgeon: Doran Stabler, MD;  Location:  [place];  Service: Gastroenterology;  Laterality: N/A;  . IR PARACENTESIS  04/17/2017  . NO PAST SURGERIES      ROS: Review of Systems Negative except as stated above  PHYSICAL EXAM: BP 118/85   Pulse (!) 108   Temp (!) 97 F (36.1 C)   Resp 16   Wt 152 lb 3.2 oz (69 kg)   SpO2 97%   BMI 23.84 kg/m   Wt Readings from Last 3 Encounters:  07/10/19 152 lb 3.2 oz (69 kg)  02/06/19 151 lb 3.2 oz (68.6 kg)  03/19/18 153 lb 6.4 oz (69.6 kg)    Physical Exam  General appearance - alert, well appearing, and in no distress Mental status - normal mood, behavior, speech, dress, motor activity, and thought processes Neck - supple, no significant adenopathy Chest - clear to auscultation, no wheezes, rales or rhonchi, symmetric air entry Heart -mild tachycardia, regular rhythm, normal S1, S2, no murmurs, rubs, clicks or gallops Abdomen - soft, nontender, nondistended, no masses or organomegaly Extremities - peripheral pulses normal, no pedal edema, no clubbing or cyanosis  CMP Latest Ref Rng & Units 02/06/2019 10/19/2017 04/30/2017  Glucose 65 - 99 mg/dL 88 100(H) 87  BUN 6 - 24 mg/dL 5(L) 10 14  Creatinine 0.76 - 1.27 mg/dL 0.82 0.83 0.74(L)  Sodium 134 - 144 mmol/L 137 139 143  Potassium 3.5 - 5.2 mmol/L 4.5 4.1 3.9  Chloride 96 - 106 mmol/L 98 98 100  CO2 20 - 29 mmol/L 26 24 25   Calcium 8.7 - 10.2 mg/dL 9.5 9.3 8.8  Total Protein 6.0 - 8.5 g/dL 7.5 7.2 7.6  Total Bilirubin 0.0 - 1.2 mg/dL 0.4 0.5 0.4  Alkaline Phos 39 - 117 IU/L 81 69 168(H)  AST 0 - 40 IU/L 12 11 70(H)  ALT 0 - 44 IU/L 8 8 29    Lipid Panel     Component Value Date/Time   CHOL 287 (H) 02/06/2019 1131   TRIG 181 (H) 02/06/2019 1131   HDL 39 (L) 02/06/2019 1131   CHOLHDL 7.4 (H) 02/06/2019 1131   CHOLHDL 3.6 10/20/2010 0320   VLDL 15 10/20/2010 0320   LDLCALC 213 (H) 02/06/2019 1131    CBC    Component Value Date/Time   WBC 13.4 (H) 02/06/2019 1131   WBC CANCELED 02/06/2019 1131   WBC 12.8 (H)  04/20/2017 0421   RBC 5.57 02/06/2019 1131   RBC 3.78 (L) 04/20/2017 0421   HGB 16.3 02/06/2019 1131   HCT 48.3 02/06/2019 1131   PLT 306 02/06/2019 1131   MCV 87 02/06/2019 1131   MCH 29.3 02/06/2019 1131   MCH 29.4 04/20/2017 0421   MCHC 33.7 02/06/2019 1131   MCHC  31.5 04/20/2017 0421   RDW 13.3 02/06/2019 1131   LYMPHSABS CANCELED 02/06/2019 1131   MONOABS 1.9 (H) 06/25/2015 0043   EOSABS CANCELED 02/06/2019 1131   BASOSABS CANCELED 02/06/2019 1131    ASSESSMENT AND PLAN:  1. Chronic insomnia Continue Ambien and trazodone - traZODone (DESYREL) 50 MG tablet; TAKE ONE TABLET BY MOUTH EVERY NIGHT AT BEDTIME AS NEEDED FOR SLEEP  Dispense: 30 tablet; Refill: 6  2. Tobacco dependence Commended him on cutting back.  Encouraged him to consider using the patches to help decrease his cravings and help him to quit.  Less than 5 minutes spent on counseling.  3. Hyperlipidemia, unspecified hyperlipidemia type Patient declined lipid profile check.  He prefers to do it on next visit - atorvastatin (LIPITOR) 10 MG tablet; Take 1 tablet (10 mg total) by mouth daily.  Dispense: 30 tablet; Refill: 6  4. Elevated blood pressure reading Diastolic blood pressure still above goal.  [facility] that has a patient blood pressure [facility].  He will check his blood pressure twice a month and record the readings.  I told him the goal is to be less than 130/80 and that normal blood pressure is 120/80 or lower.  Continue to limit salt in the foods  5. Leukocytosis, unspecified type Discussed this with the patient.  We will continue to monitor and will plan to check CBC with differential on follow-up visit  6. Alcoholic cirrhosis of liver without ascites ([place]) Patient has done very well.  We will continue to monitor. Will need US liver this yr to screen for [facility].    Patient was given the opportunity to ask questions.  Patient verbalized understanding of the plan and was able to  repeat key elements of the plan.   No orders of the defined types were placed in this encounter.    Requested Prescriptions   Signed Prescriptions Disp Refills  . atorvastatin (LIPITOR) 10 MG tablet 30 tablet 6    Sig: Take 1 tablet (10 mg total) by mouth daily.  . traZODone (DESYREL) 50 MG tablet 30 tablet 6    Sig: TAKE ONE TABLET BY MOUTH EVERY NIGHT AT BEDTIME AS NEEDED FOR SLEEP    Return in about 5 months (around 12/10/2019).  [name], MD, FACP

## 2019-07-10 NOTE — Patient Instructions (Signed)
Try to check your blood pressure at least twice a month.  Normal blood pressure is 120/80 or lower.  If you find that you are running consistently higher than this please give me a call.  Try to limit the salt in the foods as much as possible.

## 2019-08-07 ENCOUNTER — Telehealth: Payer: Self-pay

## 2019-08-07 DIAGNOSIS — F5104 Psychophysiologic insomnia: Secondary | ICD-10-CM

## 2019-08-07 NOTE — Telephone Encounter (Signed)
Patient is needing a refill on his Ambien sent to [facility]

## 2019-08-08 MED ORDER — ZOLPIDEM TARTRATE ER 6.25 MG PO TBCR: EXTENDED_RELEASE_TABLET | ORAL | 0 refills | Status: DC

## 2019-08-08 NOTE — Addendum Note (Signed)
Addended byCharlott Rakes on: 08/08/2019 03:22 PM   Modules accepted: Orders

## 2019-08-08 NOTE — Telephone Encounter (Signed)
Will forward to covering provider.

## 2019-08-08 NOTE — Telephone Encounter (Signed)
Refilled

## 2019-09-03 ENCOUNTER — Other Ambulatory Visit: Payer: Self-pay | Admitting: Family Medicine

## 2019-09-03 DIAGNOSIS — F5104 Psychophysiologic insomnia: Secondary | ICD-10-CM

## 2019-09-03 NOTE — Telephone Encounter (Signed)
Patient is requesting refill on Ambien.

## 2019-09-03 NOTE — Telephone Encounter (Signed)
Requested medication (s) are due for refill today: yes  Requested medication (s) are on the active medication list: yes  Last refill:  08/08/2019  Future visit scheduled: no  Notes to clinic:  this refill cannot be delegated   Requested Prescriptions  Pending Prescriptions Disp Refills   zolpidem (AMBIEN CR) 6.25 MG CR tablet [Pharmacy Med Name: ZOLPIDEM ER 6.25MG  TABLETS] 30 tablet     Sig: TAKE 1 TABLET BY MOUTH EVERY NIGHT AT BEDTIME      Not Delegated - Psychiatry:  Anxiolytics/Hypnotics Failed - 09/03/2019 11:13 AM      Failed - This refill cannot be delegated      Failed - Urine Drug Screen completed in last 360 days.      Passed - Valid encounter within last 6 months    Recent Outpatient Visits           1 month ago Chronic insomnia   [place], MD   6 months ago Annual physical exam   [name], MD   1 year ago Chronic insomnia   [place], Deborah B, MD   1 year ago Hyperlipidemia, unspecified hyperlipidemia type   [place], MD   1 year ago Hyperlipidemia, unspecified hyperlipidemia type   [place], [place], [place]

## 2019-09-04 NOTE — Telephone Encounter (Signed)
RF sent on Ambien.

## 2019-09-04 NOTE — Telephone Encounter (Signed)
PLEASE REFILL IF APPROPRIATE.

## 2019-11-05 ENCOUNTER — Other Ambulatory Visit: Payer: Self-pay | Admitting: Internal Medicine

## 2019-11-05 DIAGNOSIS — F5104 Psychophysiologic insomnia: Secondary | ICD-10-CM

## 2019-11-05 NOTE — Telephone Encounter (Signed)
Patient is going out of town tomorrow and would like zolpidem (AMBIEN CR) 6.25 MG CR tablet  sent in today, informed patient please allow 48 to 72 hour turn around time.  Patient did stat he contacted phramacy first   [place], [address] Phone:  [phone]  Fax:  [phone]

## 2019-11-05 NOTE — Telephone Encounter (Signed)
Requested medication (s) are due for refill today:  yes  Requested medication (s) are on the active medication list:yes   Last refill: 10/07/2019  Future visit scheduled: no  Notes to clinic:  this refill cannot be delegated    Requested Prescriptions  Pending Prescriptions Disp Refills   zolpidem (AMBIEN CR) 6.25 MG CR tablet [Pharmacy Med Name: ZOLPIDEM ER 6.25MG  TABLETS] 30 tablet     Sig: TAKE 1 TABLET BY MOUTH EVERY NIGHT AT BEDTIME. EACH PRESCRIPTION TO LAST 1 MONTH      Not Delegated - Psychiatry:  Anxiolytics/Hypnotics Failed - 11/05/2019 10:40 AM      Failed - This refill cannot be delegated      Failed - Urine Drug Screen completed in last 360 days.      Passed - Valid encounter within last 6 months    Recent Outpatient Visits           3 months ago Chronic insomnia   [place], MD   9 months ago Annual physical exam   [name], MD   1 year ago Chronic insomnia   [place], Deborah B, MD   1 year ago Hyperlipidemia, unspecified hyperlipidemia type   [place], MD   1 year ago Hyperlipidemia, unspecified hyperlipidemia type   [place], [place], [place]

## 2019-11-14 ENCOUNTER — Other Ambulatory Visit: Payer: Self-pay | Admitting: Internal Medicine

## 2019-11-14 DIAGNOSIS — N529 Male erectile dysfunction, unspecified: Secondary | ICD-10-CM

## 2019-12-30 ENCOUNTER — Other Ambulatory Visit: Payer: Self-pay

## 2019-12-30 ENCOUNTER — Ambulatory Visit: Payer: Self-pay | Attending: Internal Medicine | Admitting: Internal Medicine

## 2019-12-30 DIAGNOSIS — R030 Elevated blood-pressure reading, without diagnosis of hypertension: Secondary | ICD-10-CM

## 2019-12-30 DIAGNOSIS — Z2821 Immunization not carried out because of patient refusal: Secondary | ICD-10-CM

## 2019-12-30 DIAGNOSIS — E785 Hyperlipidemia, unspecified: Secondary | ICD-10-CM

## 2019-12-30 DIAGNOSIS — K7030 Alcoholic cirrhosis of liver without ascites: Secondary | ICD-10-CM

## 2019-12-30 DIAGNOSIS — F419 Anxiety disorder, unspecified: Secondary | ICD-10-CM

## 2019-12-30 DIAGNOSIS — F5104 Psychophysiologic insomnia: Secondary | ICD-10-CM

## 2019-12-30 DIAGNOSIS — Z125 Encounter for screening for malignant neoplasm of prostate: Secondary | ICD-10-CM

## 2019-12-30 MED ORDER — RAMELTEON 8 MG PO TABS: 8.0000 mg | ORAL_TABLET | Freq: Every day | ORAL | 0 refills | Status: DC

## 2019-12-30 NOTE — Progress Notes (Signed)
Virtual Visit via Telephone Note  I connected with [name] on 12/30/19 at 8:46 a.m by telephone and verified that I am speaking with the correct person using two identifiers.  Location: Patient: home Provider: office   I discussed the limitations, risks, security and privacy concerns of performing an evaluation and management service by telephone and the availability of in person appointments. I also discussed with the patient that there may be a patient responsible charge related to this service. The patient expressed understanding and agreed to proceed.   History of Present Illness: Pt with hx of ETOH cirrhosis(resolved), tobacco dep,erectile dysfunction,chronicinsomnia, anxiety/depressionand hyperlipidemia.  Last seen 07/2019.  Lately he has been experiencing some anxiety.  Some recent life stresses may be contributing - mom was sick.  Reports his younger brother went through same thing 2 yrs ago and his doctor prescribe Clonazepam for him which helped. Wonders if same will help him. When asked to describe his anxiety, patient describe it as being irritable and feeling like he cannot relax or calm down.  Feeling comes and goes throughout the day and can last couple of hours. -sleeping still not good.  Wants to know about changing to Ambien 10 mg.  Feels Trazodone causes him to have bad dreams.  BP:  BP elevated on last visit.  Checks BP at [facility] and [facility] since then.  Reports reading have been good in the 120s/70s  HL:  Compliant with Atorvastatin  ETOH cirrhosis:  Has not touch ETOH in almost 3 yrs.  He is due for [facility] screening with liver ultrasound.  However he currently does not have insurance.   HM:  Declines flu shot Observations/Objective: GAD 7 : Generalized Anxiety Score 12/30/2019 03/19/2018 01/17/2018 10/09/2017  Nervous, Anxious, on Edge 0 0 0 0  Control/stop worrying 1 1 0 1  Worry too much - different things 1 1 1 1   Trouble relaxing 0 1 1 1    Restless 0 1 1 1   Easily annoyed or irritable 1 1 1 1   Afraid - awful might happen 0 0 0 1  Total GAD 7 Score 3 5 4 6   Anxiety Difficulty - - - -    Depression screen [facility] 2/9 12/30/2019 03/19/2018 01/17/2018  Decreased Interest 0 0 0  Down, Depressed, Hopeless 0 1 0  PHQ - 2 Score 0 1 0  Altered sleeping - - 2  Tired, decreased energy - - 1  Change in appetite - - 0  Feeling bad or failure about yourself  - - 0  Trouble concentrating - - 0  Moving slowly or fidgety/restless - - 0  Suicidal thoughts - - 0  PHQ-9 Score - - 3  Difficult doing work/chores - - -  Some recent data might be hidden    Assessment and Plan: 1. Chronic insomnia -Discussed with patient that the Ambien CR is controlled release and would probably work better than 10 mg of regular Ambien.  We discussed other options including trying him with Rozerem but patient informed that it is expensive.  He wanted to try it for 5 days to see if it works better for him than the Ambien.  I sent a prescription to his pharmacy for the Rozerem.  He will let me know whether it works better for him and whether he is able to afford it.  If he is unable to afford it then he will continue with the Ambien.  I recommend stopping the trazodone if he feels it causes bad  dreams. - ramelteon (ROZEREM) 8 MG tablet; Take 1 tablet (8 mg total) by mouth at bedtime. Hold off on taking Ambien  Dispense: 5 tablet; Refill: 0  2. Anxiety Current gad score is 3. I discussed management of anxiety which can include CBT and medication.  I recommend giving a trial of CBT and will have our LCSW follow-up with him.  In regards to medication I told him that if he would like we can try him on something like Zoloft or hydroxyzine but that I do not routinely prescribe benzodiazepines as first-line for anxiety as medications can be habit-forming.  He wondered whether the CBT will work for him stating that it did not work for his brother and there very much alike.  I  told him that it is worth a try.  3. Elevated blood pressure reading Reports that blood pressure readings have been good since last visit.  4. Hyperlipidemia, unspecified hyperlipidemia type Continue atorvastatin - CBC With Differential; Future - Lipid panel; Future - Comprehensive metabolic panel; Future  5. Alcoholic cirrhosis of liver without ascites ([place]) Due for screening for [place].  His orange card has expired.  I recommend that he pick up forms when he comes in to have his labs done to reapply for the orange card/cone discount.  Once approved we can go ahead and order the liver ultrasound - CBC With Differential; Future - Comprehensive metabolic panel; Future  6. Prostate cancer screening - PSA; Future  7. Influenza vaccination declined This was recommended.  Patient declined.   Follow Up Instructions: 3 mths   I discussed the assessment and treatment plan with the patient. The patient was provided an opportunity to ask questions and all were answered. The patient agreed with the plan and demonstrated an understanding of the instructions.   The patient was advised to call back or seek an in-person evaluation if the symptoms worsen or if the condition fails to improve as anticipated.  I provided 22 minutes of non-face-to-face time during this encounter.   [name], MD

## 2020-01-05 ENCOUNTER — Other Ambulatory Visit: Payer: Self-pay | Admitting: Internal Medicine

## 2020-01-05 DIAGNOSIS — F5104 Psychophysiologic insomnia: Secondary | ICD-10-CM

## 2020-01-05 NOTE — Telephone Encounter (Signed)
Requested medication (s) are due for refill today- yes  Requested medication (s) are on the active medication list -yes  Future visit scheduled -no  Last refill: 11/08/19 #30 1 RF  Notes to clinic: Request RF- non delegated Rx  Requested Prescriptions  Pending Prescriptions Disp Refills   zolpidem (AMBIEN CR) 6.25 MG CR tablet [Pharmacy Med Name: ZOLPIDEM ER 6.25MG  TABLETS] 30 tablet     Sig: TAKE 1 TABLET BY MOUTH EVERY NIGHT AT BEDTIME. EACH PRESCRIPTION TO LAST 1 MONTH      Not Delegated - Psychiatry:  Anxiolytics/Hypnotics Failed - 01/05/2020 11:49 AM      Failed - This refill cannot be delegated      Failed - Urine Drug Screen completed in last 360 days      Passed - Valid encounter within last 6 months    Recent Outpatient Visits           6 days ago Chronic insomnia   [place] [name], MD   5 months ago Chronic insomnia   [place] [name], MD   11 months ago Annual physical exam   [place] [name], MD   1 year ago Chronic insomnia   [place] [name], MD   1 year ago Hyperlipidemia, unspecified hyperlipidemia type   [place], MD                  Requested Prescriptions  Pending Prescriptions Disp Refills   zolpidem (AMBIEN CR) 6.25 MG CR tablet [Pharmacy Med Name: ZOLPIDEM ER 6.25MG  TABLETS] 30 tablet     Sig: TAKE 1 TABLET BY MOUTH EVERY NIGHT AT BEDTIME. EACH PRESCRIPTION TO LAST 1 MONTH      Not Delegated - Psychiatry:  Anxiolytics/Hypnotics Failed - 01/05/2020 11:49 AM      Failed - This refill cannot be delegated      Failed - Urine Drug Screen completed in last 360 days      Passed - Valid encounter within last 6 months    Recent Outpatient Visits           6 days ago Chronic insomnia   [place] [name], MD   5 months ago Chronic insomnia   [place] [name], MD   11 months ago Annual physical exam   [place], Deborah B, MD   1 year ago Chronic insomnia   [place], MD   1 year ago Hyperlipidemia, unspecified hyperlipidemia type   [place], Deborah B, MD

## 2020-01-08 ENCOUNTER — Ambulatory Visit: Payer: Self-pay | Attending: Internal Medicine | Admitting: Licensed Clinical Social Worker

## 2020-01-08 DIAGNOSIS — F418 Other specified anxiety disorders: Secondary | ICD-10-CM

## 2020-01-08 DIAGNOSIS — F411 Generalized anxiety disorder: Secondary | ICD-10-CM

## 2020-01-08 NOTE — Progress Notes (Signed)
Integrated Behavioral Health Initial Visit  MRN: [id number] Name: [name]  Number of [place] Clinician visits:: 1/6 Session Start time: 10:05  Session End time: 10:30 Total time: 25  Type of Service: [place] Interpretor:No.  Strengths: Willing to open up and answer questions about past and present life occurences.   Warm Hand Off Completed. From Dr. Durenda Age referral      SUBJECTIVE: [name] is a 54 y.o. male accompanied by self over the phone. Two identifiers were collected. Patient was referred by Dr. Wynetta Emery for symptoms of anxiety/depression. Patient reports the following symptoms/concerns: having difficulty sleeping, feeling depressed, easily irritated, and daily feelings of anxiety Duration of problem: within the past 6-8 months; Severity of problem: moderate  OBJECTIVE: Mood: Anxious and Affect: Appropriate Risk of harm to self or others: No plan to harm self or others  LIFE CONTEXT: Family and Social: Pt reports being close with family, particularly with his  Brother and 54 yr old son. 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 Self-Care: Pt reports going camping with his son, and enjoying the outdoors. Life Changes: Pt reports onset of depression/anxiety within the past 6-8 months.  GOALS ADDRESSED: Patient will: 1. Reduce symptoms of: agitation, anxiety, depression and stress by beginning anti-anxiety medication with Dr. Durenda Age approval. 2. Increase knowledge and/or ability of: coping skills and stress reduction by utilizing Pt's preferred self-care strategies when feeling stressed or anxious. 3. Demonstrate ability to: Increase healthy adjustment to current life circumstances and Increase adequate support systems for patient/family by checking in with [facility] Intern and Dr. Wynetta Emery on progress of  medication's effects.  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 Pt reports feeling anxious nearly everyday with no known cause, with symptoms of depression, irritation, difficulty relaxing/sleeping, and racing thoughts. Pt reports talking to his brother everyday and being more comfortable speaking with him than a therapist. Pt is interested in beginning anti-anxiety medication and checking in with Social Workers to update progress, but would rather not begin therapy at this time.    Patient may benefit from seeing a psychiatrist for medication management, or begging medicine and checking in with PCP and SW for progress updates.  PLAN: 1. Follow up with behavioral health clinician on : as needed basis 2. Behavioral recommendations: practice self-care coping skills 3. BH refferral(s): [place] (In Clinic) and [facility] Intern

## 2020-01-12 ENCOUNTER — Other Ambulatory Visit: Payer: Self-pay

## 2020-01-12 ENCOUNTER — Telehealth: Payer: Self-pay | Admitting: Internal Medicine

## 2020-01-12 MED ORDER — SERTRALINE HCL 50 MG PO TABS: ORAL_TABLET | ORAL | 3 refills | Status: DC

## 2020-01-12 NOTE — Telephone Encounter (Signed)
-----  Message from [name] sent at 01/08/2020 12:07 PM EST ----- Regarding: Treatment Plan Dr. Wynetta Emery, I met with Pt to assess interest in therapy, but Pt was not interested in therapy at this time. Pt requested anti-anxiety medication. Potential referral to psychiatry for medication management or other options for medication management at your recommendation. Thanks!

## 2020-01-12 NOTE — Telephone Encounter (Signed)
Pt aware and voiced understanding of instructions

## 2020-02-02 ENCOUNTER — Other Ambulatory Visit: Payer: Self-pay | Admitting: Internal Medicine

## 2020-02-02 DIAGNOSIS — F5104 Psychophysiologic insomnia: Secondary | ICD-10-CM

## 2020-02-04 ENCOUNTER — Other Ambulatory Visit: Payer: Self-pay | Admitting: Internal Medicine

## 2020-02-04 DIAGNOSIS — F5104 Psychophysiologic insomnia: Secondary | ICD-10-CM

## 2020-02-04 MED ORDER — ZOLPIDEM TARTRATE ER 6.25 MG PO TBCR: EXTENDED_RELEASE_TABLET | ORAL | 1 refills | Status: DC

## 2020-02-04 NOTE — Telephone Encounter (Signed)
Medication Refill - Medication: zolpidem (AMBIEN CR) 6.25 MG CR tablet    Has the patient contacted their pharmacy? No. (Agent: If no, request that the patient contact the pharmacy for the refill.) (Agent: If yes, when and what did the pharmacy advise?)  Preferred Pharmacy (with phone number or street name):  [facility] [place], [place] - [address] AT [address] Phone:  [phone]  Fax:  [phone]       Agent: Please be advised that RX refills may take up to 3 business days. We ask that you follow-up with your pharmacy.

## 2020-02-04 NOTE — Telephone Encounter (Signed)
Requested medication (s) are due for refill today: yes   Requested medication (s) are on the active medication list: yes   Last refill:  01/05/2020  Future visit scheduled: no  Notes to clinic:  this refill cannot be delegated    Requested Prescriptions  Pending Prescriptions Disp Refills   zolpidem (AMBIEN CR) 6.25 MG CR tablet 30 tablet 0    Sig: TAKE 1 TABLET BY MOUTH EVERY NIGHT AT BEDTIME, EACH PRESCRIPTION TO LAST 1 MONTH      Not Delegated - Psychiatry:  Anxiolytics/Hypnotics Failed - 02/04/2020  9:49 AM      Failed - This refill cannot be delegated      Failed - Urine Drug Screen completed in last 360 days      Passed - Valid encounter within last 6 months    Recent Outpatient Visits           1 month ago Chronic insomnia   [place] [name], MD   6 months ago Chronic insomnia   [place] [name], MD   12 months ago Annual physical exam   [place] [name], MD   1 year ago Chronic insomnia   [place] [name], MD   1 year ago Hyperlipidemia, unspecified hyperlipidemia type   [place], Deborah B, MD

## 2020-02-16 ENCOUNTER — Other Ambulatory Visit: Payer: Self-pay | Admitting: Internal Medicine

## 2020-02-16 DIAGNOSIS — N529 Male erectile dysfunction, unspecified: Secondary | ICD-10-CM

## 2020-03-31 ENCOUNTER — Other Ambulatory Visit: Payer: Self-pay | Admitting: Internal Medicine

## 2020-03-31 DIAGNOSIS — F5104 Psychophysiologic insomnia: Secondary | ICD-10-CM

## 2020-03-31 NOTE — Telephone Encounter (Signed)
Non delegated refill request for Ambien

## 2020-03-31 NOTE — Telephone Encounter (Signed)
Pt states the pharmacy told him he has to call.  I advised pt this is due 2/08.  He said he wasn't sure when he should call for refill.  I advised 3 day business is good. Pt verbalized understanding.

## 2020-04-12 ENCOUNTER — Other Ambulatory Visit: Payer: Self-pay | Admitting: Internal Medicine

## 2020-04-12 DIAGNOSIS — F5104 Psychophysiologic insomnia: Secondary | ICD-10-CM

## 2020-04-14 ENCOUNTER — Other Ambulatory Visit: Payer: Self-pay | Admitting: Internal Medicine

## 2020-04-14 DIAGNOSIS — F5104 Psychophysiologic insomnia: Secondary | ICD-10-CM

## 2020-04-14 NOTE — Telephone Encounter (Signed)
Requested medication (s) are due for refill today: Yes  Requested medication (s) are on the active medication list: Yes  Last refill:  04/02/20  Future visit scheduled: No  Notes to clinic:  Unable to refill per protocol, cannot delegate.      Requested Prescriptions  Pending Prescriptions Disp Refills   zolpidem (AMBIEN CR) 6.25 MG CR tablet [Pharmacy Med Name: ZOLPIDEM TART ER 6.25 MG TAB] 30 tablet     Sig: TAKE 1 TABLET BY MOUTH EVERYDAY AT BEDTIME. EACH PRESCRIPTION TO LAST 1 MONTH      Not Delegated - Psychiatry:  Anxiolytics/Hypnotics Failed - 04/14/2020  4:41 PM      Failed - This refill cannot be delegated      Failed - Urine Drug Screen completed in last 360 days      Passed - Valid encounter within last 6 months    Recent Outpatient Visits           3 months ago Chronic insomnia   [place] [name], MD   9 months ago Chronic insomnia   [place] [name], MD   1 year ago Annual physical exam   [place] [name], MD   1 year ago Chronic insomnia   [place], MD   2 years ago Hyperlipidemia, unspecified hyperlipidemia type   [place], Deborah B, MD

## 2020-05-08 ENCOUNTER — Other Ambulatory Visit: Payer: Self-pay | Admitting: Internal Medicine

## 2020-05-08 DIAGNOSIS — N529 Male erectile dysfunction, unspecified: Secondary | ICD-10-CM

## 2020-05-08 NOTE — Telephone Encounter (Signed)
Requested Prescriptions  Pending Prescriptions Disp Refills  . sildenafil (VIAGRA) 100 MG tablet [Pharmacy Med Name: SILDENAFIL 100 MG TABLET] 30 tablet 0    Sig: TAKE ONE-HALF TO ONE (0.5-1) TABLET BY MOUTH DAILY AS NEEDED     Urology: Erectile Dysfunction Agents Passed - 05/08/2020  4:02 PM      Passed - Last BP in normal range    BP Readings from Last 1 Encounters:  07/10/19 118/85         Passed - Valid encounter within last 12 months    Recent Outpatient Visits          4 months ago Chronic insomnia   [place] [name], MD   10 months ago Chronic insomnia   [place] [name], MD   1 year ago Annual physical exam   [place] [name], MD   1 year ago Chronic insomnia   [place], MD   2 years ago Hyperlipidemia, unspecified hyperlipidemia type   [place], Deborah B, MD

## 2020-05-31 ENCOUNTER — Other Ambulatory Visit: Payer: Self-pay | Admitting: Internal Medicine

## 2020-05-31 DIAGNOSIS — F5104 Psychophysiologic insomnia: Secondary | ICD-10-CM

## 2020-06-03 ENCOUNTER — Other Ambulatory Visit: Payer: Self-pay | Admitting: Internal Medicine

## 2020-06-03 DIAGNOSIS — F5104 Psychophysiologic insomnia: Secondary | ICD-10-CM

## 2020-06-03 NOTE — Telephone Encounter (Signed)
Requested medication (s) are due for refill today: yes  Requested medication (s) are on the active medication list: yes  Last refill: 05/04/2020  Future visit scheduled:  no  Notes to clinic:  this refill cannot be delegated    Requested Prescriptions  Pending Prescriptions Disp Refills   zolpidem (AMBIEN CR) 6.25 MG CR tablet [Pharmacy Med Name: ZOLPIDEM ER 6.25MG  TABLETS] 30 tablet     Sig: TAKE 1 TABLET BY MOUTH EVERY NIGHT AT BEDTIME      Not Delegated - Psychiatry:  Anxiolytics/Hypnotics Failed - 06/03/2020  9:36 AM      Failed - This refill cannot be delegated      Failed - Urine Drug Screen completed in last 360 days      Passed - Valid encounter within last 6 months    Recent Outpatient Visits           5 months ago Chronic insomnia   [place] [name], MD   10 months ago Chronic insomnia   [place] [name], MD   1 year ago Annual physical exam   [place] [name], MD   1 year ago Chronic insomnia   [place], MD   2 years ago Hyperlipidemia, unspecified hyperlipidemia type   [place], Deborah B, MD

## 2020-06-04 NOTE — Telephone Encounter (Signed)
Please contact pt and schedule  

## 2020-07-01 ENCOUNTER — Other Ambulatory Visit: Payer: Self-pay | Admitting: Internal Medicine

## 2020-07-01 NOTE — Telephone Encounter (Signed)
Courtesy refill. Patient needs an office visit. Requested Prescriptions  Pending Prescriptions Disp Refills  . sertraline (ZOLOFT) 50 MG tablet [Pharmacy Med Name: SERTRALINE 50MG  TABLETS] 30 tablet 0    Sig: TAKE 1/2 TABLET BY MOUTH EVERY DAY FOR 3 WEEKS THEN 1 EVERY DAY     Psychiatry:  Antidepressants - SSRI Failed - 07/01/2020  3:44 AM      Failed - Valid encounter within last 6 months    Recent Outpatient Visits          6 months ago Chronic insomnia   [place] [name], MD   11 months ago Chronic insomnia   [place] [name], MD   1 year ago Annual physical exam   [place], Deborah B, MD   1 year ago Chronic insomnia   [place], MD   2 years ago Hyperlipidemia, unspecified hyperlipidemia type   [place], MD             Passed - Completed PHQ-2 or PHQ-9 in the last 360 days

## 2020-07-02 ENCOUNTER — Other Ambulatory Visit: Payer: Self-pay | Admitting: Internal Medicine

## 2020-07-02 DIAGNOSIS — F5104 Psychophysiologic insomnia: Secondary | ICD-10-CM

## 2020-07-02 NOTE — Telephone Encounter (Signed)
Requested medication (s) are due for refill today: yes  Requested medication (s) are on the active medication list: yes  Last refill:  06/03/20 #30 0 refills  Future visit scheduled: no  Notes to clinic:  not delegated per protocol, no valid encounter within 6 months. Called patient to schedule appt. No answer, LMTCB.     Requested Prescriptions  Pending Prescriptions Disp Refills   zolpidem (AMBIEN CR) 6.25 MG CR tablet [Pharmacy Med Name: ZOLPIDEM ER 6.25MG  TABLETS] 30 tablet     Sig: TAKE 1 TABLET BY MOUTH EVERY NIGHT AT BEDTIME      Not Delegated - Psychiatry:  Anxiolytics/Hypnotics Failed - 07/02/2020  6:01 PM      Failed - This refill cannot be delegated      Failed - Urine Drug Screen completed in last 360 days      Failed - Valid encounter within last 6 months    Recent Outpatient Visits           6 months ago Chronic insomnia   [place] [name], MD   11 months ago Chronic insomnia   [place] [name], MD   1 year ago Annual physical exam   [place] [name], MD   1 year ago Chronic insomnia   [place], MD   2 years ago Hyperlipidemia, unspecified hyperlipidemia type   [place], Deborah B, MD

## 2020-07-07 ENCOUNTER — Encounter: Payer: Self-pay | Admitting: Family Medicine

## 2020-07-07 ENCOUNTER — Ambulatory Visit: Payer: Self-pay | Attending: Family Medicine | Admitting: Family Medicine

## 2020-07-07 VITALS — BP 101/65 | HR 83 | Ht 67.0 in | Wt 147.6 lb

## 2020-07-07 DIAGNOSIS — F5104 Psychophysiologic insomnia: Secondary | ICD-10-CM

## 2020-07-07 DIAGNOSIS — E785 Hyperlipidemia, unspecified: Secondary | ICD-10-CM

## 2020-07-07 MED ORDER — ZOLPIDEM TARTRATE ER 6.25 MG PO TBCR: EXTENDED_RELEASE_TABLET | ORAL | 1 refills | Status: DC

## 2020-07-07 NOTE — Patient Instructions (Signed)

## 2020-07-07 NOTE — Progress Notes (Signed)
Refill on Ambien. 

## 2020-07-07 NOTE — Progress Notes (Signed)
Subjective:  Patient ID: [name], male    DOB: 1965/08/14  Age: 55 y.o. MRN: [id number]  CC: Medication Refill   HPI [name] is a 55 year old male with a history of hyperlipidemia, insomnia, tobacco abuse who presents today requesting refills.  Interval History: Requests refill of Ambien. Not taking Trazodone as it does not work; he was advised by PCP to take Ambien and trazodone he states. Does not take Lipitor daily as he sometimes forgets.  Not compliant with a low-cholesterol diet and does not exercise regularly. He was last seen by PCP via telemedicine visit in 12/30/2019. He is due for blood work as his last set of labs were in 01/2019. Past Medical History:  Diagnosis Date  . Alcoholism ([place])   . Cirrhosis ([place])   . Hyperlipemia     Past Surgical History:  Procedure Laterality Date  . ESOPHAGOGASTRODUODENOSCOPY N/A 04/19/2017   Procedure: ESOPHAGOGASTRODUODENOSCOPY (EGD);  Surgeon: Doran Stabler, MD;  Location: [facility];  Service: Gastroenterology;  Laterality: N/A;  . IR PARACENTESIS  04/17/2017  . NO PAST SURGERIES      Family History  Problem Relation Age of Onset  . Coronary artery disease Unknown   . Diabetes type II Unknown     No Known Allergies  Outpatient Medications Prior to Visit  Medication Sig Dispense Refill  . atorvastatin (LIPITOR) 10 MG tablet Take 1 tablet (10 mg total) by mouth daily. 30 tablet 6  . sildenafil (VIAGRA) 100 MG tablet TAKE ONE-HALF TO ONE (0.5-1) TABLET BY MOUTH DAILY AS NEEDED 30 tablet 0  . zolpidem (AMBIEN CR) 6.25 MG CR tablet TAKE 1 TABLET BY MOUTH EVERY NIGHT AT BEDTIME 30 tablet 0  . nicotine (NICODERM CQ - DOSED IN MG/24 HOURS) 21 mg/24hr patch Place 1 patch (21 mg total) onto the skin daily. (Patient not taking: Reported on 07/07/2020) 28 patch 1  . sertraline (ZOLOFT) 50 MG tablet TAKE 1/2 TABLET BY MOUTH EVERY DAY FOR 3 WEEKS THEN 1 EVERY DAY (Patient not taking: Reported on 07/07/2020) 30  tablet 0  . traZODone (DESYREL) 50 MG tablet TAKE 1 TABLET BY MOUTH EVERY NIGHT AT BEDTIME AS NEEDED FOR SLEEP (Patient not taking: Reported on 07/07/2020) 30 tablet 3   No facility-administered medications prior to visit.     ROS Review of Systems  Constitutional: Negative for activity change and appetite change.  HENT: Negative for sinus pressure and sore throat.   Eyes: Negative for visual disturbance.  Respiratory: Negative for cough, chest tightness and shortness of breath.   Cardiovascular: Negative for chest pain and leg swelling.  Gastrointestinal: Negative for abdominal distention, abdominal pain, constipation and diarrhea.  Endocrine: Negative.   Genitourinary: Negative for dysuria.  Musculoskeletal: Negative for joint swelling and myalgias.  Skin: Negative for rash.  Allergic/Immunologic: Negative.   Neurological: Negative for weakness, light-headedness and numbness.  Psychiatric/Behavioral: Positive for sleep disturbance. Negative for dysphoric mood and suicidal ideas.    Objective:  BP 101/65   Pulse 83   Ht _0  (1.702 m)   Wt 147 lb 9.6 oz (67 kg)   SpO2 98%   BMI 23.12 kg/m   BP/Weight 07/07/2020 07/10/2019 26/94/8546  Systolic BP 270 350 093  Diastolic BP 65 85 84  Wt. (Lbs) 147.6 152.2 151.2  BMI 23.12 23.84 23.68      Physical Exam Constitutional:      Appearance: He is well-developed.  Neck:     Vascular: No JVD.  Cardiovascular:  Rate and Rhythm: Normal rate.     Heart sounds: Normal heart sounds. No murmur heard.   Pulmonary:     Effort: Pulmonary effort is normal.     Breath sounds: Normal breath sounds. No wheezing or rales.  Chest:     Chest wall: No tenderness.  Abdominal:     General: Bowel sounds are normal. There is no distension.     Palpations: Abdomen is soft. There is no mass.     Tenderness: There is no abdominal tenderness.  Musculoskeletal:        General: Normal range of motion.     Right lower leg: No edema.      Left lower leg: No edema.  Neurological:     Mental Status: He is alert and oriented to person, place, and time.  Psychiatric:        Mood and Affect: Mood normal.     CMP Latest Ref Rng & Units 02/06/2019 10/19/2017 04/30/2017  Glucose 65 - 99 mg/dL 88 100(H) 87  BUN 6 - 24 mg/dL 5(L) 10 14  Creatinine 0.76 - 1.27 mg/dL 0.82 0.83 0.74(L)  Sodium 134 - 144 mmol/L 137 139 143  Potassium 3.5 - 5.2 mmol/L 4.5 4.1 3.9  Chloride 96 - 106 mmol/L 98 98 100  CO2 20 - 29 mmol/L _0 Calcium 8.7 - 10.2 mg/dL 9.5 9.3 8.8  Total Protein 6.0 - 8.5 g/dL 7.5 7.2 7.6  Total Bilirubin 0.0 - 1.2 mg/dL 0.4 0.5 0.4  Alkaline Phos 39 - 117 IU/L 81 69 168(H)  AST 0 - 40 IU/L 12 11 70(H)  ALT 0 - 44 IU/L _1 Lipid Panel     Component Value Date/Time   CHOL 287 (H) 02/06/2019 1131   TRIG 181 (H) 02/06/2019 1131   HDL 39 (L) 02/06/2019 1131   CHOLHDL 7.4 (H) 02/06/2019 1131   CHOLHDL 3.6 10/20/2010 0320   VLDL 15 10/20/2010 0320   LDLCALC 213 (H) 02/06/2019 1131    CBC    Component Value Date/Time   WBC 13.4 (H) 02/06/2019 1131   WBC CANCELED 02/06/2019 1131   WBC 12.8 (H) 04/20/2017 0421   RBC 5.57 02/06/2019 1131   RBC 3.78 (L) 04/20/2017 0421   HGB 16.3 02/06/2019 1131   HCT 48.3 02/06/2019 1131   PLT 306 02/06/2019 1131   MCV 87 02/06/2019 1131   MCH 29.3 02/06/2019 1131   MCH 29.4 04/20/2017 0421   MCHC 33.7 02/06/2019 1131   MCHC 31.5 04/20/2017 0421   RDW 13.3 02/06/2019 1131   LYMPHSABS CANCELED 02/06/2019 1131   MONOABS 1.9 (H) 06/25/2015 0043   EOSABS CANCELED 02/06/2019 1131   BASOSABS CANCELED 02/06/2019 1131    Lab Results  Component Value Date   HGBA1C 5.1 10/23/2014    Assessment & Plan:  1. Chronic insomnia Uncontrolled He states trazodone is ineffective I have refilled Ambien He will schedule appointment with PCP to discuss further - zolpidem (AMBIEN CR) 6.25 MG CR tablet; TAKE 1 TABLET BY MOUTH EVERY NIGHT AT BEDTIME  Dispense: 30 tablet; Refill:  1  2. Hyperlipidemia, unspecified hyperlipidemia type Uncontrolled due to noncompliance We will check lipid panel We have discussed mechanisms to aid compliance including using timers Low-cholesterol diet - CMP14+EGFR; Future - Lipid panel; Future   Meds ordered this encounter  Medications  . zolpidem (AMBIEN CR) 6.25 MG CR tablet    Sig: TAKE 1 TABLET BY MOUTH EVERY NIGHT AT BEDTIME  Dispense:  30 tablet    Refill:  1    Follow-up: Return in about 1 month (around 08/07/2020) for PCP Dr. Wynetta Emery to discuss insomnia.       Charlott Rakes, MD, FAAFP. [facility] [place], [place]   07/07/2020, 5:59 PM

## 2020-07-20 ENCOUNTER — Ambulatory Visit: Payer: Self-pay

## 2020-07-23 ENCOUNTER — Other Ambulatory Visit: Payer: Self-pay | Admitting: Internal Medicine

## 2020-07-23 DIAGNOSIS — N529 Male erectile dysfunction, unspecified: Secondary | ICD-10-CM

## 2020-07-23 DIAGNOSIS — E785 Hyperlipidemia, unspecified: Secondary | ICD-10-CM

## 2020-08-13 ENCOUNTER — Other Ambulatory Visit: Payer: Self-pay | Admitting: Internal Medicine

## 2020-08-19 ENCOUNTER — Ambulatory Visit: Payer: Self-pay | Admitting: Internal Medicine

## 2020-09-09 ENCOUNTER — Other Ambulatory Visit: Payer: Self-pay | Admitting: Internal Medicine

## 2020-09-09 DIAGNOSIS — N529 Male erectile dysfunction, unspecified: Secondary | ICD-10-CM

## 2020-09-15 ENCOUNTER — Other Ambulatory Visit: Payer: Self-pay | Admitting: Family Medicine

## 2020-09-15 DIAGNOSIS — F5104 Psychophysiologic insomnia: Secondary | ICD-10-CM

## 2020-09-16 NOTE — Telephone Encounter (Signed)
Requested medication (s) are due for refill today: yes   Requested medication (s) are on the active medication list: yes   Last refill:  08/15/2020  Future visit scheduled: no  Notes to clinic:   This refill cannot be delegated  Requested Prescriptions  Pending Prescriptions Disp Refills   zolpidem (AMBIEN CR) 6.25 MG CR tablet [Pharmacy Med Name: ZOLPIDEM ER 6.25MG  TABLETS] 30 tablet     Sig: TAKE 1 TABLET BY MOUTH EVERY NIGHT AT BEDTIME      Not Delegated - Psychiatry:  Anxiolytics/Hypnotics Failed - 09/15/2020  7:26 PM      Failed - This refill cannot be delegated      Failed - Urine Drug Screen completed in last 360 days      Passed - Valid encounter within last 6 months    Recent Outpatient Visits           2 months ago Hyperlipidemia, unspecified hyperlipidemia type   [place], Enobong, MD   8 months ago Chronic insomnia   [place] [name], MD   1 year ago Chronic insomnia   [place] [name], MD   1 year ago Annual physical exam   [place] [name], MD   2 years ago Chronic insomnia   [place] [name], MD

## 2020-09-20 NOTE — Telephone Encounter (Signed)
Pt stated the refill for zolpidem (AMBIEN CR) 6.25 MG CR tablet still has been sent to his pharmacy. Pt asked that the request be sent today.

## 2020-11-15 ENCOUNTER — Other Ambulatory Visit: Payer: Self-pay | Admitting: Internal Medicine

## 2020-11-15 DIAGNOSIS — N529 Male erectile dysfunction, unspecified: Secondary | ICD-10-CM

## 2020-11-21 ENCOUNTER — Other Ambulatory Visit: Payer: Self-pay | Admitting: Internal Medicine

## 2020-11-21 DIAGNOSIS — F5104 Psychophysiologic insomnia: Secondary | ICD-10-CM

## 2020-11-21 NOTE — Telephone Encounter (Signed)
Requested medication (s) are due for refill today Yes  Requested medication (s) are on the active medication list Yes  Future visit scheduled No  Note to clinic-Patient reported no longer taking on 07/07/20. Routing to physician for review.   Requested Prescriptions  Pending Prescriptions Disp Refills   traZODone (DESYREL) 50 MG tablet [Pharmacy Med Name: TRAZODONE 50MG  TABLETS] 30 tablet 3    Sig: TAKE 1 TABLET BY MOUTH EVERY NIGHT AT BEDTIME AS NEEDED FOR SLEEP     Psychiatry: Antidepressants - Serotonin Modulator Passed - 11/21/2020  3:43 AM      Passed - Completed PHQ-2 or PHQ-9 in the last 360 days      Passed - Valid encounter within last 6 months    Recent Outpatient Visits           4 months ago Hyperlipidemia, unspecified hyperlipidemia type   [place], Enobong, MD   10 months ago Chronic insomnia   [place] [name], MD   1 year ago Chronic insomnia   [place] [name], MD   1 year ago Annual physical exam   [place] [name], MD   2 years ago Chronic insomnia   [place] [name], MD

## 2020-12-22 ENCOUNTER — Other Ambulatory Visit: Payer: Self-pay | Admitting: Internal Medicine

## 2020-12-22 DIAGNOSIS — N529 Male erectile dysfunction, unspecified: Secondary | ICD-10-CM

## 2020-12-22 NOTE — Telephone Encounter (Signed)
Requested Prescriptions  Pending Prescriptions Disp Refills  . sildenafil (VIAGRA) 100 MG tablet [Pharmacy Med Name: SILDENAFIL 100 MG TABLET] 30 tablet 0    Sig: TAKE 1/2 TO 1 TABLET BY MOUTH DAILY AS NEEDED     Urology: Erectile Dysfunction Agents Passed - 12/22/2020  9:19 AM      Passed - Last BP in normal range    BP Readings from Last 1 Encounters:  07/07/20 101/65         Passed - Valid encounter within last 12 months    Recent Outpatient Visits          5 months ago Hyperlipidemia, unspecified hyperlipidemia type   [place], Enobong, MD   11 months ago Chronic insomnia   [place] [name], MD   1 year ago Chronic insomnia   [place] [name], MD   1 year ago Annual physical exam   [place] [name], MD   2 years ago Chronic insomnia   [place] [name], MD

## 2021-04-09 ENCOUNTER — Other Ambulatory Visit: Payer: Self-pay | Admitting: Internal Medicine

## 2021-04-09 DIAGNOSIS — N529 Male erectile dysfunction, unspecified: Secondary | ICD-10-CM

## 2021-04-11 NOTE — Telephone Encounter (Signed)
Requested medication (s) are due for refill today:   Yes  Requested medication (s) are on the active medication list:   Yes  Future visit scheduled:   No   Last ordered: 12/22/2020 #30, 0 refills  Returned because labs work due so protocol critieria not met   Requested Prescriptions  Pending Prescriptions Disp Refills   sildenafil (VIAGRA) 100 MG tablet [Pharmacy Med Name: SILDENAFIL 100 MG TABLET] 30 tablet 0    Sig: TAKE 1/2 TO 1 TABLET BY MOUTH DAILY AS NEEDED     Urology: Erectile Dysfunction Agents Failed - 04/09/2021  4:36 PM      Failed - AST in normal range and within 360 days    AST  Date Value Ref Range Status  02/06/2019 12 0 - 40 IU/L Final          Failed - ALT in normal range and within 360 days    ALT  Date Value Ref Range Status  02/06/2019 8 0 - 44 IU/L Final          Passed - Last BP in normal range    BP Readings from Last 1 Encounters:  07/07/20 101/65          Passed - Valid encounter within last 12 months    Recent Outpatient Visits           9 months ago Hyperlipidemia, unspecified hyperlipidemia type   [place], Enobong, MD   1 year ago Chronic insomnia   [place], MD   1 year ago Chronic insomnia   [place] [name], MD   2 years ago Annual physical exam   [place], MD   2 years ago Chronic insomnia   [place] [name], MD
